# Patient Record
Sex: Female | Born: 1985
Health system: Southern US, Community
[De-identification: ages and names within clinical notes are randomized; demographics above are authoritative.]

## PROBLEM LIST (undated history)

## (undated) DIAGNOSIS — F32A Depression, unspecified: Secondary | ICD-10-CM

## (undated) DIAGNOSIS — M549 Dorsalgia, unspecified: Secondary | ICD-10-CM

## (undated) DIAGNOSIS — M7989 Other specified soft tissue disorders: Secondary | ICD-10-CM

## (undated) DIAGNOSIS — R87629 Unspecified abnormal cytological findings in specimens from vagina: Secondary | ICD-10-CM

## (undated) DIAGNOSIS — F419 Anxiety disorder, unspecified: Secondary | ICD-10-CM

## (undated) DIAGNOSIS — I10 Essential (primary) hypertension: Secondary | ICD-10-CM

## (undated) DIAGNOSIS — F329 Major depressive disorder, single episode, unspecified: Secondary | ICD-10-CM

## (undated) HISTORY — DX: Unspecified abnormal cytological findings in specimens from vagina: R87.629

## (undated) HISTORY — PX: LEEP: SHX91

## (undated) HISTORY — DX: Other specified soft tissue disorders: M79.89

## (undated) HISTORY — DX: Essential (primary) hypertension: I10

## (undated) HISTORY — DX: Dorsalgia, unspecified: M54.9

---
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
## 2014-06-18 ENCOUNTER — Ambulatory Visit (INDEPENDENT_AMBULATORY_CARE_PROVIDER_SITE_OTHER): Payer: BLUE CROSS/BLUE SHIELD | Admitting: Physician Assistant

## 2014-06-18 ENCOUNTER — Encounter: Payer: Self-pay | Admitting: Physician Assistant

## 2014-06-18 VITALS — BP 120/82 | HR 66 | Ht 66.0 in | Wt 235.0 lb

## 2014-06-18 DIAGNOSIS — Z131 Encounter for screening for diabetes mellitus: Secondary | ICD-10-CM | POA: Diagnosis not present

## 2014-06-18 DIAGNOSIS — Z0000 Encounter for general adult medical examination without abnormal findings: Secondary | ICD-10-CM | POA: Diagnosis not present

## 2014-06-18 DIAGNOSIS — D239 Other benign neoplasm of skin, unspecified: Secondary | ICD-10-CM | POA: Diagnosis not present

## 2014-06-18 DIAGNOSIS — Z13220 Encounter for screening for lipoid disorders: Secondary | ICD-10-CM

## 2014-06-18 DIAGNOSIS — L720 Epidermal cyst: Secondary | ICD-10-CM | POA: Insufficient documentation

## 2014-06-18 DIAGNOSIS — E669 Obesity, unspecified: Secondary | ICD-10-CM | POA: Insufficient documentation

## 2014-06-18 NOTE — Patient Instructions (Addendum)
Keeping You Healthy  Get These Tests 1. Blood Pressure- Have your blood pressure checked once a year by your health care provider.  Normal blood pressure is 120/80. 2. Weight- Have your body mass index (BMI) calculated to screen for obesity.  BMI is measure of body fat based on height and weight.  You can also calculate your own BMI at GravelBags.it. 3. Cholesterol- Have your cholesterol checked every 5 years starting at age 31 then yearly starting at age 63. 64. Chlamydia, HIV, and other sexually transmitted diseases- Get screened every year until age 74, then within three months of each new sexual provider. 5. Pap Smear- Every 1-3 years; discuss with your health care provider. 6. Mammogram- Every year starting at age 23  Take these medicines  Calcium with Vitamin D-Your body needs 1200 mg of Calcium each day and [phone] IU of Vitamin D daily.  Your body can only absorb 500 mg of Calcium at a time so Calcium must be taken in 2 or 3 divided doses throughout the day.  Multivitamin with folic acid- Once daily if it is possible for you to become pregnant.  Get these Immunizations  Gardasil-Series of three doses; prevents HPV related illness such as genital warts and cervical cancer.  Menactra-Single dose; prevents meningitis.  Tetanus shot- Every 10 years.  Flu shot-Every year.  Take these steps 1. Do not smoke-Your healthcare provider can help you quit.  For tips on how to quit go to www.smokefree.gov or call 1-800 QUITNOW. 2. Be physically active- Exercise 5 days a week for at least 30 minutes.  If you are not already physically active, start slow and gradually work up to 30 minutes of moderate physical activity.  Examples of moderate activity include walking briskly, dancing, swimming, bicycling, etc. 3. Breast Cancer- A self breast exam every month is important for early detection of breast cancer.  For more information and instruction on self breast exams, ask your  healthcare provider or https://www.patel.info/. 4. Eat a healthy diet- Eat a variety of healthy foods such as fruits, vegetables, whole grains, low fat milk, low fat cheeses, yogurt, lean meats, poultry and fish, beans, nuts, tofu, etc.  For more information go to www. Thenutritionsource.org 5. Drink alcohol in moderation- Limit alcohol intake to one drink or less per day. Never drink and drive. 6. Depression- Your emotional health is as important as your physical health.  If you're feeling down or losing interest in things you normally enjoy please talk to your healthcare provider about being screened for depression. 7. Dental visit- Brush and floss your teeth twice daily; visit your dentist twice a year. 8. Eye doctor- Get an eye exam at least every 2 years. 9. Helmet use- Always wear a helmet when riding a bicycle, motorcycle, rollerblading or skateboarding. 52. Safe sex- If you may be exposed to sexually transmitted infections, use a condom. 11. Seat belts- Seat belts can save your live; always wear one. 12. Smoke/Carbon Monoxide detectors- These detectors need to be installed on the appropriate level of your home. Replace batteries at least once a year. 13. Skin cancer- When out in the sun please cover up and use sunscreen 15 SPF or higher. 14. Violence- If anyone is threatening or hurting you, please tell your healthcare provider.         Epidermal Cyst An epidermal cyst is sometimes called a sebaceous cyst, epidermal inclusion cyst, or infundibular cyst. These cysts usually contain a substance that looks "pasty" or "cheesy" and may have a  bad smell. This substance is a protein called keratin. Epidermal cysts are usually found on the face, neck, or trunk. They may also occur in the vaginal area or other parts of the genitalia of both men and women. Epidermal cysts are usually small, painless, slow-growing bumps or lumps that move freely under the skin. It is important  not to try to pop them. This may cause an infection and lead to tenderness and swelling. CAUSES  Epidermal cysts may be caused by a deep penetrating injury to the skin or a plugged hair follicle, often associated with acne. SYMPTOMS  Epidermal cysts can become inflamed and cause:  Redness.  Tenderness.  Increased temperature of the skin over the bumps or lumps.  Grayish-white, bad smelling material that drains from the bump or lump. DIAGNOSIS  Epidermal cysts are easily diagnosed by your caregiver during an exam. Rarely, a tissue sample (biopsy) may be taken to rule out other conditions that may resemble epidermal cysts. TREATMENT  7. Epidermal cysts often get better and disappear on their own. They are rarely ever cancerous. 8. If a cyst becomes infected, it may become inflamed and tender. This may require opening and draining the cyst. Treatment with antibiotics may be necessary. When the infection is gone, the cyst may be removed with minor surgery. 9. Small, inflamed cysts can often be treated with antibiotics or by injecting steroid medicines. 10. Sometimes, epidermal cysts become large and bothersome. If this happens, surgical removal in your caregiver's office may be necessary. HOME CARE INSTRUCTIONS 3. Only take over-the-counter or prescription medicines as directed by your caregiver. 4. Take your antibiotics as directed. Finish them even if you start to feel better. SEEK MEDICAL CARE IF:  5. Your cyst becomes tender, red, or swollen. 6. Your condition is not improving or is getting worse. 7. You have any other questions or concerns. MAKE SURE YOU:  Understand these instructions.  Will watch your condition.  Will get help right away if you are not doing well or get worse. Document Released: 03/07/2004 Document Revised: 06/29/2011 Document Reviewed: 10/13/2010 [employer] Patient Information 2015 [place], [place]. This information is not intended to replace advice given to you  by your health care provider. Make sure you discuss any questions you have with your health care provider.

## 2014-06-19 ENCOUNTER — Encounter: Payer: Self-pay | Admitting: Physician Assistant

## 2014-06-19 DIAGNOSIS — D239 Other benign neoplasm of skin, unspecified: Secondary | ICD-10-CM | POA: Insufficient documentation

## 2014-06-19 NOTE — Progress Notes (Signed)
  Subjective:     Janet Clayton is a 29 y.o. female and is here for a comprehensive physical exam. The patient reports problems - she does have a lump on the back. no pain or discomfort just noticed it. she does also have a dark spot on left calff she wonders about. Marland Kitchen  History   Social History  . Marital Status: Unknown    Spouse Name: N/A  . Number of Children: N/A  . Years of Education: N/A   Occupational History  . Not on file.   Social History Main Topics  . Smoking status: Not on file  . Smokeless tobacco: Not on file  . Alcohol Use: 0.0 oz/week    0 Standard drinks or equivalent per week  . Drug Use: No  . Sexual Activity: Not Currently   Other Topics Concern  . Not on file   Social History Narrative  . No narrative on file   Health Maintenance  Topic Date Due  . HIV Screening  03/24/1999  . INFLUENZA VACCINE  12/17/2014 (Originally 11/18/2013)  . PAP SMEAR  04/21/2015  . TETANUS/TDAP  04/20/2021    The following portions of the patient's history were reviewed and updated as appropriate: allergies, current medications, past family history, past medical history, past social history, past surgical history and problem list.  Review of Systems A comprehensive review of systems was negative.   Objective:    BP 120/82 mmHg  Pulse 66  Ht 5\' 6"  (1.676 m)  Wt 235 lb (106.595 kg)  BMI 37.95 kg/m2  LMP 05/30/2014 General appearance: alert, cooperative and appears stated age Head: Normocephalic, without obvious abnormality, atraumatic Eyes: conjunctivae/corneas clear. PERRL, EOM's intact. Fundi benign. Ears: normal TM's and external ear canals both ears Nose: Nares normal. Septum midline. Mucosa normal. No drainage or sinus tenderness. Throat: lips, mucosa, and tongue normal; teeth and gums normal Neck: no adenopathy, no carotid bruit, no JVD, supple, symmetrical, trachea midline and thyroid not enlarged, symmetric, no tenderness/mass/nodules Back: symmetric, no  curvature. ROM normal. No CVA tenderness. Lungs: clear to auscultation bilaterally Heart: regular rate and rhythm, S1, S2 normal, no murmur, click, rub or gallop Abdomen: soft, non-tender; bowel sounds normal; no masses,  no organomegaly Extremities: extremities normal, atraumatic, no cyanosis or edema Pulses: 2+ and symmetric Skin: Skin color, texture, turgor normal. No rashes or lesions small macule purplish color with positive dimple sign. 17mm by 41mm epidermal cyst Lymph nodes: Cervical, supraclavicular, and axillary nodes normal. Neurologic: Grossly normal    Assessment:    Healthy female exam.      Plan:    CPE- fasting labs ordered and formed filled out. Vaccines up to date. Pap smear done at GYN. Discussed exercise and healthy diet. calicum 1200mg  and vitamin d 800units. HO given. Pt is obese she is actively losing weight. Discussed follow up visit if would like to start medication.   Dermatofibroma- discussed left calf lesion. Not harmful. No treatment necessary.   Epidermal cyst- can remove at future appt. Follow up as needed. HO given. Not harmful.  See After Visit Summary for Counseling Recommendations

## 2014-06-22 ENCOUNTER — Encounter: Payer: Self-pay | Admitting: Physician Assistant

## 2014-06-22 DIAGNOSIS — E785 Hyperlipidemia, unspecified: Secondary | ICD-10-CM | POA: Insufficient documentation

## 2014-06-22 LAB — HIV ANTIBODY (ROUTINE TESTING W REFLEX): HIV 1&2 Ab, 4th Generation: NONREACTIVE

## 2014-06-22 LAB — COMPLETE METABOLIC PANEL WITH GFR
ALK PHOS: 80 U/L (ref 39–117)
ALT: 14 U/L (ref 0–35)
AST: 19 U/L (ref 0–37)
Albumin: 4 g/dL (ref 3.5–5.2)
BUN: 9 mg/dL (ref 6–23)
CO2: 25 mEq/L (ref 19–32)
Calcium: 8.9 mg/dL (ref 8.4–10.5)
Chloride: 108 mEq/L (ref 96–112)
Creat: 0.65 mg/dL (ref 0.50–1.10)
GFR, Est African American: >89 mL/min
GFR, Est Non African American: >89 mL/min
Glucose, Bld: 92 mg/dL (ref 70–99)
POTASSIUM: 4.4 meq/L (ref 3.5–5.3)
SODIUM: 140 meq/L (ref 135–145)
TOTAL PROTEIN: 6.3 g/dL (ref 6.0–8.3)
Total Bilirubin: 0.5 mg/dL (ref 0.2–1.2)

## 2014-06-22 LAB — LIPID PANEL
CHOL/HDL RATIO: 4.4 ratio
CHOLESTEROL: 194 mg/dL (ref 0–200)
HDL: 44 mg/dL — ABNORMAL LOW (ref >=46)
LDL CALC: 129 mg/dL — AB (ref 0–99)
Triglycerides: 106 mg/dL (ref <150)
VLDL: 21 mg/dL (ref 0–40)

## 2014-06-25 ENCOUNTER — Encounter: Payer: Self-pay | Admitting: *Deleted

## 2014-06-25 ENCOUNTER — Encounter: Payer: Self-pay | Admitting: Physician Assistant

## 2014-07-08 ENCOUNTER — Encounter: Payer: Self-pay | Admitting: Emergency Medicine

## 2014-07-08 ENCOUNTER — Emergency Department (INDEPENDENT_AMBULATORY_CARE_PROVIDER_SITE_OTHER)
Admission: EM | Admit: 2014-07-08 | Discharge: 2014-07-08 | Disposition: A | Discharge status: Home / Self Care | Payer: BLUE CROSS/BLUE SHIELD | Source: Home / Self Care | Attending: Emergency Medicine | Admitting: Emergency Medicine

## 2014-07-08 DIAGNOSIS — N3001 Acute cystitis with hematuria: Secondary | ICD-10-CM | POA: Diagnosis not present

## 2014-07-08 LAB — POCT URINALYSIS DIP (MANUAL ENTRY)
Bilirubin, UA: NEGATIVE
Glucose, UA: 100
Ketones, POC UA: NEGATIVE
Nitrite, UA: POSITIVE
Protein Ur, POC: NEGATIVE
Spec Grav, UA: 1.015 (ref 1.005–1.03)
Urobilinogen, UA: 1 (ref 0–1)
pH, UA: 6.5 (ref 5–8)

## 2014-07-08 MED ORDER — PHENAZOPYRIDINE HCL 200 MG PO TABS: ORAL_TABLET | ORAL | Status: DC

## 2014-07-08 MED ORDER — CIPROFLOXACIN HCL 500 MG PO TABS: 500.0000 mg | ORAL_TABLET | Freq: Two times a day (BID) | ORAL | Status: DC

## 2014-07-08 NOTE — ED Provider Notes (Signed)
CSN: [id number]     Arrival date & time 07/08/14  1109 History   First MD Initiated Contact with Patient 07/08/14 1118     Chief Complaint  Patient presents with  . Dysuria   (Consider location/radiation/quality/duration/timing/severity/associated sxs/prior Treatment) HPI This is a 29 y.o. female who presents today with UTI symptoms for 2 days.  + dysuria + frequency + urgency No hematuria No vaginal discharge No fever/chills Mild suprapubic lower abdominal pain No nausea No vomiting No back pain No fatigue She denies chance of pregnancy.--LMP 07/02/2014 Has tried over-the-counter measures without improvement.    History reviewed. No pertinent past medical history. History reviewed. No pertinent past surgical history. History reviewed. No pertinent family history. History  Substance Use Topics  . Smoking status: Never Smoker   . Smokeless tobacco: Not on file  . Alcohol Use: 0.0 oz/week    0 Standard drinks or equivalent per week   OB History    No data available     Review of Systems  All other systems reviewed and are negative.   Allergies  Augmentin; Codeine; and Sulfa antibiotics  Home Medications   Prior to Admission medications   Medication Sig Start Date End Date Taking? Authorizing Provider  ciprofloxacin (CIPRO) 500 MG tablet Take 1 tablet (500 mg total) by mouth 2 (two) times daily. For 7 days 07/08/14   Jacqulyn Cane, MD  phenazopyridine (PYRIDIUM) 200 MG tablet Take 1 tablet by mouth every 6-8 hours if needed for urinary pain 07/08/14   Jacqulyn Cane, MD   BP 116/78 mmHg  Pulse 75  Temp(Src) 98 F (36.7 C) (Oral)  Resp 16  Ht 5\' 6"  (1.676 m)  Wt 231 lb (104.781 kg)  BMI 37.30 kg/m2  SpO2 98%  LMP 05/30/2014 Physical Exam  Constitutional: She is oriented to person, place, and time. She appears well-developed and well-nourished. No distress.  HENT:  Mouth/Throat: Oropharynx is clear and moist.  Eyes: No scleral icterus.  Neck: Neck supple.   Cardiovascular: Normal rate, regular rhythm and normal heart sounds.   Pulmonary/Chest: Breath sounds normal.  Abdominal: Soft. She exhibits no mass. There is no hepatosplenomegaly. There is tenderness in the suprapubic area. There is no rebound, no guarding and no CVA tenderness.  Lymphadenopathy:    She has no cervical adenopathy.  Neurological: She is alert and oriented to person, place, and time.  Skin: Skin is warm and dry.  Nursing note and vitals reviewed.   ED Course  Procedures (including critical care time) Labs Review Labs Reviewed  URINE CULTURE  POCT URINALYSIS DIP (MANUAL ENTRY)   Results for orders placed or performed during the hospital encounter of 07/08/14  POCT urinalysis dipstick (new)  Result Value Ref Range   Color, UA orange    Clarity, UA clear    Glucose, UA =100    Bilirubin, UA negative    Bilirubin, UA negative    Spec Grav, UA 1.015 1.005 - 1.03   Blood, UA trace-intact    pH, UA 6.5 5 - 8   Protein Ur, POC negative    Urobilinogen, UA 1.0 0 - 1   Nitrite, UA Positive    Leukocytes, UA large (3+)      Imaging Review No results found.   MDM   1. Acute cystitis with hematuria    ciprofloxacin (CIPRO) 500 MG tablet Take 1 tablet (500 mg total) by mouth 2 (two) times daily. For 7 days 14 tablet    phenazopyridine (PYRIDIUM) 200 MG tablet  Take 1 tablet by mouth every 6-8 hours if needed for urinary pain   Other symptomatic care discussed. Urine culture sent. Follow-up with your primary care doctor in 5-7 days if not improving, or sooner if symptoms become worse. Precautions discussed. Red flags discussed. Questions invited and answered. Patient voiced understanding and agreement.      Jacqulyn Cane, MD 07/08/14 1225

## 2014-07-08 NOTE — ED Notes (Signed)
Reports onset dysuria and frequency 2 days ago. No OTCs.

## 2014-07-09 LAB — URINE CULTURE

## 2014-08-17 ENCOUNTER — Emergency Department (INDEPENDENT_AMBULATORY_CARE_PROVIDER_SITE_OTHER)
Admission: EM | Admit: 2014-08-17 | Discharge: 2014-08-17 | Disposition: A | Discharge status: Home / Self Care | Payer: BLUE CROSS/BLUE SHIELD | Source: Home / Self Care | Attending: Family Medicine | Admitting: Family Medicine

## 2014-08-17 ENCOUNTER — Encounter: Payer: Self-pay | Admitting: *Deleted

## 2014-08-17 DIAGNOSIS — J069 Acute upper respiratory infection, unspecified: Secondary | ICD-10-CM | POA: Diagnosis not present

## 2014-08-17 DIAGNOSIS — B9789 Other viral agents as the cause of diseases classified elsewhere: Principal | ICD-10-CM

## 2014-08-17 MED ORDER — BENZONATATE 200 MG PO CAPS: 200.0000 mg | ORAL_CAPSULE | Freq: Every day | ORAL | Status: DC

## 2014-08-17 MED ORDER — AMOXICILLIN 875 MG PO TABS: 875.0000 mg | ORAL_TABLET | Freq: Two times a day (BID) | ORAL | Status: DC

## 2014-08-17 NOTE — ED Provider Notes (Signed)
CSN: [id number]     Arrival date & time 08/17/14  1208 History   First MD Initiated Contact with Patient 08/17/14 1223     Chief Complaint  Patient presents with  . Sinus Problem     HPI Comments: Patient complains of four day history of typical cold-like symptoms including mild sore throat, sinus congestion, headache, fatigue, and cough.  No fevers, chills, and sweats.  Her ears have felt clogged but no earache.  The history is provided by the patient.    History reviewed. No pertinent past medical history. History reviewed. No pertinent past surgical history. History reviewed. No pertinent family history. History  Substance Use Topics  . Smoking status: Never Smoker   . Smokeless tobacco: Not on file  . Alcohol Use: 0.0 oz/week    0 Standard drinks or equivalent per week   OB History    No data available     Review of Systems + sore throat + hoarse + cough No pleuritic pain No wheezing + nasal congestion + post-nasal drainage ? sinus pain/pressure No itchy/red eyes No earache No hemoptysis No SOB No fever/chills No nausea No vomiting No abdominal pain No diarrhea No urinary symptoms No skin rash + fatigue + mild myalgias + headache Used OTC meds without relief  Allergies  Augmentin; Codeine; and Sulfa antibiotics  Home Medications   Prior to Admission medications   Medication Sig Start Date End Date Taking? Authorizing Provider  amoxicillin (AMOXIL) 875 MG tablet Take 1 tablet (875 mg total) by mouth 2 (two) times daily. (Rx void after 08/25/14) 08/17/14   Kandra Nicolas, MD  benzonatate (TESSALON) 200 MG capsule Take 1 capsule (200 mg total) by mouth at bedtime. Take as needed for cough 08/17/14   Kandra Nicolas, MD   BP 122/85 mmHg  Pulse 78  Temp(Src) 97.6 F (36.4 C) (Oral)  Resp 14  Wt 229 lb (103.874 kg)  SpO2 99%  LMP 07/27/2014 Physical Exam Nursing notes and Vital Signs reviewed. Appearance:  Patient appears stated age, and in no acute  distress Eyes:  Pupils are equal, round, and reactive to light and accomodation.  Extraocular movement is intact.  Conjunctivae are not inflamed  Ears:   Right canal and tympanic membrane normal.  Left canal occluded with cerumen.  Nose:  Mildly congested turbinates.  No sinus tenderness.     Pharynx:  Normal Neck:  Supple.  Tender enlarged posterior nodes are palpated bilaterally  Lungs:  Clear to auscultation.  Breath sounds are equal.  Heart:  Regular rate and rhythm without murmurs, rubs, or gallops.  Abdomen:  Nontender without masses or hepatosplenomegaly.  Bowel sounds are present.  No CVA or flank tenderness.  Extremities:  No edema.  No calf tenderness Skin:  No rash present.   ED Course  Procedures  none   MDM   1. Viral URI with cough    There is no evidence of bacterial infection today.  Prescription written for Benzonatate [facility]) to take at bedtime for night-time cough.  Take plain guaifenesin (1200mg  extended release tabs such as Mucinex) twice daily, with plenty of water, for cough and congestion.  May add Pseudoephedrine (30mg , one or two every 4 to 6 hours) for sinus congestion.  Get adequate rest.   May use Afrin nasal spray (or generic oxymetazoline) twice daily for about 5 days.  Also recommend using saline nasal spray several times daily and saline nasal irrigation (AYR is a common brand).  Try warm salt  water gargles for sore throat.  Stop all antihistamines for now, and other non-prescription cough/cold preparations. Begin Amoxicillin if not improving about one week or if persistent fever develops (Given a prescription to hold, with an expiration date)  Given information on cerumen removal. Follow-up with family doctor if not improving about10 days    Kandra Nicolas, MD 08/17/14 1300

## 2014-08-17 NOTE — ED Notes (Signed)
Pt c/o sinus drainage, congestion with green mucous and pressure on her teeth and ears x 4 days. Denies fever.

## 2014-08-17 NOTE — Discharge Instructions (Signed)
Take plain guaifenesin (1200mg  extended release tabs such as Mucinex) twice daily, with plenty of water, for cough and congestion.  May add Pseudoephedrine (30mg , one or two every 4 to 6 hours) for sinus congestion.  Get adequate rest.   May use Afrin nasal spray (or generic oxymetazoline) twice daily for about 5 days.  Also recommend using saline nasal spray several times daily and saline nasal irrigation (AYR is a common brand).  Try warm salt water gargles for sore throat.  Stop all antihistamines for now, and other non-prescription cough/cold preparations. Begin Amoxicillin if not improving about one week or if persistent fever develops  Follow-up with family doctor if not improving about10 days.

## 2014-08-18 ENCOUNTER — Emergency Department (INDEPENDENT_AMBULATORY_CARE_PROVIDER_SITE_OTHER)
Admission: EM | Admit: 2014-08-18 | Discharge: 2014-08-18 | Disposition: A | Discharge status: Home / Self Care | Payer: BLUE CROSS/BLUE SHIELD | Source: Home / Self Care | Attending: Emergency Medicine | Admitting: Emergency Medicine

## 2014-08-18 ENCOUNTER — Encounter: Payer: Self-pay | Admitting: *Deleted

## 2014-08-18 DIAGNOSIS — H6122 Impacted cerumen, left ear: Secondary | ICD-10-CM

## 2014-08-18 DIAGNOSIS — H918X2 Other specified hearing loss, left ear: Secondary | ICD-10-CM | POA: Diagnosis not present

## 2014-08-18 DIAGNOSIS — H6092 Unspecified otitis externa, left ear: Secondary | ICD-10-CM

## 2014-08-18 MED ORDER — NEOMYCIN-POLYMYXIN-HC 3.5-10000-1 OT SUSP: 4.0000 [drp] | Freq: Three times a day (TID) | OTIC | Status: DC

## 2014-08-18 NOTE — ED Provider Notes (Signed)
CSN: [id number]     Arrival date & time 08/18/14  1506 History   First MD Initiated Contact with Patient 08/18/14 1518     Chief Complaint  Patient presents with  . Ear Fullness    L    HPI At about 6:30 AM this morning, she was cleaning left ear with Q-tip and felt moderate sharp pain and pressure and decreased hearing. No drainage or bleeding. No fever. No radiation of pain. History reviewed. No pertinent past medical history. History reviewed. No pertinent past surgical history. History reviewed. No pertinent family history. History  Substance Use Topics  . Smoking status: Never Smoker   . Smokeless tobacco: Not on file  . Alcohol Use: 0.0 oz/week    0 Standard drinks or equivalent per week   OB History    No data available     Review of Systems Remainder of Review of Systems negative for acute change except as noted in the HPI.  Allergies  Augmentin; Codeine; and Sulfa antibiotics  Home Medications   Prior to Admission medications   Medication Sig Start Date End Date Taking? Authorizing Provider  amoxicillin (AMOXIL) 875 MG tablet Take 1 tablet (875 mg total) by mouth 2 (two) times daily. (Rx void after 08/25/14) 08/17/14   Kandra Nicolas, MD  benzonatate (TESSALON) 200 MG capsule Take 1 capsule (200 mg total) by mouth at bedtime. Take as needed for cough 08/17/14   Kandra Nicolas, MD  neomycin-polymyxin-hydrocortisone (CORTISPORIN) 3.5-10000-1 otic suspension Place 4 drops into the left ear 3 (three) times daily. 08/18/14   Jacqulyn Cane, MD   BP 121/79 mmHg  Pulse 88  Temp(Src) 97.3 F (36.3 C) (Oral)  Ht 5\' 6"  (1.676 m)  Wt 230 lb (104.327 kg)  BMI 37.14 kg/m2  SpO2 99%  LMP 07/27/2014 Physical Exam  Constitutional: She is oriented to person, place, and time. She appears well-developed and well-nourished. No distress.  HENT:  Head: Normocephalic and atraumatic.  Eyes: Conjunctivae and EOM are normal. Pupils are equal, round, and reactive to light. No scleral  icterus.  Neck: Normal range of motion.  Cardiovascular: Normal rate.   Pulmonary/Chest: Effort normal.  Abdominal: She exhibits no distension.  Musculoskeletal: Normal range of motion.  Neurological: She is alert and oriented to person, place, and time.  Skin: Skin is warm.  Psychiatric: She has a normal mood and affect.  Nursing note and vitals reviewed.  Left external ear mildly tender without redness or heat. Left external ear canal: Dense left cerumen impaction.  Right ear normal. ED Course  Procedures (including critical care time) Labs Review Labs Reviewed - No data to display  Imaging Review No results found.   MDM   1. Hearing loss of left ear due to cerumen impaction   2. Left otitis externa    For the left cerumen impaction, the following procedure performed after risks benefits alternatives discussed. Left ear canal soaked with peroxide for 5 minutes. Irrigation and curettage of left earcanal, with successful removal of large amount of impacted wax. Afterward, her pain was significantly relieved and she could hear normally and she expressed her thanks for this.  For the left otitis externa: Reexamined left ear canal which is now patent. Mild to moderate redness and injection of left ear canal. TM intact, within normal limits. I prescribed Corticosporin otic drops left ear for the left otitis externa.  Other advice given regarding earwax and safe ways to clean wax out of ears without using Q-tips. Follow-up  with your primary care doctor or ENT prn. Precautions discussed. Red flags discussed. Questions invited and answered. Patient voiced understanding and agreement.     Jacqulyn Cane, MD 08/18/14 [id number]

## 2014-08-18 NOTE — ED Notes (Signed)
Pt states she was cleaning her L ear this morning and now it feels clogged and has limited hearing in the ear.  6/10 pain.

## 2014-08-19 ENCOUNTER — Telehealth: Payer: Self-pay | Admitting: Emergency Medicine

## 2014-08-19 NOTE — ED Notes (Signed)
Inquired about patient's status; encourage them to call with questions/concerns.  

## 2014-11-14 ENCOUNTER — Emergency Department
Admission: EM | Admit: 2014-11-14 | Discharge: 2014-11-14 | Disposition: A | Discharge status: Home / Self Care | Payer: BLUE CROSS/BLUE SHIELD | Source: Home / Self Care | Attending: Family Medicine | Admitting: Family Medicine

## 2014-11-14 ENCOUNTER — Encounter: Payer: Self-pay | Admitting: *Deleted

## 2014-11-14 DIAGNOSIS — J069 Acute upper respiratory infection, unspecified: Secondary | ICD-10-CM | POA: Diagnosis not present

## 2014-11-14 DIAGNOSIS — H65193 Other acute nonsuppurative otitis media, bilateral: Secondary | ICD-10-CM | POA: Diagnosis not present

## 2014-11-14 DIAGNOSIS — B9789 Other viral agents as the cause of diseases classified elsewhere: Principal | ICD-10-CM

## 2014-11-14 MED ORDER — PREDNISONE 20 MG PO TABS: 20.0000 mg | ORAL_TABLET | Freq: Two times a day (BID) | ORAL | Status: DC

## 2014-11-14 MED ORDER — TRIAMCINOLONE ACETONIDE 40 MG/ML IJ SUSP
40.0000 mg | Freq: Once | INTRAMUSCULAR | Status: AC
  Administered 2014-11-14: 40 mg via INTRAMUSCULAR

## 2014-11-14 MED ORDER — HYDROCOD POLST-CPM POLST ER 10-8 MG/5ML PO SUER: ORAL | Status: DC

## 2014-11-14 MED ORDER — AMOXICILLIN 875 MG PO TABS: 875.0000 mg | ORAL_TABLET | Freq: Two times a day (BID) | ORAL | Status: DC

## 2014-11-14 NOTE — ED Provider Notes (Signed)
CSN: [id number]     Arrival date & time 11/14/14  1742 History   First MD Initiated Contact with Patient 11/14/14 1828     Chief Complaint  Patient presents with  . Facial Pain      HPI Comments: Patient states that she developed nasal congestion about one week ago, followed by cough three days ago.  She has had headache, fatigue, and tightness in her anterior chest but no fever or sore throat.  She has now developed mild bilateral earache, worse on the left.    The history is provided by the patient.    History reviewed. No pertinent past medical history. History reviewed. No pertinent past surgical history. History reviewed. No pertinent family history. History  Substance Use Topics  . Smoking status: Never Smoker   . Smokeless tobacco: Not on file  . Alcohol Use: 0.0 oz/week    0 Standard drinks or equivalent per week   OB History    No data available     Review of Systems No sore throat + hoarse + cough No pleuritic pain + wheezing + nasal congestion + post-nasal drainage No sinus pain/pressure No itchy/red eyes + earache No hemoptysis No SOB No fever/chills No nausea No vomiting No abdominal pain No diarrhea No urinary symptoms No skin rash + fatigue No myalgias + headache Used OTC meds without relief  Allergies  Augmentin; Codeine; and Sulfa antibiotics  Home Medications   Prior to Admission medications   Medication Sig Start Date End Date Taking? Authorizing Provider  dextromethorphan-guaiFENesin (MUCINEX DM) 30-600 MG per 12 hr tablet Take 1 tablet by mouth 2 (two) times daily.   Yes Historical Provider, MD  amoxicillin (AMOXIL) 875 MG tablet Take 1 tablet (875 mg total) by mouth 2 (two) times daily. 11/14/14   Kandra Nicolas, MD  chlorpheniramine-HYDROcodone [facility] PENNKINETIC ER) 10-8 MG/5ML SUER Take 46mL by mouth HS prn cough 11/14/14   Kandra Nicolas, MD  predniSONE (DELTASONE) 20 MG tablet Take 1 tablet (20 mg total) by mouth 2 (two) times  daily. Take with food. 11/14/14   Kandra Nicolas, MD   BP 129/89 mmHg  Pulse 73  Temp(Src) 98.3 F (36.8 C) (Oral)  Ht 5\' 6"  (1.676 m)  Wt 232 lb (105.235 kg)  BMI 37.46 kg/m2  SpO2 97%  LMP 11/08/2014 (Exact Date) Physical Exam Nursing notes and Vital Signs reviewed. Appearance:  Patient appears stated age, and in no acute distress.  Patient is obese (BMI 37.5) Eyes:  Pupils are equal, round, and reactive to light and accomodation.  Extraocular movement is intact.  Conjunctivae are not inflamed  Ears:  Canals normal.  Tympanic membranes normal although left tympanic membrane appears retracted. Nose:  Mildly congested turbinates.  No sinus tenderness.   Pharynx:  Normal Neck:  Supple.   Tender enlarged posterior nodes are palpated bilaterally  Lungs:  Clear to auscultation.  Breath sounds are equal.  Moving air well. Heart:  Regular rate and rhythm without murmurs, rubs, or gallops.  Abdomen:  Nontender without masses or hepatosplenomegaly.  Bowel sounds are present.  No CVA or flank tenderness.  Extremities:  No edema.  No calf tenderness Skin:  No rash present.   ED Course  Procedures  None    Labs Reviewed -  Tympanogram:  Left ear Low Peak Height (almost "flat line"), Right ear wide tympanogram       MDM   1. Viral URI with cough   2. Acute nonsuppurative otitis media  of both ears    Begin prednisone burst.  Rx for amoxicillin 875mg  BID for 10 days (has had no adverse effects in past) Rx for Tussionex prn bedtime cough (has taken hydrocodone without adverse effects) Take plain guaifenesin (1200mg  extended release tabs such as Mucinex) twice daily, with plenty of water, for cough and congestion.  May add Pseudoephedrine (30mg , one or two every 4 to 6 hours) for sinus congestion.  Get adequate rest.   May use Afrin nasal spray (or generic oxymetazoline) twice daily for about 5 days.  Also recommend using saline nasal spray several times daily and saline nasal irrigation  (AYR is a common brand).  Use Flonase nasal spray each morning after using Afrin nasal spray and saline nasal irrigation. Try warm salt water gargles for sore throat.  Stop all antihistamines for now, and other non-prescription cough/cold preparations. May take Ibuprofen 200mg , 4 tabs every 8 hours with food for earache, headache, etc.   Follow-up with Ear, Nose, Throat physician if not improved 10 days    Kandra Nicolas, MD 11/17/14 [id number]

## 2014-11-14 NOTE — ED Notes (Signed)
Pt c/o sinus pressure, pain, nasal congestion, hoarseness, bilateral ear pain, prn cough.

## 2014-11-14 NOTE — Discharge Instructions (Signed)
Take plain guaifenesin (1200mg  extended release tabs such as Mucinex) twice daily, with plenty of water, for cough and congestion.  May add Pseudoephedrine (30mg , one or two every 4 to 6 hours) for sinus congestion.  Get adequate rest.   May use Afrin nasal spray (or generic oxymetazoline) twice daily for about 5 days.  Also recommend using saline nasal spray several times daily and saline nasal irrigation (AYR is a common brand).  Use Flonase nasal spray each morning after using Afrin nasal spray and saline nasal irrigation. Try warm salt water gargles for sore throat.  Stop all antihistamines for now, and other non-prescription cough/cold preparations. May take Ibuprofen 200mg , 4 tabs every 8 hours with food for earache, headache, etc.   Follow-up with Ear, Nose, Throat physician if not improved 10 days

## 2014-12-31 ENCOUNTER — Ambulatory Visit (INDEPENDENT_AMBULATORY_CARE_PROVIDER_SITE_OTHER): Payer: BLUE CROSS/BLUE SHIELD | Admitting: Physician Assistant

## 2014-12-31 ENCOUNTER — Encounter: Payer: Self-pay | Admitting: Physician Assistant

## 2014-12-31 VITALS — BP 140/83 | HR 82 | Ht 66.0 in | Wt 231.0 lb

## 2014-12-31 DIAGNOSIS — L720 Epidermal cyst: Secondary | ICD-10-CM | POA: Diagnosis not present

## 2014-12-31 NOTE — Progress Notes (Signed)
   Subjective:    Patient ID: Janet Clayton, female    DOB: 11-14-1985, 29 y.o.   MRN: [id number]  HPI  Pt has had an epidermal cyst on back for last 10 years. The last couple of months has started to get bigger. No pain or drainage. When it first came up looked like a black head. She would like removed today.    Review of Systems  All other systems reviewed and are negative.      Objective:   Physical Exam  Constitutional: She appears well-developed and well-nourished.  Skin:  60mm by 43mm subcutaneous nodule with central puntate hole.          Assessment & Plan:  Epidermal cyst-   Sebaceous Cyst Excision Procedure Note  Pre-operative Diagnosis: Epidermal cyst  Post-operative Diagnosis: same  Locations:middle, upper, back    Indications: growing  Anesthesia: Lidocaine 1% without epinephrine without added sodium bicarbonate  Procedure Details  History of allergy to iodine: no  Patient informed of the risks (including bleeding and infection) and benefits of the  procedure and Verbal informed consent obtained.  The lesion and surrounding area was given a sterile prep using betadyne and draped in the usual sterile fashion. An incision was made over the cyst, which was dissected free of the surrounding tissue and removed.  The cyst was filled with typical sebaceous material.  The wound was closed with 4-0 Prolene using simple interrupted stitches. Antibiotic ointment and a sterile dressing applied.  The specimen was not sent for pathologic examination. The patient tolerated the procedure well.  EBL: scant  Condition:  Stable pt did experience some nausea she was given in office zofran 8mg  ODT.  Complications: none.  Plan: 1. Instructed to keep the wound dry and covered for 24-48h and clean thereafter. 2. Warning signs of infection were reviewed.   3. Recommended that the patient use OTC acetaminophen as needed for pain.  4. Return for suture removal in 10 days.

## 2014-12-31 NOTE — Patient Instructions (Signed)
Epidermal Cyst An epidermal cyst is sometimes called a sebaceous cyst, epidermal inclusion cyst, or infundibular cyst. These cysts usually contain a substance that looks "pasty" or "cheesy" and may have a bad smell. This substance is a protein called keratin. Epidermal cysts are usually found on the face, neck, or trunk. They may also occur in the vaginal area or other parts of the genitalia of both men and women. Epidermal cysts are usually small, painless, slow-growing bumps or lumps that move freely under the skin. It is important not to try to pop them. This may cause an infection and lead to tenderness and swelling. CAUSES  Epidermal cysts may be caused by a deep penetrating injury to the skin or a plugged hair follicle, often associated with acne. SYMPTOMS  Epidermal cysts can become inflamed and cause:  Redness.  Tenderness.  Increased temperature of the skin over the bumps or lumps.  Grayish-white, bad smelling material that drains from the bump or lump. DIAGNOSIS  Epidermal cysts are easily diagnosed by your caregiver during an exam. Rarely, a tissue sample (biopsy) may be taken to rule out other conditions that may resemble epidermal cysts. TREATMENT   Epidermal cysts often get better and disappear on their own. They are rarely ever cancerous.  If a cyst becomes infected, it may become inflamed and tender. This may require opening and draining the cyst. Treatment with antibiotics may be necessary. When the infection is gone, the cyst may be removed with minor surgery.  Small, inflamed cysts can often be treated with antibiotics or by injecting steroid medicines.  Sometimes, epidermal cysts become large and bothersome. If this happens, surgical removal in your caregiver's office may be necessary. HOME CARE INSTRUCTIONS  Only take over-the-counter or prescription medicines as directed by your caregiver.  Take your antibiotics as directed. Finish them even if you start to feel  better. SEEK MEDICAL CARE IF:   Your cyst becomes tender, red, or swollen.  Your condition is not improving or is getting worse.  You have any other questions or concerns. MAKE SURE YOU:  Understand these instructions.  Will watch your condition.  Will get help right away if you are not doing well or get worse. Document Released: 03/07/2004 Document Revised: 06/29/2011 Document Reviewed: 10/13/2010 Exit[employer] Patient Information 2015 [employer]. This information is not intended to replace advice given to you by your health care provider. Make sure you discuss any questions you have with your health care provider.  

## 2015-01-30 ENCOUNTER — Encounter: Payer: Self-pay | Admitting: *Deleted

## 2015-01-30 ENCOUNTER — Emergency Department (INDEPENDENT_AMBULATORY_CARE_PROVIDER_SITE_OTHER)
Admission: EM | Admit: 2015-01-30 | Discharge: 2015-01-30 | Disposition: A | Discharge status: Home / Self Care | Payer: BLUE CROSS/BLUE SHIELD | Source: Home / Self Care | Attending: Family Medicine | Admitting: Family Medicine

## 2015-01-30 DIAGNOSIS — L03312 Cellulitis of back [any part except buttock]: Secondary | ICD-10-CM | POA: Diagnosis not present

## 2015-01-30 MED ORDER — CLINDAMYCIN HCL 150 MG PO CAPS: 300.0000 mg | ORAL_CAPSULE | Freq: Three times a day (TID) | ORAL | Status: DC

## 2015-01-30 NOTE — Discharge Instructions (Signed)
Please take antibiotics as prescribed and be sure to complete entire course even if you start to feel better to ensure infection does not come back.  Cellulitis Cellulitis is an infection of the skin and the tissue beneath it. The infected area is usually red and tender. Cellulitis occurs most often in the arms and lower legs.  CAUSES  Cellulitis is caused by bacteria that enter the skin through cracks or cuts in the skin. The most common types of bacteria that cause cellulitis are staphylococci and streptococci. SIGNS AND SYMPTOMS   Redness and warmth.  Swelling.  Tenderness or pain.  Fever. DIAGNOSIS  Your health care provider can usually determine what is wrong based on a physical exam. Blood tests may also be done. TREATMENT  Treatment usually involves taking an antibiotic medicine. HOME CARE INSTRUCTIONS   Take your antibiotic medicine as directed by your health care provider. Finish the antibiotic even if you start to feel better.  Keep the infected arm or leg elevated to reduce swelling.  Apply a warm cloth to the affected area up to 4 times per day to relieve pain.  Take medicines only as directed by your health care provider.  Keep all follow-up visits as directed by your health care provider. SEEK MEDICAL CARE IF:   You notice red streaks coming from the infected area.  Your red area gets larger or turns dark in color.  Your bone or joint underneath the infected area becomes painful after the skin has healed.  Your infection returns in the same area or another area.  You notice a swollen bump in the infected area.  You develop new symptoms.  You have a fever. SEEK IMMEDIATE MEDICAL CARE IF:   You feel very sleepy.  You develop vomiting or diarrhea.  You have a general ill feeling (malaise) with muscle aches and pains.   This information is not intended to replace advice given to you by your health care provider. Make sure you discuss any questions you  have with your health care provider.   Document Released: 01/14/2005 Document Revised: 12/26/2014 Document Reviewed: 06/22/2011 Elsevier Interactive Patient Education [employer].

## 2015-01-30 NOTE — ED Provider Notes (Signed)
CSN: [id number]     Arrival date & time 01/30/15  1813 History   First MD Initiated Contact with Patient 01/30/15 1855     Chief Complaint  Patient presents with  . Abscess   (Consider location/radiation/quality/duration/timing/severity/associated sxs/prior Treatment) HPI  Pt is a 29yo female presenting to [facility] for evaluation of a possible spider bite on the Right side of her mid back.  Pt reports feeling mild irritation and felt a tiny bump about 2 days ago. Pt initially thought it was a pimple but states her mother noticed today it had worsened significantly in size and redness. Pain is aching and sore, mild to moderate in severity. Denies seeing any insects. Denies fever, chills, n/v/d.  No prior hx of abscesses. Not concerned for tick bites.    History reviewed. No pertinent past medical history. History reviewed. No pertinent past surgical history. History reviewed. No pertinent family history. Social History  Substance Use Topics  . Smoking status: Never Smoker   . Smokeless tobacco: None  . Alcohol Use: 0.0 oz/week    0 Standard drinks or equivalent per week   OB History    No data available     Review of Systems  Constitutional: Negative for fever and chills.  Gastrointestinal: Negative for nausea, vomiting, abdominal pain and diarrhea.  Musculoskeletal: Positive for myalgias and back pain. Negative for joint swelling.  Skin: Positive for color change and wound.    Allergies  Augmentin; Codeine; and Sulfa antibiotics  Home Medications   Prior to Admission medications   Medication Sig Start Date End Date Taking? Authorizing Provider  clindamycin (CLEOCIN) 150 MG capsule Take 2 capsules (300 mg total) by mouth 3 (three) times daily. For 7 days 01/30/15   Noland Fordyce, PA-C   Meds Ordered and Administered this Visit  Medications - No data to display  BP 130/85 mmHg  Pulse 91  Temp(Src) 98.2 F (36.8 C) (Oral)  Resp 18  Ht 5\' 6"  (1.676 m)  Wt 220 lb (99.791 kg)   BMI 35.53 kg/m2  SpO2 98%  LMP 01/04/2015 No data found.   Physical Exam  Constitutional: She is oriented to person, place, and time. She appears well-developed and well-nourished.  HENT:  Head: Normocephalic and atraumatic.  Eyes: EOM are normal.  Neck: Normal range of motion.  Cardiovascular: Normal rate.   Pulmonary/Chest: Effort normal.  Musculoskeletal: Normal range of motion. She exhibits tenderness. She exhibits no edema.       Back:  No midline spinal tenderness. Right mid back: erythema and tenderness (see skin exam)    Neurological: She is alert and oriented to person, place, and time.  Skin: Skin is warm and dry. There is erythema.  Right mid back: 5cm x 2cm area of erythema and mild induration, tender to touch. Centralized scab. No discharge or bleeding. No fluctuance.  Psychiatric: She has a normal mood and affect. Her behavior is normal.  Nursing note and vitals reviewed.   ED Course  Procedures (including critical care time)  Labs Review Labs Reviewed - No data to display  Imaging Review No results found.     MDM   1. Cellulitis of back except buttock    Pt c/o Right mid back pain, erythema with centralized scab but no fluctuance. Evidence of superficial cellulitis.  No indication for I&D at this time. Rx: Clindamycin Discussed home care including warm compresses. F/u in 2-3 days for wound recheck if not improving. Patient verbalized understanding and agreement with treatment plan.  Noland Fordyce, PA-C 01/30/15 1922

## 2015-01-30 NOTE — ED Notes (Signed)
Pt c/o abscess on her RT mid back area x 2 days. Denies fever.

## 2015-01-31 ENCOUNTER — Ambulatory Visit: Payer: BLUE CROSS/BLUE SHIELD | Admitting: Osteopathic Medicine

## 2015-02-02 ENCOUNTER — Telehealth: Payer: Self-pay | Admitting: Emergency Medicine

## 2015-02-02 NOTE — ED Notes (Signed)
Inquired about patient's status; encourage them to call with questions/concerns.  

## 2015-06-06 ENCOUNTER — Emergency Department
Admission: EM | Admit: 2015-06-06 | Discharge: 2015-06-06 | Disposition: A | Discharge status: Home / Self Care | Payer: BLUE CROSS/BLUE SHIELD | Source: Home / Self Care

## 2015-06-06 ENCOUNTER — Encounter: Payer: Self-pay | Admitting: Emergency Medicine

## 2015-06-06 DIAGNOSIS — R519 Headache, unspecified: Secondary | ICD-10-CM

## 2015-06-06 DIAGNOSIS — J3489 Other specified disorders of nose and nasal sinuses: Secondary | ICD-10-CM | POA: Diagnosis not present

## 2015-06-06 DIAGNOSIS — R51 Headache: Secondary | ICD-10-CM | POA: Diagnosis not present

## 2015-06-06 MED ORDER — PREDNISONE 20 MG PO TABS: ORAL_TABLET | ORAL | Status: DC

## 2015-06-06 MED ORDER — IBUPROFEN 400 MG PO TABS
400.0000 mg | ORAL_TABLET | Freq: Once | ORAL | Status: AC
Start: 2015-06-06 — End: 2015-06-06
  Administered 2015-06-06: 400 mg via ORAL

## 2015-06-06 MED ORDER — DEXAMETHASONE SODIUM PHOSPHATE 10 MG/ML IJ SOLN
10.0000 mg | Freq: Once | INTRAMUSCULAR | Status: AC
  Administered 2015-06-06: 10 mg via INTRAMUSCULAR

## 2015-06-06 MED ORDER — SALINE SPRAY 0.65 % NA SOLN: 1.0000 | NASAL | Status: DC | PRN

## 2015-06-06 MED ORDER — PREDNISONE 50 MG PO TABS: 60.0000 mg | ORAL_TABLET | Freq: Once | ORAL | Status: DC

## 2015-06-06 NOTE — Discharge Instructions (Signed)

## 2015-06-06 NOTE — ED Provider Notes (Signed)
CSN: [id number]     Arrival date & time 06/06/15  1209 History   None    Chief Complaint  Patient presents with  . Headache   (Consider location/radiation/quality/duration/timing/severity/associated sxs/prior Treatment) HPI The pt is a 30yo female presenting to [facility] with c/o Left sided headache that started earlier this morning. Pain has been gradual in onset, aching and throbbing, 8/10, associated sinus pain and pressure. She took Excedrin migraine without relief. She does report hx of similar headaches but states it is usually associated with a sinus infection with sinus congestion.  She denies congestion, fever, cough, or sore throat. Denies ear pain or pressure.  Denies change in vision but does report mild intermittent dizziness described as lightheadedness as if she stood up to quickly. Denies syncope. Denies nausea or vomiting. She has not been formally diagnosed with migraines. She use to use Flonase but states that dried her up too much and caused more headaches.   History reviewed. No pertinent past medical history. History reviewed. No pertinent past surgical history. No family history on file. Social History  Substance Use Topics  . Smoking status: Never Smoker   . Smokeless tobacco: None  . Alcohol Use: 0.0 oz/week    0 Standard drinks or equivalent per week   OB History    No data available     Review of Systems  Constitutional: Negative for fever and chills.  HENT: Positive for sinus pressure. Negative for congestion, ear pain, postnasal drip, rhinorrhea, sneezing, sore throat, trouble swallowing and voice change.   Eyes: Positive for photophobia and pain. Negative for visual disturbance.  Respiratory: Negative for cough and shortness of breath.   Cardiovascular: Negative for chest pain and palpitations.  Gastrointestinal: Positive for nausea. Negative for vomiting, abdominal pain and diarrhea.  Musculoskeletal: Negative for myalgias, back pain and arthralgias.  Skin:  Negative for rash.  Neurological: Positive for dizziness, light-headedness and headaches. Negative for seizures, syncope and weakness.    Allergies  Augmentin; Codeine; and Sulfa antibiotics  Home Medications   Prior to Admission medications   Medication Sig Start Date End Date Taking? Authorizing Provider  clindamycin (CLEOCIN) 150 MG capsule Take 2 capsules (300 mg total) by mouth 3 (three) times daily. For 7 days 01/30/15   Noland Fordyce, PA-C  predniSONE (DELTASONE) 20 MG tablet 2 tabs po daily x 3 days 06/06/15   Noland Fordyce, PA-C  sodium chloride (OCEAN) 0.65 % SOLN nasal spray Place 1 spray into both nostrils as needed. 06/06/15   Noland Fordyce, PA-C   Meds Ordered and Administered this Visit   Medications  ibuprofen (ADVIL,MOTRIN) tablet 400 mg (400 mg Oral Given 06/06/15 1256)  dexamethasone (DECADRON) injection 10 mg (10 mg Intramuscular Given 06/06/15 1300)    BP 134/88 mmHg  Pulse 91  Temp(Src) 97.3 F (36.3 C) (Oral)  Ht 5\' 6"  (1.676 m)  Wt 230 lb (104.327 kg)  BMI 37.14 kg/m2  SpO2 98%  LMP 05/16/2015 No data found.   Physical Exam  Constitutional: She is oriented to person, place, and time. She appears well-developed and well-nourished. No distress.  HENT:  Head: Normocephalic and atraumatic.  Right Ear: Hearing, tympanic membrane, external ear and ear canal normal.  Left Ear: Hearing, tympanic membrane, external ear and ear canal normal.  Nose: Right sinus exhibits maxillary sinus tenderness. Right sinus exhibits no frontal sinus tenderness. Left sinus exhibits maxillary sinus tenderness and frontal sinus tenderness.  Mouth/Throat: Uvula is midline, oropharynx is clear and moist and mucous membranes are  normal.  Eyes: Conjunctivae are normal. No scleral icterus.  Neck: Normal range of motion. Neck supple.  Cardiovascular: Normal rate, regular rhythm and normal heart sounds.   Pulmonary/Chest: Effort normal and breath sounds normal. No stridor. No respiratory  distress. She has no wheezes. She has no rales. She exhibits no tenderness.  Abdominal: Soft. Bowel sounds are normal. She exhibits no distension and no mass. There is no tenderness. There is no rebound and no guarding.  Musculoskeletal: Normal range of motion.  Lymphadenopathy:    She has no cervical adenopathy.  Neurological: She is alert and oriented to person, place, and time. She has normal strength. No cranial nerve deficit or sensory deficit. Coordination and gait normal. GCS eye subscore is 4. GCS verbal subscore is 5. GCS motor subscore is 6.  Skin: Skin is warm and dry. She is not diaphoretic.  Nursing note and vitals reviewed.   ED Course  Procedures (including critical care time)  Labs Review Labs Reviewed - No data to display  Imaging Review No results found.   Visual Acuity Review  Right Eye Distance: 20/20 Left Eye Distance: 20/20 Bilateral Distance: 20/20 (without correction)  Tympanometry: Left and Right ear- Low peak height, Normal ear volume  MDM   1. Left-sided headache   2. Sinus pressure    Pt c/o Left sided headache and sinus pressure. Hx of similar headaches. No red flag symptoms.  Normal neuro exam  Tx in UC: ibuprofen and decadron 10mg  IM  Pt states she is feeling her headache improve after medication given in UC  Pt feels safe being discharged home and plans to go back to work today, declined work note. Rx: prednisone if HA persists after decadron. Nasal saline.  Encouraged to use Neti pot. Advised pt she can use even when she does not have a sinus infection. Advised pt to use acetaminophen and ibuprofen as needed for fever and pain. Encouraged rest and fluids. F/u with PCP in 2-3 days if not improving, sooner if worsening. Also encouraged to f/u with an ENT if headaches keep becoming more frequent.  Pt verbalized understanding and agreement with tx plan.     Noland Fordyce, PA-C 06/06/15 1307

## 2015-06-06 NOTE — ED Notes (Signed)
Headache started this morning left side pressure behind left eye. 8/10

## 2015-06-08 ENCOUNTER — Telehealth: Payer: Self-pay | Admitting: Emergency Medicine

## 2015-06-14 ENCOUNTER — Other Ambulatory Visit (HOSPITAL_COMMUNITY)
Admission: RE | Admit: 2015-06-14 | Discharge: 2015-06-14 | Disposition: A | Discharge status: Home / Self Care | Payer: BLUE CROSS/BLUE SHIELD | Source: Ambulatory Visit | Attending: Physician Assistant | Admitting: Physician Assistant

## 2015-06-14 ENCOUNTER — Ambulatory Visit (INDEPENDENT_AMBULATORY_CARE_PROVIDER_SITE_OTHER): Payer: BLUE CROSS/BLUE SHIELD | Admitting: Physician Assistant

## 2015-06-14 ENCOUNTER — Encounter: Payer: Self-pay | Admitting: Physician Assistant

## 2015-06-14 VITALS — BP 131/72 | HR 90 | Ht 66.0 in | Wt 239.0 lb

## 2015-06-14 DIAGNOSIS — Z131 Encounter for screening for diabetes mellitus: Secondary | ICD-10-CM

## 2015-06-14 DIAGNOSIS — Z13220 Encounter for screening for lipoid disorders: Secondary | ICD-10-CM | POA: Diagnosis not present

## 2015-06-14 DIAGNOSIS — Z01419 Encounter for gynecological examination (general) (routine) without abnormal findings: Secondary | ICD-10-CM | POA: Diagnosis present

## 2015-06-14 DIAGNOSIS — Z1151 Encounter for screening for human papillomavirus (HPV): Secondary | ICD-10-CM | POA: Insufficient documentation

## 2015-06-14 DIAGNOSIS — Z0000 Encounter for general adult medical examination without abnormal findings: Secondary | ICD-10-CM

## 2015-06-14 DIAGNOSIS — E669 Obesity, unspecified: Secondary | ICD-10-CM

## 2015-06-14 LAB — COMPLETE METABOLIC PANEL WITH GFR
ALT: 10 U/L (ref 6–29)
AST: 14 U/L (ref 10–30)
Albumin: 3.6 g/dL (ref 3.6–5.1)
Alkaline Phosphatase: 67 U/L (ref 33–115)
BILIRUBIN TOTAL: 0.6 mg/dL (ref 0.2–1.2)
BUN: 10 mg/dL (ref 7–25)
CO2: 26 mmol/L (ref 20–31)
Calcium: 8.7 mg/dL (ref 8.6–10.2)
Chloride: 107 mmol/L (ref 98–110)
Creat: 0.65 mg/dL (ref 0.50–1.10)
Glucose, Bld: 90 mg/dL (ref 65–99)
Potassium: 4.1 mmol/L (ref 3.5–5.3)
Sodium: 140 mmol/L (ref 135–146)
Total Protein: 6.1 g/dL (ref 6.1–8.1)

## 2015-06-14 LAB — LIPID PANEL
Cholesterol: 177 mg/dL (ref 125–200)
HDL: 48 mg/dL (ref >=46)
LDL Cholesterol: 103 mg/dL (ref <130)
TRIGLYCERIDES: 131 mg/dL (ref <150)
Total CHOL/HDL Ratio: 3.7 Ratio (ref <=5.0)
VLDL: 26 mg/dL (ref <30)

## 2015-06-14 NOTE — Patient Instructions (Signed)

## 2015-06-14 NOTE — Progress Notes (Addendum)
  Subjective:     Janet Clayton is a 30 y.o. female and is here for a comprehensive physical exam. The patient reports no problems. Patient states that she is trying to exercise more and is watching her diet.Patient states that she is in the process of joining a gym near her house. Patient states that she is nervous when doing pelvic exams.   No abnormal pap smear. LMP just finishing.   Social History   Social History  . Marital Status: Unknown    Spouse Name: N/A  . Number of Children: N/A  . Years of Education: N/A   Occupational History  . Not on file.   Social History Main Topics  . Smoking status: Never Smoker   . Smokeless tobacco: Not on file  . Alcohol Use: 0.0 oz/week    0 Standard drinks or equivalent per week  . Drug Use: No  . Sexual Activity: Not Currently   Other Topics Concern  . Not on file   Social History Narrative   Health Maintenance  Topic Date Due  . PAP SMEAR  04/21/2015  . INFLUENZA VACCINE  06/13/2016 (Originally 11/19/2014)  . TETANUS/TDAP  04/20/2021  . HIV Screening  Completed    The following portions of the patient's history were reviewed and updated as appropriate: allergies, current medications, past family history, past medical history, past social history, past surgical history and problem list.  Review of Systems A comprehensive review of systems was negative.   Objective:    BP 131/72 mmHg  Pulse 90  Ht 5\' 6"  (1.676 m)  Wt 239 lb (108.41 kg)  BMI 38.59 kg/m2  LMP 05/16/2015 General appearance: alert, cooperative and appears stated age Head: Normocephalic, without obvious abnormality, atraumatic, sinuses nontender to percussion Eyes: conjunctivae/corneas clear. PERRL, EOM's intact. Fundi benign. Ears: normal TM's and external ear canals both ears Nose: Nares normal. Septum midline. Mucosa normal. No drainage or sinus tenderness. Throat: lips, mucosa, and tongue normal; teeth and gums normal Neck: no adenopathy, no carotid  bruit, no JVD, supple, symmetrical, trachea midline and thyroid not enlarged, symmetric, no tenderness/mass/nodules Back: symmetric, no curvature. ROM normal. No CVA tenderness. Lungs: clear to auscultation bilaterally Breasts: normal appearance, no masses or tenderness Heart: regular rate and rhythm, S1, S2 normal, no murmur, click, rub or gallop Abdomen: soft, non-tender; bowel sounds normal; no masses,  no organomegaly Pelvic: cervix normal in appearance, external genitalia normal, no adnexal masses or tenderness, no cervical motion tenderness, rectovaginal septum normal, uterus normal size, shape, and consistency and vagina normal without discharge Extremities: extremities normal, atraumatic, no cyanosis or edema Pulses: 2+ and symmetric Skin: Skin color, texture, turgor normal. No rashes or lesions Lymph nodes: Cervical, supraclavicular, and axillary nodes normal. Neurologic: Grossly normal    Assessment:    Healthy female exam.      Plan:     1. Health Maintenance/obesity- Patient's lipid panel, CMP and TSH were ordered. Patient's labs will be monitored. Patient education was provided regarding diet and exercise.PAP done today. Pt declines STD testing.  Patient was advised to make a follow up appointment if she would like to discuss weight loss medications in the future. Encouraged to start vitamin D 800 units and calcium 1500mg  daily.

## 2015-06-15 LAB — TSH: TSH: 1.4 mIU/L

## 2015-06-17 LAB — CYTOLOGY - PAP

## 2015-10-04 ENCOUNTER — Emergency Department (INDEPENDENT_AMBULATORY_CARE_PROVIDER_SITE_OTHER)
Admission: EM | Admit: 2015-10-04 | Discharge: 2015-10-04 | Disposition: A | Discharge status: Home / Self Care | Payer: BLUE CROSS/BLUE SHIELD | Source: Home / Self Care | Attending: Family Medicine | Admitting: Family Medicine

## 2015-10-04 ENCOUNTER — Encounter: Payer: Self-pay | Admitting: Emergency Medicine

## 2015-10-04 DIAGNOSIS — M5442 Lumbago with sciatica, left side: Secondary | ICD-10-CM | POA: Diagnosis not present

## 2015-10-04 MED ORDER — PREDNISONE 20 MG PO TABS: ORAL_TABLET | ORAL | Status: DC

## 2015-10-04 MED ORDER — TRAMADOL HCL 50 MG PO TABS: 50.0000 mg | ORAL_TABLET | Freq: Four times a day (QID) | ORAL | Status: DC | PRN

## 2015-10-04 MED ORDER — CYCLOBENZAPRINE HCL 10 MG PO TABS: 10.0000 mg | ORAL_TABLET | Freq: Two times a day (BID) | ORAL | Status: DC | PRN

## 2015-10-04 NOTE — Discharge Instructions (Signed)
°

## 2015-10-04 NOTE — ED Notes (Signed)
Sciatica left side x 9 days fell in the shower about 2 weeks ago. 8/10

## 2015-10-04 NOTE — ED Provider Notes (Signed)
CSN: [id number]     Arrival date & time 10/04/15  1235 History   None    Chief Complaint  Patient presents with  . Back Pain   (Consider location/radiation/quality/duration/timing/severity/associated sxs/prior Treatment)  HPI  Janet Clayton is a 30 y.o. female presenting to UC with c/o Left lower back pain that radiates down Left leg for the last 9 days. Pain is worse after change in position after sitting or lying down for prolonged time, aching, sharp and shooting, 8/10.  She reports falling in her shower about 2 weeks ago but did not start having pain until a week after. She cannot recall anything else that may have started her back pain.  Hx of sciatica and bulging discs, however, after seeing a chiropractor she was doing well so she had not seen her chiropractor for about 6 months until most recent pain started. She has since seen him 4 times but no relief.  Repots mild weakness and tightness in Left leg. She has been on steroid tapers, tramadol, and muscle relaxers in the past with relief. No hx of back surgeries. Denies change in bowel or bladder habits.    History reviewed. No pertinent past medical history. History reviewed. No pertinent past surgical history. No family history on file. Social History  Substance Use Topics  . Smoking status: Never Smoker   . Smokeless tobacco: None  . Alcohol Use: 0.0 oz/week    0 Standard drinks or equivalent per week   OB History    No data available     Review of Systems  Genitourinary: Negative for dysuria, hematuria and flank pain.  Musculoskeletal: Positive for myalgias and back pain. Negative for joint swelling, arthralgias, gait problem, neck pain and neck stiffness.  Skin: Negative for color change and rash.  Neurological: Positive for numbness (occasional tngling in Left leg ). Negative for weakness.    Allergies  Augmentin; Codeine; and Sulfa antibiotics  Home Medications   Prior to Admission medications   Medication Sig  Start Date End Date Taking? Authorizing Provider  cyclobenzaprine (FLEXERIL) 10 MG tablet Take 1 tablet (10 mg total) by mouth 2 (two) times daily as needed for muscle spasms. 10/04/15   Noland Fordyce, PA-C  predniSONE (DELTASONE) 20 MG tablet 3 tabs po daily x 3 days, then 2 tabs x 3 days, then 1.5 tabs x 3 days, then 1 tab x 3 days, then 0.5 tabs x 3 days 10/04/15   Noland Fordyce, PA-C  sodium chloride (OCEAN) 0.65 % SOLN nasal spray Place 1 spray into both nostrils as needed. 06/06/15   Noland Fordyce, PA-C  traMADol (ULTRAM) 50 MG tablet Take 1 tablet (50 mg total) by mouth every 6 (six) hours as needed. 10/04/15   Noland Fordyce, PA-C   Meds Ordered and Administered this Visit  Medications - No data to display  BP 130/87 mmHg  Pulse 74  Temp(Src) 97.6 F (36.4 C) (Oral)  Ht 5\' 6"  (1.676 m)  Wt 225 lb (102.059 kg)  BMI 36.33 kg/m2  SpO2 95% No data found.   Physical Exam  Constitutional: She is oriented to person, place, and time. She appears well-developed and well-nourished.  HENT:  Head: Normocephalic and atraumatic.  Eyes: EOM are normal.  Neck: Normal range of motion.  Cardiovascular: Normal rate.   Pulmonary/Chest: Effort normal.  Musculoskeletal: Normal range of motion.  Tenderness to Left and Right lower back and Left buttock and thigh.  Unable to fully straighten Left leg while lying down dow to  pain and "tight" sensation.   Neurological: She is alert and oriented to person, place, and time.  Reflex Scores:      Patellar reflexes are 2+ on the right side and 2+ on the left side. Normal sensation in upper and lower extremities bilaterally.   Skin: Skin is warm and dry.  Psychiatric: She has a normal mood and affect. Her behavior is normal.  Nursing note and vitals reviewed.   ED Course  Procedures (including critical care time)  Labs Review Labs Reviewed - No data to display  Imaging Review No results found.   MDM   1. Bilateral low back pain with left-sided  sciatica    Pt c/o exacerbation of low back pain and recurrence of sciatica. Denies red flag symptoms- no change in bowel or bladder habits. Able to ambulate w/o assistance.  Discussed imaging to evaluate alignment of vertebrae, pt declined stating she has felt this pain before and imaging is usually negative.  She would like to try conservative treatment first, then will f/u with Sports Medicine in 1-2 weeks if not improving.  Rx: Tramadol, prednisone, and flexeril  Home care instructions provided. Patient verbalized understanding and agreement with treatment plan.     Noland Fordyce, PA-C 10/04/15 1539

## 2015-11-10 ENCOUNTER — Encounter: Payer: Self-pay | Admitting: Emergency Medicine

## 2015-11-10 ENCOUNTER — Emergency Department
Admission: EM | Admit: 2015-11-10 | Discharge: 2015-11-10 | Disposition: A | Discharge status: Home / Self Care | Payer: BLUE CROSS/BLUE SHIELD | Source: Home / Self Care | Attending: Family Medicine | Admitting: Family Medicine

## 2015-11-10 DIAGNOSIS — N3090 Cystitis, unspecified without hematuria: Secondary | ICD-10-CM | POA: Diagnosis not present

## 2015-11-10 MED ORDER — NITROFURANTOIN MONOHYD MACRO 100 MG PO CAPS: 100.0000 mg | ORAL_CAPSULE | Freq: Two times a day (BID) | ORAL | 0 refills | Status: DC

## 2015-11-10 MED ORDER — PHENAZOPYRIDINE HCL 200 MG PO TABS: 200.0000 mg | ORAL_TABLET | Freq: Three times a day (TID) | ORAL | 0 refills | Status: DC

## 2015-11-10 NOTE — ED Provider Notes (Signed)
[name]    CSN: [id number] Arrival date & time: 11/10/15  1406          History   Chief Complaint Chief Complaint  Patient presents with  . Urinary Tract Infection    HPI Janet Clayton is a 30 y.o. female.   Patient complains of two day history of urinary urgency, frequency, and dysuria.  She has lower abdominal bloating but not pain, and has had mild nocturia for one week.     The history is provided by the patient.  Dysuria  Pain quality:  Aching Pain severity:  Mild Onset quality:  Gradual Duration:  2 days Timing:  Constant Progression:  Worsening Chronicity:  New Recent urinary tract infections: no   Relieved by:  Phenazopyridine Worsened by:  Nothing Ineffective treatments:  None tried Urinary symptoms: frequent urination and hesitancy   Urinary symptoms: no discolored urine, no foul-smelling urine, no hematuria and no bladder incontinence   Associated symptoms: no abdominal pain, no fever, no flank pain, no genital lesions, no nausea, no vaginal discharge and no vomiting   Risk factors: no recurrent urinary tract infections     History reviewed. No pertinent past medical history.  Patient Active Problem List   Diagnosis Date Noted  . Hyperlipidemia 06/22/2014  . Dermatofibroma 06/19/2014  . Epidermal cyst 06/18/2014  . Obesity 06/18/2014  . Routine physical examination 06/18/2014    History reviewed. No pertinent surgical history.  OB History    No data available       Home Medications    Prior to Admission medications   Medication Sig Start Date End Date Taking? Authorizing Provider  nitrofurantoin, macrocrystal-monohydrate, (MACROBID) 100 MG capsule Take 1 capsule (100 mg total) by mouth 2 (two) times daily. Take with food. 11/10/15   Kandra Nicolas, MD  phenazopyridine (PYRIDIUM) 200 MG tablet Take 1 tablet (200 mg total) by mouth 3 (three) times daily. Take after meals. 11/10/15   Kandra Nicolas, MD    Family  History No family history on file.  Social History Social History  Substance Use Topics  . Smoking status: Never Smoker  . Smokeless tobacco: Never Used  . Alcohol use 0.0 oz/week     Allergies   Augmentin [amoxicillin-pot clavulanate]; Codeine; and Sulfa antibiotics   Review of Systems Review of Systems  Constitutional: Negative for fever.  Gastrointestinal: Negative for abdominal pain, nausea and vomiting.  Genitourinary: Positive for dysuria. Negative for flank pain and vaginal discharge.  All other systems reviewed and are negative.    Physical Exam Triage Vital Signs ED Triage Vitals  Enc Vitals Group     BP 11/10/15 1450 126/91     Pulse Rate 11/10/15 1450 91     Resp --      Temp 11/10/15 1450 98.2 F (36.8 C)     Temp Source 11/10/15 1450 Oral     SpO2 11/10/15 1450 98 %     Weight 11/10/15 1450 239 lb 8 oz (108.6 kg)     Height 11/10/15 1450 5\' 6"  (1.676 m)     Head Circumference --      Peak Flow --      Pain Score 11/10/15 1452 7     Pain Loc --      Pain Edu? --      Excl. in [place]? --      Updated Vital Signs BP 126/91 (BP Location: Left Arm)   Pulse 91   Temp 98.2 F (36.8 C) (  Oral)   Ht 5\' 6"  (1.676 m)   Wt 239 lb 8 oz (108.6 kg)   SpO2 98%   BMI 38.66 kg/m   Physical Exam  Nursing notes and Vital Signs reviewed. Appearance:  Patient appears stated age, and in no acute distress.    Eyes:  Pupils are equal, round, and reactive to light and accomodation.  Extraocular movement is intact.  Conjunctivae are not inflamed   Pharynx:  Normal; moist mucous membranes  Neck:  Supple.  No adenopathy Lungs:  Clear to auscultation.  Breath sounds are equal.  Moving air well. Heart:  Regular rate and rhythm without murmurs, rubs, or gallops.  Abdomen:  Nontender without masses or hepatosplenomegaly.  Bowel sounds are present.  No CVA or flank tenderness.  Extremities:  No edema.  Skin:  No rash present.      UC Treatments / Results  Labs (all labs  ordered are listed, but only abnormal results are displayed) Labs Reviewed  URINE CULTURE      Procedures Procedures  none    Initial Impression / Assessment and Plan / UC Course  I have reviewed the triage vital signs and the nursing notes.  Pertinent labs & imaging results that were available during my care of the patient were reviewed by me and considered in my medical decision making (see chart for details).  Clinical Course       Final Clinical Impressions(s) / UC Diagnoses   Final diagnoses:  Cystitis    New Prescriptions Discharge Medication List as of 11/10/2015  3:14 PM    START taking these medications   Details  nitrofurantoin, macrocrystal-monohydrate, (MACROBID) 100 MG capsule Take 1 capsule (100 mg total) by mouth 2 (two) times daily. Take with food., Starting Sun 11/10/2015, Print    phenazopyridine (PYRIDIUM) 200 MG tablet Take 1 tablet (200 mg total) by mouth 3 (three) times daily. Take after meals., Starting Sun 11/10/2015, Print      Urine culture pending Increase fluid intake.  If symptoms become significantly worse during the night or over the weekend, proceed to the local emergency room.  Followup with Family Doctor if not improved in one week.    Kandra Nicolas, MD 11/10/15 [phone]

## 2015-11-10 NOTE — Discharge Instructions (Signed)
Increase fluid intake. °If symptoms become significantly worse during the night or over the weekend, proceed to the local emergency room.  °

## 2015-11-10 NOTE — ED Triage Notes (Signed)
Pt c/o urgency, frequency and dysuria.  No relief w/AZO.

## 2015-11-11 LAB — URINE CULTURE: Organism ID, Bacteria: NO GROWTH

## 2015-11-13 ENCOUNTER — Telehealth: Payer: Self-pay | Admitting: *Deleted

## 2015-11-13 NOTE — Telephone Encounter (Signed)
LM with Ucx results and to call back if she has any questions or concerns. Dayanne Yiu, LPN  

## 2016-02-07 ENCOUNTER — Emergency Department
Admission: EM | Admit: 2016-02-07 | Discharge: 2016-02-07 | Disposition: A | Discharge status: Home / Self Care | Payer: Self-pay | Source: Home / Self Care | Attending: Family Medicine | Admitting: Family Medicine

## 2016-02-07 ENCOUNTER — Encounter: Payer: Self-pay | Admitting: Emergency Medicine

## 2016-02-07 DIAGNOSIS — L03211 Cellulitis of face: Secondary | ICD-10-CM

## 2016-02-07 MED ORDER — DOXYCYCLINE HYCLATE 100 MG PO CAPS: 100.0000 mg | ORAL_CAPSULE | Freq: Two times a day (BID) | ORAL | 0 refills | Status: DC

## 2016-02-07 NOTE — ED Triage Notes (Signed)
Patient noticed "pimple" on left temple about a week ago; it has enlarged and is now painful even in adjacent areas.

## 2016-02-07 NOTE — Discharge Instructions (Signed)
Apply warm compress several times daily.  May take Ibuprofen 200mg , 4 tabs every 8 hours with food.

## 2016-02-07 NOTE — ED Provider Notes (Signed)
[name]    CSN: [id number] Arrival date & time: 02/07/16  1853     History   Chief Complaint Chief Complaint  Patient presents with  . Mass    left temple    HPI Janet Clayton is a 30 y.o. female.   Patient noticed a small "pimple" on her left temple about one week ago.  The area has enlarged and become more tender to palpation.   The history is provided by the patient.  Rash  Location: left temple. Quality: painful, redness, scaling and swelling   Quality: not blistering, not draining, not itchy and not weeping   Pain details:    Quality:  Aching   Severity:  Mild   Onset quality:  Gradual   Duration:  1 week   Timing:  Constant   Progression:  Worsening Severity:  Mild Onset quality:  Gradual Duration:  1 week Timing:  Constant Progression:  Worsening Chronicity:  New Context: not exposure to similar rash, not insect bite/sting and not plant contact   Relieved by:  Nothing Worsened by:  Contact Ineffective treatments:  None tried Associated symptoms: induration   Associated symptoms: no abdominal pain, no fatigue, no fever, no headaches, no myalgias and no periorbital edema     History reviewed. No pertinent past medical history.  Patient Active Problem List   Diagnosis Date Noted  . Hyperlipidemia 06/22/2014  . Dermatofibroma 06/19/2014  . Epidermal cyst 06/18/2014  . Obesity 06/18/2014  . Routine physical examination 06/18/2014    History reviewed. No pertinent surgical history.  OB History    No data available       Home Medications    Prior to Admission medications   Medication Sig Start Date End Date Taking? Authorizing Provider  doxycycline (VIBRAMYCIN) 100 MG capsule Take 1 capsule (100 mg total) by mouth 2 (two) times daily. Take with food. 02/07/16   Kandra Nicolas, MD    Family History History reviewed. No pertinent family history.  Social History Social History  Substance Use Topics  . Smoking status:  Never Smoker  . Smokeless tobacco: Never Used  . Alcohol use 0.0 oz/week     Allergies   Augmentin [amoxicillin-pot clavulanate]; Codeine; and Sulfa antibiotics   Review of Systems Review of Systems  Constitutional: Negative for fatigue and fever.  Gastrointestinal: Negative for abdominal pain.  Musculoskeletal: Negative for myalgias.  Skin: Positive for rash.  Neurological: Negative for headaches.  All other systems reviewed and are negative.    Physical Exam Triage Vital Signs ED Triage Vitals  Enc Vitals Group     BP 02/07/16 1914 121/85     Pulse Rate 02/07/16 1914 81     Resp 02/07/16 1914 16     Temp 02/07/16 1914 98.2 F (36.8 C)     Temp Source 02/07/16 1914 Oral     SpO2 02/07/16 1914 100 %     Weight 02/07/16 1915 227 lb (103 kg)     Height 02/07/16 1915 5\' 6"  (1.676 m)     Head Circumference --      Peak Flow --      Pain Score 02/07/16 1917 3     Pain Loc --      Pain Edu? --      Excl. in [place]? --    No data found.   Updated Vital Signs BP 121/85 (BP Location: Left Arm)   Pulse 81   Temp 98.2 F (36.8 C) (Oral)   Resp  16   Ht 5\' 6"  (1.676 m)   Wt 227 lb (103 kg)   LMP 01/17/2016 (Exact Date)   SpO2 100%   BMI 36.64 kg/m   Visual Acuity Right Eye Distance:   Left Eye Distance:   Bilateral Distance:    Right Eye Near:   Left Eye Near:    Bilateral Near:     Physical Exam  Constitutional: She appears well-developed and well-nourished. No distress.  HENT:  Head: Normocephalic.    Right Ear: External ear normal.  Left Ear: External ear normal.  Nose: Nose normal.  Mouth/Throat: Oropharynx is clear and moist.  Left temple has indurated erythematous area 1cm by 1.5cm with central eschar.  Lesion is not fluctuant.  Eyes: Conjunctivae and EOM are normal. Pupils are equal, round, and reactive to light.  Neck: Neck supple.  Cardiovascular: Normal heart sounds.   Pulmonary/Chest: Breath sounds normal.  Lymphadenopathy:    She has no  cervical adenopathy.  Neurological: She is alert.  Skin: Skin is warm and dry.  Nursing note and vitals reviewed.    UC Treatments / Results  Labs (all labs ordered are listed, but only abnormal results are displayed) Labs Reviewed - No data to display  EKG  EKG Interpretation None       Radiology No results found.  Procedures Procedures (including critical care time)  Medications Ordered in UC Medications - No data to display   Initial Impression / Assessment and Plan / UC Course  I have reviewed the triage vital signs and the nursing notes.  Pertinent labs & imaging results that were available during my care of the patient were reviewed by me and considered in my medical decision making (see chart for details).  Clinical Course  Begin doxycycline 100mg  BID for staph coverage. Apply warm compress several times daily.  May take Ibuprofen 200mg , 4 tabs every 8 hours with food.  Return if not improving 2 to 3 days (may need I and D).     Final Clinical Impressions(s) / UC Diagnoses   Final diagnoses:  Cellulitis of temple    New Prescriptions New Prescriptions   DOXYCYCLINE (VIBRAMYCIN) 100 MG CAPSULE    Take 1 capsule (100 mg total) by mouth 2 (two) times daily. Take with food.     Kandra Nicolas, MD 02/13/16 [phone]

## 2016-04-28 ENCOUNTER — Encounter: Payer: Self-pay | Admitting: Physician Assistant

## 2016-04-28 ENCOUNTER — Ambulatory Visit (INDEPENDENT_AMBULATORY_CARE_PROVIDER_SITE_OTHER): Payer: 59 | Admitting: Physician Assistant

## 2016-04-28 VITALS — BP 135/90 | HR 103 | Ht 66.0 in | Wt 246.0 lb

## 2016-04-28 DIAGNOSIS — Z3009 Encounter for other general counseling and advice on contraception: Secondary | ICD-10-CM | POA: Diagnosis not present

## 2016-04-28 MED ORDER — NORETHIN-ETH ESTRAD-FE BIPHAS 1 MG-10 MCG / 10 MCG PO TABS: 1.0000 | ORAL_TABLET | Freq: Every day | ORAL | 11 refills | Status: DC

## 2016-04-28 NOTE — Patient Instructions (Signed)
Oral Contraception Information Oral contraceptive pills (OCPs) are medicines taken to prevent pregnancy. OCPs work by preventing the ovaries from releasing eggs. The hormones in OCPs also cause the cervical mucus to thicken, preventing the sperm from entering the uterus. The hormones also cause the uterine lining to become thin, not allowing a fertilized egg to attach to the inside of the uterus. OCPs are highly effective when taken exactly as prescribed. However, OCPs do not prevent sexually transmitted diseases (STDs). Safe sex practices, such as using condoms along with the pill, can help prevent STDs.  Before taking the pill, you may have a physical exam and Pap test. Your health care provider may order blood tests. The health care provider will make sure you are a good candidate for oral contraception. Discuss with your health care provider the possible side effects of the OCP you may be prescribed. When starting an OCP, it can take 2 to 3 months for the body to adjust to the changes in hormone levels in your body.  TYPES OF ORAL CONTRACEPTION  The combination pill-This pill contains estrogen and progestin (synthetic progesterone) hormones. The combination pill comes in 21-day, 28-day, or 91-day packs. Some types of combination pills are meant to be taken continuously (365-day pills). With 21-day packs, you do not take pills for 7 days after the last pill. With 28-day packs, the pill is taken every day. The last 7 pills are without hormones. Certain types of pills have more than 21 hormone-containing pills. With 91-day packs, the first 84 pills contain both hormones, and the last 7 pills contain no hormones or contain estrogen only.  The minipill-This pill contains the progesterone hormone only. The pill is taken every day continuously. It is very important to take the pill at the same time each day. The minipill comes in packs of 28 pills. All 28 pills contain the hormone.  ADVANTAGES OF ORAL  CONTRACEPTIVE PILLS  Decreases premenstrual symptoms.   Treats menstrual period cramps.   Regulates the menstrual cycle.   Decreases a heavy menstrual flow.   May treatacne, depending on the type of pill.   Treats abnormal uterine bleeding.   Treats polycystic ovarian syndrome.   Treats endometriosis.   Can be used as emergency contraception.  THINGS THAT CAN MAKE ORAL CONTRACEPTIVE PILLS LESS EFFECTIVE OCPs can be less effective if:   You forget to take the pill at the same time every day.   You have a stomach or intestinal disease that lessens the absorption of the pill.   You take OCPs with other medicines that make OCPs less effective, such as antibiotics, certain HIV medicines, and some seizure medicines.   You take expired OCPs.   You forget to restart the pill on day 7, when using the packs of 21 pills.  RISKS ASSOCIATED WITH ORAL CONTRACEPTIVE PILLS  Oral contraceptive pills can sometimes cause side effects, such as:  Headache.  Nausea.  Breast tenderness.  Irregular bleeding or spotting. Combination pills are also associated with a small increased risk of:  Blood clots.  Heart attack.  Stroke. This information is not intended to replace advice given to you by your health care provider. Make sure you discuss any questions you have with your health care provider. Document Released: 06/27/2002 Document Revised: 07/29/2015 Document Reviewed: 09/25/2012 Elsevier Interactive Patient Education  2017 [employer].

## 2016-04-28 NOTE — Progress Notes (Signed)
   Subjective:    Patient ID: Janet Clayton, female    DOB: 1986/02/26, 31 y.o.   MRN: [id number]  HPI  Pt is a 31 yo female who presents to the clinic to discuss birth control options. LMP was 04/09/16. She has tried yasmin in the past and made her irritable. She is not currently sexually active but is dating someone and intends to be in the next few months. Pap smear is 06/14/15 and normal.    Review of Systems  All other systems reviewed and are negative.      Objective:   Physical Exam  Constitutional: She is oriented to person, place, and time. She appears well-developed and well-nourished.  Obese.   HENT:  Head: Normocephalic and atraumatic.  Cardiovascular: Normal rate, regular rhythm and normal heart sounds.   Pulmonary/Chest: Effort normal and breath sounds normal.  Neurological: She is alert and oriented to person, place, and time.  Psychiatric: She has a normal mood and affect. Her behavior is normal.          Assessment & Plan:  Marland KitchenMarland KitchenDiagnoses and all orders for this visit:  Birth control counseling -     Norethindrone-Ethinyl Estradiol-Fe Biphas (LO LOESTRIN FE) 1 MG-10 MCG / 10 MCG tablet; Take 1 tablet by mouth daily.   Discussed options with nuva ring, depo shot, nexaplanon, IUD and OCP. We decided to try OCP at very low dose. Discussed other side effects.  Concerned about weight gain. Discuss 1500 calorie diet and 150 minutes of exercise a week. Discussed weight loss medication and to follow up if would like to consider.  Pt aware does not protected against STD and to use condoms.  HO given.

## 2016-04-29 ENCOUNTER — Encounter: Payer: Self-pay | Admitting: Physician Assistant

## 2016-12-08 ENCOUNTER — Emergency Department
Admission: EM | Admit: 2016-12-08 | Discharge: 2016-12-08 | Disposition: A | Discharge status: Home / Self Care | Payer: 59 | Source: Home / Self Care | Attending: Family Medicine | Admitting: Family Medicine

## 2016-12-08 DIAGNOSIS — L739 Follicular disorder, unspecified: Secondary | ICD-10-CM | POA: Diagnosis not present

## 2016-12-08 MED ORDER — MUPIROCIN 2 % EX OINT: TOPICAL_OINTMENT | CUTANEOUS | 0 refills | Status: DC

## 2016-12-08 MED ORDER — CEPHALEXIN 500 MG PO CAPS: 500.0000 mg | ORAL_CAPSULE | Freq: Two times a day (BID) | ORAL | 0 refills | Status: DC

## 2016-12-08 NOTE — ED Triage Notes (Signed)
Pt stated that on Saturday what she thinks is an ingrown hair started on her chin.  It has now become warm to the touch, and throbbing.  Denies drainage.

## 2016-12-08 NOTE — ED Provider Notes (Signed)
[name]    CSN: [id number] Arrival date & time: 12/08/16  1732     History   Chief Complaint Chief Complaint  Patient presents with  . Abscess    under chin    HPI Janet Clayton is a 31 y.o. female.   HPI  Janet Clayton is a 31 y.o. female presenting to UC with c/o 3-4 days of gradually worsening pain, redness, and mild swelling under her chin.  She thinks she has an ingrown hair that caused symptoms to start.  She does not believe there is much to drain but states it is warm to the touch and throbbing, 6/10.  Denies fever, chills, n/v/d. Hx of folliculitis near scalp in the past. Symptoms feel similar.    History reviewed. No pertinent past medical history.  Patient Active Problem List   Diagnosis Date Noted  . Hyperlipidemia 06/22/2014  . Dermatofibroma 06/19/2014  . Epidermal cyst 06/18/2014  . Obesity 06/18/2014  . Routine physical examination 06/18/2014    History reviewed. No pertinent surgical history.  OB History    No data available       Home Medications    Prior to Admission medications   Medication Sig Start Date End Date Taking? Authorizing Provider  cephALEXin (KEFLEX) 500 MG capsule Take 1 capsule (500 mg total) by mouth 2 (two) times daily. 12/08/16   Noe Gens, PA-C  mupirocin ointment (BACTROBAN) 2 % Apply to sore twice daily for 5 days 12/08/16   Noe Gens, PA-C  Norethindrone-Ethinyl Estradiol-Fe Biphas (LO LOESTRIN FE) 1 MG-10 MCG / 10 MCG tablet Take 1 tablet by mouth daily. 04/28/16   Donella Stade, PA-C    Family History History reviewed. No pertinent family history.  Social History Social History  Substance Use Topics  . Smoking status: Never Smoker  . Smokeless tobacco: Never Used  . Alcohol use 0.0 oz/week     Allergies   Augmentin [amoxicillin-pot clavulanate]; Codeine; and Sulfa antibiotics   Review of Systems Review of Systems  Constitutional: Negative for chills and fever.  HENT: Positive  for facial swelling (under chin).   Musculoskeletal: Negative for arthralgias and myalgias.  Skin: Positive for color change and wound. Negative for rash.     Physical Exam Triage Vital Signs ED Triage Vitals  Enc Vitals Group     BP 12/08/16 1814 123/84     Pulse Rate 12/08/16 1814 77     Resp --      Temp 12/08/16 1814 98.2 F (36.8 C)     Temp Source 12/08/16 1814 Oral     SpO2 12/08/16 1814 97 %     Weight 12/08/16 1815 228 lb (103.4 kg)     Height 12/08/16 1815 5\' 6"  (1.676 m)     Head Circumference --      Peak Flow --      Pain Score 12/08/16 1817 6     Pain Loc --      Pain Edu? --      Excl. in [place]? --    No data found.   Updated Vital Signs BP 123/84 (BP Location: Left Arm)   Pulse 77   Temp 98.2 F (36.8 C) (Oral)   Ht 5\' 6"  (1.676 m)   Wt 228 lb (103.4 kg)   SpO2 97%   BMI 36.80 kg/m   Visual Acuity Right Eye Distance:   Left Eye Distance:   Bilateral Distance:    Right Eye Near:   Left  Eye Near:    Bilateral Near:     Physical Exam  Constitutional: She is oriented to person, place, and time. She appears well-developed and well-nourished. No distress.  HENT:  Head: Normocephalic and atraumatic.    1.5cm area of erythema with centralized pustule. Tender. No bleeding or drainage. No fluctuance.  Eyes: EOM are normal.  Neck: Normal range of motion.  Cardiovascular: Normal rate.   Pulmonary/Chest: Effort normal.  Musculoskeletal: Normal range of motion.  Neurological: She is alert and oriented to person, place, and time.  Skin: Skin is warm and dry. She is not diaphoretic.  Psychiatric: She has a normal mood and affect. Her behavior is normal.  Nursing note and vitals reviewed.    UC Treatments / Results  Labs (all labs ordered are listed, but only abnormal results are displayed) Labs Reviewed - No data to display  EKG  EKG Interpretation None       Radiology No results found.  Procedures Procedures (including critical care  time)  Medications Ordered in UC Medications - No data to display   Initial Impression / Assessment and Plan / UC Course  I have reviewed the triage vital signs and the nursing notes.  Pertinent labs & imaging results that were available during my care of the patient were reviewed by me and considered in my medical decision making (see chart for details).     Hx and exam c/w folliculitis. No indication for I&D at this time.  Final Clinical Impressions(s) / UC Diagnoses   Final diagnoses:  Folliculitis   Home care instructions provided. F/u in 5-7 days if not improving, sooner if worsening.   New Prescriptions Discharge Medication List as of 12/08/2016  6:24 PM    START taking these medications   Details  cephALEXin (KEFLEX) 500 MG capsule Take 1 capsule (500 mg total) by mouth 2 (two) times daily., Starting Tue 12/08/2016, Normal    mupirocin ointment (BACTROBAN) 2 % Apply to sore twice daily for 5 days, Normal         Controlled Substance Prescriptions [place] Controlled Substance Registry consulted? Not Applicable   Tyrell Antonio 12/08/16 1932

## 2016-12-08 NOTE — Discharge Instructions (Signed)
° °  You may take 500mg acetaminophen every 4-6 hours or in combination with ibuprofen 400-600mg every 6-8 hours as needed for pain and inflammation.  

## 2016-12-10 ENCOUNTER — Telehealth: Payer: Self-pay | Admitting: *Deleted

## 2016-12-10 NOTE — Telephone Encounter (Signed)
Callback: No answer, LMOM f/u from visit. Call back as needed.

## 2016-12-14 ENCOUNTER — Ambulatory Visit (INDEPENDENT_AMBULATORY_CARE_PROVIDER_SITE_OTHER): Payer: 59 | Admitting: Physician Assistant

## 2016-12-14 ENCOUNTER — Encounter: Payer: Self-pay | Admitting: Physician Assistant

## 2016-12-14 VITALS — BP 124/68 | HR 92 | Ht 66.0 in | Wt 230.7 lb

## 2016-12-14 DIAGNOSIS — G479 Sleep disorder, unspecified: Secondary | ICD-10-CM

## 2016-12-14 DIAGNOSIS — F4323 Adjustment disorder with mixed anxiety and depressed mood: Secondary | ICD-10-CM

## 2016-12-14 DIAGNOSIS — Z820 Family history of epilepsy and other diseases of the nervous system: Secondary | ICD-10-CM

## 2016-12-14 MED ORDER — CLONAZEPAM 0.5 MG PO TABS: 0.5000 mg | ORAL_TABLET | Freq: Two times a day (BID) | ORAL | 0 refills | Status: DC | PRN

## 2016-12-14 MED ORDER — TRAZODONE HCL 50 MG PO TABS: 25.0000 mg | ORAL_TABLET | Freq: Every evening | ORAL | 1 refills | Status: DC | PRN

## 2016-12-14 MED ORDER — SERTRALINE HCL 50 MG PO TABS: 50.0000 mg | ORAL_TABLET | Freq: Every day | ORAL | 1 refills | Status: DC

## 2016-12-14 NOTE — Progress Notes (Signed)
Subjective:    Patient ID: Janet Clayton, female    DOB: 1985-12-17, 31 y.o.   MRN: [id number]  HPI  Pt is a 31 yo female who presents to the clinic to discuss anxiety and depression. Her mother was recently dx with alzheimers and it has been really hard for patient. She has been going to family counseling after dx. Her ongoing anxiety and depression has increased. She has always had it underlying but never taken anything for it. She is not sleeping due to worry. She has tried zzquil with some help. She was on OCP but made her emotions worse she thought. Her boyfriend broke up with her over anxiety and depression. She feels like her worry "is ruining her life". She denies any feelings of suicide.   .. Active Ambulatory Problems    Diagnosis Date Noted  . Epidermal cyst 06/18/2014  . Obesity 06/18/2014  . Routine physical examination 06/18/2014  . Dermatofibroma 06/19/2014  . Hyperlipidemia 06/22/2014  . Adjustment reaction with anxiety and depression 12/15/2016  . Sleep disturbances 12/15/2016  . Family history of Alzheimer's disease 12/15/2016   Resolved Ambulatory Problems    Diagnosis Date Noted  . No Resolved Ambulatory Problems   No Additional Past Medical History      Review of Systems    see HPI.  Objective:   Physical Exam  Constitutional: She is oriented to person, place, and time. She appears well-developed and well-nourished.  HENT:  Head: Normocephalic and atraumatic.  Cardiovascular: Normal rate, regular rhythm and normal heart sounds.   Neurological: She is alert and oriented to person, place, and time.  Psychiatric:  Tearful.          Assessment & Plan:  Marland KitchenMarland KitchenFronie was seen today for depression.  Diagnoses and all orders for this visit:  Adjustment reaction with anxiety and depression -     sertraline (ZOLOFT) 50 MG tablet; Take 1 tablet (50 mg total) by mouth daily. -     clonazePAM (KLONOPIN) 0.5 MG tablet; Take 1 tablet (0.5 mg total) by mouth  2 (two) times daily as needed for anxiety. -     Ambulatory referral to Psychology  Sleep disturbances -     traZODone (DESYREL) 50 MG tablet; Take 0.5-1 tablets (25-50 mg total) by mouth at bedtime as needed for sleep. -     Ambulatory referral to Psychology  Family history of Alzheimer's disease      .Marland Kitchen Depression screen PHQ 2/9 12/14/2016  Decreased Interest 3  Down, Depressed, Hopeless 3  PHQ - 2 Score 6  Altered sleeping 2  Tired, decreased energy 3  Change in appetite 2  Feeling bad or failure about yourself  3  Trouble concentrating 3  Moving slowly or fidgety/restless 2  Suicidal thoughts 0  PHQ-9 Score 21  .Marland Kitchen GAD 7 : Generalized Anxiety Score 12/15/2016  Nervous, Anxious, on Edge 3  Control/stop worrying 3  Worry too much - different things 3  Trouble relaxing 3  Restless 0  Easily annoyed or irritable 2  Afraid - awful might happen 3  Total GAD 7 Score 17  Anxiety Difficulty Very difficult     Discussed medication options and patient would like to start something today.  Given zoloft. Side effects discussed. Call with any worsening changes in mood. Follow up in 4 to 6 weeks.  Trazodone given for sleep. Start with 1/2 tablet.  Clonazepam given for anxiety. Abuse potential discussed and to only use as needed while zoloft getting into  system.   Referred for counseling to help cope with ongoing anxiety and depression. I think counseling given to cope with dx of her mother with alzheimers disease has been helpful but opened up her areas of anxiety and depression that have been there all along.   Spent 30 minutes and greater than 50 percent of visit spent counseling patient about treatment plan and medications.

## 2016-12-14 NOTE — Patient Instructions (Signed)

## 2016-12-15 ENCOUNTER — Encounter: Payer: Self-pay | Admitting: Physician Assistant

## 2016-12-15 DIAGNOSIS — F4323 Adjustment disorder with mixed anxiety and depressed mood: Secondary | ICD-10-CM | POA: Insufficient documentation

## 2016-12-15 DIAGNOSIS — G479 Sleep disorder, unspecified: Secondary | ICD-10-CM | POA: Insufficient documentation

## 2016-12-15 DIAGNOSIS — Z820 Family history of epilepsy and other diseases of the nervous system: Secondary | ICD-10-CM | POA: Insufficient documentation

## 2017-01-25 ENCOUNTER — Encounter: Payer: Self-pay | Admitting: Physician Assistant

## 2017-01-25 ENCOUNTER — Ambulatory Visit (INDEPENDENT_AMBULATORY_CARE_PROVIDER_SITE_OTHER): Payer: 59 | Admitting: Physician Assistant

## 2017-01-25 ENCOUNTER — Ambulatory Visit (INDEPENDENT_AMBULATORY_CARE_PROVIDER_SITE_OTHER): Payer: 59 | Admitting: Licensed Clinical Social Worker

## 2017-01-25 VITALS — BP 125/75 | HR 78 | Ht 66.0 in | Wt 232.0 lb

## 2017-01-25 DIAGNOSIS — Z569 Unspecified problems related to employment: Secondary | ICD-10-CM

## 2017-01-25 DIAGNOSIS — F411 Generalized anxiety disorder: Secondary | ICD-10-CM | POA: Diagnosis not present

## 2017-01-25 DIAGNOSIS — F4323 Adjustment disorder with mixed anxiety and depressed mood: Secondary | ICD-10-CM

## 2017-01-25 MED ORDER — SERTRALINE HCL 100 MG PO TABS: 100.0000 mg | ORAL_TABLET | Freq: Every day | ORAL | 1 refills | Status: DC

## 2017-01-25 NOTE — Addendum Note (Signed)
Addended by: Donella Stade on: 01/25/2017 11:35 AM   Modules accepted: Level of Service

## 2017-01-25 NOTE — Progress Notes (Signed)
   Subjective:    Patient ID: Janet Clayton, female    DOB: 09-11-1985, 31 y.o.   MRN: [id number]  HPI  Pt is a 31 yo pleasant female who presents to the clinic to follow up on zoloft for anxiety and depression. First 2 weeks she did feel more sweaty and fidgety and some upper back pain but then she felt like it was really helping her anxiety/depression. For sleep she tried trazodone but 1/2 tablet didn't help and 1 full tablet made her feel too groggy the next day. She tried melatonin and working well. She used klonapin once during a heated argument. It did help. She has her first counseling session today. She is thinking of making a job change that could really help with her stress.   .. Active Ambulatory Problems    Diagnosis Date Noted  . Epidermal cyst 06/18/2014  . Obesity 06/18/2014  . Routine physical examination 06/18/2014  . Dermatofibroma 06/19/2014  . Hyperlipidemia 06/22/2014  . Adjustment reaction with anxiety and depression 12/15/2016  . Sleep disturbances 12/15/2016  . Family history of Alzheimer's disease 12/15/2016  . GAD (generalized anxiety disorder) 01/25/2017   Resolved Ambulatory Problems    Diagnosis Date Noted  . No Resolved Ambulatory Problems   No Additional Past Medical History      Review of Systems  All other systems reviewed and are negative.      Objective:   Physical Exam  Constitutional: She is oriented to person, place, and time. She appears well-developed and well-nourished.  HENT:  Head: Normocephalic and atraumatic.  Cardiovascular: Normal rate, regular rhythm and normal heart sounds.   Pulmonary/Chest: Effort normal and breath sounds normal.  Neurological: She is alert and oriented to person, place, and time.  Psychiatric: She has a normal mood and affect. Her behavior is normal.          Assessment & Plan:  Marland KitchenMarland KitchenMargy was seen today for depression and anxiety.  Diagnoses and all orders for this visit:  GAD (generalized  anxiety disorder) -     sertraline (ZOLOFT) 100 MG tablet; Take 1 tablet (100 mg total) by mouth daily.  Adjustment reaction with anxiety and depression -     sertraline (ZOLOFT) 100 MG tablet; Take 1 tablet (100 mg total) by mouth daily.   .. Depression screen [facility] 2/9 01/25/2017 12/14/2016  Decreased Interest 1 3  Down, Depressed, Hopeless 1 3  PHQ - 2 Score 2 6  Altered sleeping 2 2  Tired, decreased energy 3 3  Change in appetite 1 2  Feeling bad or failure about yourself  2 3  Trouble concentrating 2 3  Moving slowly or fidgety/restless 1 2  Suicidal thoughts 0 0  PHQ-9 Score 13 21   .Marland Kitchen GAD 7 : Generalized Anxiety Score 01/25/2017 12/15/2016  Nervous, Anxious, on Edge 1 3  Control/stop worrying 2 3  Worry too much - different things 2 3  Trouble relaxing 1 3  Restless 3 0  Easily annoyed or irritable 0 2  Afraid - awful might happen 1 3  Total GAD 7 Score 10 17  Anxiety Difficulty - Very difficult   Numbers have improved a lot. Encouraged to increase to 100mg  daily. Start with counseling. Follow up in 2 months. If side effects worsen we could consider medication change. Encouraged exercise.
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
## 2017-02-08 ENCOUNTER — Other Ambulatory Visit: Payer: Self-pay | Admitting: Physician Assistant

## 2017-02-08 DIAGNOSIS — F4323 Adjustment disorder with mixed anxiety and depressed mood: Secondary | ICD-10-CM

## 2017-02-08 DIAGNOSIS — G479 Sleep disorder, unspecified: Secondary | ICD-10-CM

## 2017-02-11 ENCOUNTER — Ambulatory Visit (HOSPITAL_COMMUNITY): Payer: Self-pay | Admitting: Licensed Clinical Social Worker

## 2017-03-23 ENCOUNTER — Other Ambulatory Visit: Payer: Self-pay | Admitting: Physician Assistant

## 2017-03-23 DIAGNOSIS — F4323 Adjustment disorder with mixed anxiety and depressed mood: Secondary | ICD-10-CM

## 2017-03-23 DIAGNOSIS — F411 Generalized anxiety disorder: Secondary | ICD-10-CM

## 2017-04-15 ENCOUNTER — Other Ambulatory Visit: Payer: Self-pay | Admitting: Physician Assistant

## 2017-04-15 DIAGNOSIS — F4323 Adjustment disorder with mixed anxiety and depressed mood: Secondary | ICD-10-CM

## 2017-04-23 ENCOUNTER — Other Ambulatory Visit: Payer: Self-pay | Admitting: Physician Assistant

## 2017-04-23 DIAGNOSIS — F411 Generalized anxiety disorder: Secondary | ICD-10-CM

## 2017-04-23 DIAGNOSIS — F4323 Adjustment disorder with mixed anxiety and depressed mood: Secondary | ICD-10-CM

## 2017-04-27 ENCOUNTER — Other Ambulatory Visit: Payer: Self-pay | Admitting: Physician Assistant

## 2017-04-27 DIAGNOSIS — F4323 Adjustment disorder with mixed anxiety and depressed mood: Secondary | ICD-10-CM

## 2017-04-27 DIAGNOSIS — F411 Generalized anxiety disorder: Secondary | ICD-10-CM

## 2017-05-05 ENCOUNTER — Encounter: Payer: Self-pay | Admitting: Physician Assistant

## 2017-05-05 ENCOUNTER — Ambulatory Visit: Payer: 59 | Admitting: Physician Assistant

## 2017-05-05 VITALS — BP 159/87 | HR 99 | Ht 66.0 in | Wt 233.0 lb

## 2017-05-05 DIAGNOSIS — F411 Generalized anxiety disorder: Secondary | ICD-10-CM

## 2017-05-05 DIAGNOSIS — Z79899 Other long term (current) drug therapy: Secondary | ICD-10-CM | POA: Diagnosis not present

## 2017-05-05 DIAGNOSIS — L03211 Cellulitis of face: Secondary | ICD-10-CM | POA: Diagnosis not present

## 2017-05-05 DIAGNOSIS — F4323 Adjustment disorder with mixed anxiety and depressed mood: Secondary | ICD-10-CM

## 2017-05-05 LAB — BASIC METABOLIC PANEL
BUN: 9 mg/dL (ref 7–25)
CO2: 28 mmol/L (ref 20–32)
CREATININE: 0.76 mg/dL (ref 0.50–1.10)
Calcium: 9.5 mg/dL (ref 8.6–10.2)
Chloride: 104 mmol/L (ref 98–110)
Glucose, Bld: 126 mg/dL — ABNORMAL HIGH (ref 65–99)
Potassium: 4.1 mmol/L (ref 3.5–5.3)
Sodium: 141 mmol/L (ref 135–146)

## 2017-05-05 MED ORDER — CLONAZEPAM 0.5 MG PO TABS: 0.5000 mg | ORAL_TABLET | Freq: Two times a day (BID) | ORAL | 1 refills | Status: DC | PRN

## 2017-05-05 MED ORDER — SERTRALINE HCL 100 MG PO TABS: 100.0000 mg | ORAL_TABLET | Freq: Every day | ORAL | 4 refills | Status: DC

## 2017-05-05 MED ORDER — CEPHALEXIN 500 MG PO CAPS: 500.0000 mg | ORAL_CAPSULE | Freq: Two times a day (BID) | ORAL | 0 refills | Status: DC

## 2017-05-05 NOTE — Progress Notes (Signed)
Subjective:    Patient ID: Janet Clayton, female    DOB: 18-Jan-1986, 32 y.o.   MRN: [id number]  HPI Patient is a 32 year old female who presents to the clinic for 56-month follow-up on anxiety and depression.  She is doing much better on Zoloft 100 mg daily.  She is able to sleep with the addition of melatonin.  She started medication after the summer 2018 diagnosis of her mother with Alzheimer's.  She has been going to biweekly counseling to help her cope with this diagnosis and her ongoing anxiety and depression.  Her work is still very stressful but she is in the process of interviewing for a different position where she feels like she will be happier.  She feels like overall she is able to handle the stress and anxiety that is now present in her life.  She denies any suicidal thoughts or homicidal ideations.  She is taking the Klonopin on average once or twice a week.  She does mention a painful red scab on her nose.  Over the weekend she thought she had a pimple.  She tried to mash it.  After mashing it it became red and swollen.  She put some Differin gel on it Sunday night.  It seemed to help dry it up but she still has the redness, swelling, tenderness.  She denies any fever, chills.   .. Active Ambulatory Problems    Diagnosis Date Noted  . Epidermal cyst 06/18/2014  . Obesity 06/18/2014  . Routine physical examination 06/18/2014  . Dermatofibroma 06/19/2014  . Hyperlipidemia 06/22/2014  . Adjustment reaction with anxiety and depression 12/15/2016  . Sleep disturbances 12/15/2016  . Family history of Alzheimer's disease 12/15/2016  . GAD (generalized anxiety disorder) 01/25/2017   Resolved Ambulatory Problems    Diagnosis Date Noted  . No Resolved Ambulatory Problems   No Additional Past Medical History      Review of Systems  All other systems reviewed and are negative.      Objective:   Physical Exam  Constitutional: She is oriented to person, place, and time. She  appears well-developed and well-nourished.  HENT:  Head: Normocephalic and atraumatic.  Scabbed over ulcerative like lesion approximately 3mm on right external nare in size with surround area of redness tenderness and swelling that extends over bilateral nares and midway through nasal bridge.   Cardiovascular: Normal rate, regular rhythm and normal heart sounds.  Pulmonary/Chest: Effort normal and breath sounds normal.  Neurological: She is alert and oriented to person, place, and time.  Psychiatric: She has a normal mood and affect. Her behavior is normal.          Assessment & Plan:  Marland KitchenMarland KitchenLeighanna was seen today for anxiety and depression.  Diagnoses and all orders for this visit:  Adjustment reaction with anxiety and depression -     sertraline (ZOLOFT) 100 MG tablet; Take 1 tablet (100 mg total) by mouth daily. -     clonazePAM (KLONOPIN) 0.5 MG tablet; Take 1 tablet (0.5 mg total) by mouth 2 (two) times daily as needed. -     Basic metabolic panel  GAD (generalized anxiety disorder) -     sertraline (ZOLOFT) 100 MG tablet; Take 1 tablet (100 mg total) by mouth daily. -     Basic metabolic panel  Medication management -     Basic metabolic panel  Cellulitis of face -     cephALEXin (KEFLEX) 500 MG capsule; Take 1 capsule (500 mg total) by mouth  2 (two) times daily. For 7 days.   .. Depression screen [facility] 2/9 05/05/2017 01/25/2017 12/14/2016  Decreased Interest 1 1 3   Down, Depressed, Hopeless 0 1 3  PHQ - 2 Score 1 2 6   Altered sleeping 2 2 2   Tired, decreased energy 1 3 3   Change in appetite 0 1 2  Feeling bad or failure about yourself  0 2 3  Trouble concentrating 1 2 3   Moving slowly or fidgety/restless 1 1 2   Suicidal thoughts 0 0 0  PHQ-9 Score 6 13 21    .Marland Kitchen GAD 7 : Generalized Anxiety Score 05/05/2017 01/25/2017 12/15/2016  Nervous, Anxious, on Edge 1 1 3   Control/stop worrying 1 2 3   Worry too much - different things 0 2 3  Trouble relaxing 0 1 3  Restless 1 3 0   Easily annoyed or irritable 0 0 2  Afraid - awful might happen 1 1 3   Total GAD 7 Score 4 10 17   Anxiety Difficulty - - Very difficult   She is doing much better with mood. She is in counseling every other week. Refilled zoloft for 1 year. Continue counseling. klonapin refilled as needed.  BMP ordered for medication management.   Appears to have some cellulitis of her nose. Given keflex for 7 days. Warm compresses encouraged. Follow up as needed.

## 2017-06-14 ENCOUNTER — Encounter: Payer: Self-pay | Admitting: Physician Assistant

## 2017-06-23 ENCOUNTER — Emergency Department
Admission: EM | Admit: 2017-06-23 | Discharge: 2017-06-23 | Disposition: A | Discharge status: Home / Self Care | Payer: 59 | Source: Home / Self Care | Attending: Family Medicine | Admitting: Family Medicine

## 2017-06-23 ENCOUNTER — Encounter: Payer: Self-pay | Admitting: Emergency Medicine

## 2017-06-23 ENCOUNTER — Emergency Department (INDEPENDENT_AMBULATORY_CARE_PROVIDER_SITE_OTHER): Payer: 59

## 2017-06-23 DIAGNOSIS — M549 Dorsalgia, unspecified: Secondary | ICD-10-CM

## 2017-06-23 DIAGNOSIS — R202 Paresthesia of skin: Secondary | ICD-10-CM

## 2017-06-23 DIAGNOSIS — M5412 Radiculopathy, cervical region: Secondary | ICD-10-CM | POA: Diagnosis not present

## 2017-06-23 DIAGNOSIS — S29012A Strain of muscle and tendon of back wall of thorax, initial encounter: Secondary | ICD-10-CM

## 2017-06-23 MED ORDER — CYCLOBENZAPRINE HCL 10 MG PO TABS: 10.0000 mg | ORAL_TABLET | Freq: Every day | ORAL | 1 refills | Status: DC

## 2017-06-23 MED ORDER — PREDNISONE 20 MG PO TABS: ORAL_TABLET | ORAL | 0 refills | Status: DC

## 2017-06-23 NOTE — ED Provider Notes (Signed)
Janet Clayton [facility]    CSN: [id number] Arrival date & time: 06/23/17  1624     History   Chief Complaint Chief Complaint  Patient presents with  . Shoulder Pain    HPI Janet Clayton is a 32 y.o. female.   Patient complains of 4 week history of pain in her right scapular area.  She recalls no injury or change in activities.  During the past week she has developed pain that radiates radiates from her neck into her right arm.  She visited her chiropractor and the symptoms became worse. She has a past history of L4-L5 disc bulging in 2011-2012.   No cough or chest pain.  She is not a smoker.   The history is provided by the patient.  Shoulder Pain  Location:  Shoulder and arm Shoulder location:  R shoulder Arm location:  R upper arm Injury: no   Pain details:    Quality:  Aching   Radiates to:  R forearm and R upper arm   Severity:  Mild   Onset quality:  Gradual   Duration:  1 week   Timing:  Intermittent   Progression:  Worsening Prior injury to area:  No Worsened by:  Movement Ineffective treatments: chiropractic treatment. Associated symptoms: back pain, neck pain and tingling   Associated symptoms: no decreased range of motion, no fatigue, no fever, no muscle weakness, no numbness, no stiffness and no swelling     History reviewed. No pertinent past medical history.  Patient Active Problem List   Diagnosis Date Noted  . GAD (generalized anxiety disorder) 01/25/2017  . Adjustment reaction with anxiety and depression 12/15/2016  . Sleep disturbances 12/15/2016  . Family history of Alzheimer's disease 12/15/2016  . Hyperlipidemia 06/22/2014  . Dermatofibroma 06/19/2014  . Epidermal cyst 06/18/2014  . Obesity 06/18/2014  . Routine physical examination 06/18/2014    History reviewed. No pertinent surgical history.  OB History    No data available       Home Medications    Prior to Admission medications   Medication Sig Start Date End Date Taking?  Authorizing Provider  cephALEXin (KEFLEX) 500 MG capsule Take 1 capsule (500 mg total) by mouth 2 (two) times daily. For 7 days. 05/05/17   Breeback, Jade L, PA-C  clonazePAM (KLONOPIN) 0.5 MG tablet Take 1 tablet (0.5 mg total) by mouth 2 (two) times daily as needed. 05/05/17   Breeback, Royetta Car, PA-C  cyclobenzaprine (FLEXERIL) 10 MG tablet Take 1 tablet (10 mg total) by mouth at bedtime. 06/23/17   Kandra Nicolas, MD  predniSONE (DELTASONE) 20 MG tablet Take one tab by mouth twice daily for 5 days, then one daily. Take with food. 06/23/17   Kandra Nicolas, MD  sertraline (ZOLOFT) 100 MG tablet Take 1 tablet (100 mg total) by mouth daily. 05/05/17   Breeback, Royetta Car, PA-C  traMADol (ULTRAM) 50 MG tablet Take 1 tablet (50 mg total) by mouth every 6 (six) hours as needed. Moderate to severe pain. 06/25/17   Donella Stade, PA-C    Family History Family History  Problem Relation Age of Onset  . Alzheimer's disease Mother     Social History Social History   Tobacco Use  . Smoking status: Never Smoker  . Smokeless tobacco: Never Used  Substance Use Topics  . Alcohol use: Yes    Alcohol/week: 0.0 oz  . Drug use: No     Allergies   Sulfamethoxazole-trimethoprim; Augmentin [amoxicillin-pot clavulanate]; Codeine; and Sulfa  antibiotics   Review of Systems Review of Systems  Constitutional: Negative for fatigue and fever.  Musculoskeletal: Positive for back pain and neck pain. Negative for stiffness.  All other systems reviewed and are negative.    Physical Exam Triage Vital Signs ED Triage Vitals  Enc Vitals Group     BP 06/23/17 1640 129/88     Pulse Rate 06/23/17 1640 74     Resp --      Temp 06/23/17 1640 98 F (36.7 C)     Temp Source 06/23/17 1640 Oral     SpO2 06/23/17 1640 95 %     Weight 06/23/17 1641 245 lb (111.1 kg)     Height --      Head Circumference --      Peak Flow --      Pain Score 06/23/17 1640 7     Pain Loc --      Pain Edu? --      Excl. in [place]? --     No data found.  Updated Vital Signs BP 129/88 (BP Location: Right Arm)   Pulse 74   Temp 98 F (36.7 C) (Oral)   Wt 245 lb (111.1 kg)   LMP 06/13/2017   SpO2 95%   BMI 39.54 kg/m   Visual Acuity Right Eye Distance:   Left Eye Distance:   Bilateral Distance:    Right Eye Near:   Left Eye Near:    Bilateral Near:     Physical Exam  Constitutional: She appears well-developed and well-nourished. No distress.  HENT:  Head: Normocephalic.  Right Ear: External ear normal.  Left Ear: External ear normal.  Nose: Nose normal.  Mouth/Throat: Oropharynx is clear and moist.  Eyes: Pupils are equal, round, and reactive to light.  Neck: Normal range of motion. Muscular tenderness present.  Cardiovascular: Normal heart sounds.  Pulmonary/Chest: Breath sounds normal.  Abdominal: There is no tenderness.  Musculoskeletal:       Back:       Arms: There is distinct tenderness over medial edge of the right scapula.  Pain elicited by resisted abduction of right shoulder while palpating right rhomboid muscles.   Patient complains of paresthesias in her right arm  as noted on diagram, although there is no tenderness to palpation in these areas.  Lymphadenopathy:    She has no cervical adenopathy.  Neurological: She is alert. No sensory deficit. She exhibits normal muscle tone.  Skin: Skin is warm and dry. No rash noted.  Nursing note and vitals reviewed.    UC Treatments / Results  Labs (all labs ordered are listed, but only abnormal results are displayed) Labs Reviewed - No data to display  EKG  EKG Interpretation None       Radiology No results found.  Procedures Procedures (including critical care time)  Medications Ordered in UC Medications - No data to display   Initial Impression / Assessment and Plan / UC Course  I have reviewed the triage vital signs and the nursing notes.  Pertinent labs & imaging results that were available during my care of the patient  were reviewed by me and considered in my medical decision making (see chart for details).    Other than loss of cervical spine lordosis, negative C-spine X-ray reassuring. Suspect cervical muscle spasm. Begin prednisone burst/taper.  Rx for Flexeril at bedtime. Apply ice pack for 20 to 30 minutes, 3 to 4 times daily  Continue until pain and swelling decrease.  May take  Tylenol as needed for pain.  Begin range of motion and stretching exercises as tolerated Followup with Dr. Aundria Mems or Dr. Lynne Leader ([facility]) if not improving about two weeks.     Final Clinical Impressions(s) / UC Diagnoses   Final diagnoses:  Cervical radiculopathy  Rhomboid muscle strain, initial encounter    ED Discharge Orders        Ordered    predniSONE (DELTASONE) 20 MG tablet     06/23/17 1749    cyclobenzaprine (FLEXERIL) 10 MG tablet  Daily at bedtime     06/23/17 1749         Kandra Nicolas, MD 06/27/17 1254

## 2017-06-23 NOTE — ED Triage Notes (Signed)
Pt c/o right shoulder pain that radiates down her arm for the last month. States she saw her chiropractor and pain has worsened. Denies injury./

## 2017-06-23 NOTE — Discharge Instructions (Signed)
Apply ice pack for 20 to 30 minutes, 3 to 4 times daily  Continue until pain and swelling decrease. May take Tylenol as needed for pain.  Begin range of motion and stretching exercises as tolerated. °

## 2017-06-25 ENCOUNTER — Encounter: Payer: Self-pay | Admitting: Physician Assistant

## 2017-06-25 MED ORDER — TRAMADOL HCL 50 MG PO TABS: 50.0000 mg | ORAL_TABLET | Freq: Four times a day (QID) | ORAL | 0 refills | Status: DC | PRN

## 2017-08-28 ENCOUNTER — Emergency Department
Admission: EM | Admit: 2017-08-28 | Discharge: 2017-08-28 | Disposition: A | Discharge status: Home / Self Care | Payer: 59 | Source: Home / Self Care | Attending: Family Medicine | Admitting: Family Medicine

## 2017-08-28 ENCOUNTER — Emergency Department (INDEPENDENT_AMBULATORY_CARE_PROVIDER_SITE_OTHER): Payer: 59

## 2017-08-28 ENCOUNTER — Encounter: Payer: Self-pay | Admitting: Emergency Medicine

## 2017-08-28 DIAGNOSIS — R0981 Nasal congestion: Secondary | ICD-10-CM | POA: Diagnosis not present

## 2017-08-28 MED ORDER — METHYLPREDNISOLONE ACETATE 80 MG/ML IJ SUSP
80.0000 mg | Freq: Once | INTRAMUSCULAR | Status: AC
  Administered 2017-08-28: 80 mg via INTRAMUSCULAR

## 2017-08-28 MED ORDER — CEFDINIR 300 MG PO CAPS: 300.0000 mg | ORAL_CAPSULE | Freq: Two times a day (BID) | ORAL | 0 refills | Status: DC

## 2017-08-28 NOTE — ED Provider Notes (Signed)
[name]    CSN: [id number] Arrival date & time: 08/28/17  1442     History   Chief Complaint Chief Complaint  Patient presents with  . Facial Pain    HPI Janet Clayton is a 32 y.o. female.   Patient has had intermittent sneezing and sinus congestion for two weeks.  She has become worse during the past four days with chills/sweats and increased facial pressure.  She has developed a cough.  She has seasonal rhinitis and a history of recurrent sinusitis.  The history is provided by the patient.    History reviewed. No pertinent past medical history.  Patient Active Problem List   Diagnosis Date Noted  . GAD (generalized anxiety disorder) 01/25/2017  . Adjustment reaction with anxiety and depression 12/15/2016  . Sleep disturbances 12/15/2016  . Family history of Alzheimer's disease 12/15/2016  . Hyperlipidemia 06/22/2014  . Dermatofibroma 06/19/2014  . Epidermal cyst 06/18/2014  . Obesity 06/18/2014  . Routine physical examination 06/18/2014    History reviewed. No pertinent surgical history.  OB History   None      Home Medications    Prior to Admission medications   Medication Sig Start Date End Date Taking? Authorizing Provider  cefdinir (OMNICEF) 300 MG capsule Take 1 capsule (300 mg total) by mouth 2 (two) times daily. 08/28/17   Kandra Nicolas, MD  clonazePAM (KLONOPIN) 0.5 MG tablet Take 1 tablet (0.5 mg total) by mouth 2 (two) times daily as needed. 05/05/17   Breeback, Jade L, PA-C  sertraline (ZOLOFT) 100 MG tablet Take 1 tablet (100 mg total) by mouth daily. 05/05/17   Donella Stade, PA-C    Family History Family History  Problem Relation Age of Onset  . Alzheimer's disease Mother     Social History Social History   Tobacco Use  . Smoking status: Never Smoker  . Smokeless tobacco: Never Used  Substance Use Topics  . Alcohol use: Yes    Alcohol/week: 0.0 oz  . Drug use: No     Allergies     Sulfamethoxazole-trimethoprim; Augmentin [amoxicillin-pot clavulanate]; Codeine; and Sulfa antibiotics   Review of Systems Review of Systems No sore throat + cough No pleuritic pain No wheezing + nasal congestion + post-nasal drainage + sinus pain/pressure No itchy/red eyes ? earache No hemoptysis No SOB No fever, + chills/sweats No nausea No vomiting No abdominal pain No diarrhea No urinary symptoms No skin rash + fatigue No myalgias + headache Used OTC meds without relief   Physical Exam Triage Vital Signs ED Triage Vitals [08/28/17 1601]  Enc Vitals Group     BP 119/80     Pulse Rate 75     Resp      Temp 98.2 F (36.8 C)     Temp Source Oral     SpO2 97 %     Weight 251 lb 8 oz (114.1 kg)     Height 5\' 6"  (1.676 m)     Head Circumference      Peak Flow      Pain Score 7     Pain Loc      Pain Edu?      Excl. in [place]?    No data found.  Updated Vital Signs BP 119/80 (BP Location: Right Arm)   Pulse 75   Temp 98.2 F (36.8 C) (Oral)   Ht 5\' 6"  (1.676 m)   Wt 251 lb 8 oz (114.1 kg)   LMP 08/23/2017  SpO2 97%   BMI 40.59 kg/m   Visual Acuity Right Eye Distance:   Left Eye Distance:   Bilateral Distance:    Right Eye Near:   Left Eye Near:    Bilateral Near:     Physical Exam Nursing notes and Vital Signs reviewed. Appearance:  Patient appears stated age, and in no acute distress Eyes:  Pupils are equal, round, and reactive to light and accomodation.  Extraocular movement is intact.  Conjunctivae are not inflamed  Ears:  Canals normal.  Tympanic membranes normal.  Nose:  Congested turbinates.   Maxillary sinus tenderness is present.  Pharynx:  Normal Neck:  Supple.  Tender shotty lateral nodes Lungs:  Clear to auscultation.  Breath sounds are equal.  Moving air well. Heart:  Regular rate and rhythm without murmurs, rubs, or gallops.  Abdomen:  Nontender without masses or hepatosplenomegaly.  Bowel sounds are present.  No CVA or flank  tenderness.  Extremities:  No edema.  Skin:  No rash present.    UC Treatments / Results  Labs (all labs ordered are listed, but only abnormal results are displayed) Labs Reviewed - No data to display  EKG None  Radiology Dg Sinuses Complete  Result Date: 08/28/2017 CLINICAL DATA:  Pt c/o bi-lateral sinus congestion x 2 weeks with more congestion on left side. EXAM: PARANASAL SINUSES - COMPLETE 3 + VIEW COMPARISON:  None. FINDINGS: The paranasal sinus are aerated. There is no evidence of sinus opacification air-fluid levels or mucosal thickening. No significant bone abnormalities are seen. IMPRESSION: Negative. Electronically Signed   By: Nolon Nations M.D.   On: 08/28/2017 16:42    Procedures Procedures (including critical care time)  Medications Ordered in UC Medications  methylPREDNISolone acetate (DEPO-MEDROL) injection 80 mg (has no administration in time range)    Initial Impression / Assessment and Plan / UC Course  I have reviewed the triage vital signs and the nursing notes.  Pertinent labs & imaging results that were available during my care of the patient were reviewed by me and considered in my medical decision making (see chart for details).    ?developing viral URI Administered Depo Medrol 80mg  IM  Because of her history of recurrent sinusitis, will begin Omnicef. Followup with Family Doctor if not improved in about 10 days.   Final Clinical Impressions(s) / UC Diagnoses   Final diagnoses:  Sinus congestion     Discharge Instructions     Continue plain guaifenesin (1200mg  extended release tabs such as Mucinex) twice daily, with plenty of water, for cough and congestion.  May add Pseudoephedrine (30mg , one or two every 4 to 6 hours) for sinus congestion.  Get adequate rest.   May use Afrin nasal spray (or generic oxymetazoline) each morning for about 5 days and then discontinue.  Also recommend using saline nasal spray several times daily and saline  nasal irrigation (AYR is a common brand).    Stop all antihistamines for now, and other non-prescription cough/cold preparations. May take Ibuprofen 200mg , 4 tabs every 8 hours with food for chest/sternum discomfort.      ED Prescriptions    Medication Sig Dispense Auth. Provider   cefdinir (OMNICEF) 300 MG capsule Take 1 capsule (300 mg total) by mouth 2 (two) times daily. 20 capsule Kandra Nicolas, MD        Kandra Nicolas, MD 09/04/17 [phone]

## 2017-08-28 NOTE — ED Triage Notes (Signed)
Patient c/o possible sinus infection, chest and head congestion, productive cough, bilateral ear pain, drainage, tried Advil cold & sinus, Claritin and Mucinex.

## 2017-08-28 NOTE — Discharge Instructions (Addendum)
Continue plain guaifenesin (1200mg  extended release tabs such as Mucinex) twice daily, with plenty of water, for cough and congestion.  May add Pseudoephedrine (30mg , one or two every 4 to 6 hours) for sinus congestion.  Get adequate rest.   May use Afrin nasal spray (or generic oxymetazoline) each morning for about 5 days and then discontinue.  Also recommend using saline nasal spray several times daily and saline nasal irrigation (AYR is a common brand).    Stop all antihistamines for now, and other non-prescription cough/cold preparations. May take Ibuprofen 200mg , 4 tabs every 8 hours with food for chest/sternum discomfort.

## 2017-08-29 ENCOUNTER — Encounter: Payer: Self-pay | Admitting: Physician Assistant

## 2017-08-29 ENCOUNTER — Other Ambulatory Visit: Payer: Self-pay | Admitting: Physician Assistant

## 2017-08-29 DIAGNOSIS — F4323 Adjustment disorder with mixed anxiety and depressed mood: Secondary | ICD-10-CM

## 2017-12-05 ENCOUNTER — Emergency Department
Admission: EM | Admit: 2017-12-05 | Discharge: 2017-12-05 | Disposition: A | Discharge status: Home / Self Care | Payer: Self-pay | Source: Home / Self Care | Attending: Family Medicine | Admitting: Family Medicine

## 2017-12-05 DIAGNOSIS — N3001 Acute cystitis with hematuria: Secondary | ICD-10-CM

## 2017-12-05 DIAGNOSIS — R309 Painful micturition, unspecified: Secondary | ICD-10-CM

## 2017-12-05 MED ORDER — PHENAZOPYRIDINE HCL 200 MG PO TABS: 200.0000 mg | ORAL_TABLET | Freq: Three times a day (TID) | ORAL | 0 refills | Status: DC

## 2017-12-05 MED ORDER — NITROFURANTOIN MONOHYD MACRO 100 MG PO CAPS: 100.0000 mg | ORAL_CAPSULE | Freq: Two times a day (BID) | ORAL | 0 refills | Status: DC

## 2017-12-05 NOTE — ED Triage Notes (Signed)
PT mc/o UTI sxs since Fri. Felt like she had yeast infection last week, self treated with Monestat then UTI sxs started.

## 2017-12-05 NOTE — ED Provider Notes (Signed)
[name]    CSN: [id number] Arrival date & time: 12/05/17  1423     History   Chief Complaint Chief Complaint  Patient presents with  . Urinary Tract Infection    HPI Janet Clayton is a 32 y.o. female.   Patient felt like she had a vaginal yeast infection 6 days ago and treated her symptoms with Monistat.  The vaginal symptoms resolved and three days ago she developed dysuria, frequency, mild hematuria, and malodorous urine.  No abdominal or pelvic pain.  No fevers, chills, and sweats.  The history is provided by the patient.  Dysuria  Pain quality:  Burning Pain severity:  Mild Onset quality:  Gradual Duration:  3 days Timing:  Constant Progression:  Worsening Chronicity:  New Recent urinary tract infections: no   Relieved by:  Nothing Worsened by:  Nothing Ineffective treatments:  Phenazopyridine Urinary symptoms: discolored urine, foul-smelling urine, frequent urination, hematuria and hesitancy   Urinary symptoms: no bladder incontinence   Associated symptoms: no abdominal pain, no fever, no flank pain, no genital lesions, no nausea, no vaginal discharge and no vomiting     No past medical history on file.  Patient Active Problem List   Diagnosis Date Noted  . GAD (generalized anxiety disorder) 01/25/2017  . Adjustment reaction with anxiety and depression 12/15/2016  . Sleep disturbances 12/15/2016  . Family history of Alzheimer's disease 12/15/2016  . Hyperlipidemia 06/22/2014  . Dermatofibroma 06/19/2014  . Epidermal cyst 06/18/2014  . Obesity 06/18/2014  . Routine physical examination 06/18/2014    No past surgical history on file.  OB History   None      Home Medications    Prior to Admission medications   Medication Sig Start Date End Date Taking? Authorizing Provider  cefdinir (OMNICEF) 300 MG capsule Take 1 capsule (300 mg total) by mouth 2 (two) times daily. 08/28/17   Kandra Nicolas, MD  clonazePAM (KLONOPIN) 0.5 MG  tablet TAKE 1 TABLET BY MOUTH TWICE A DAY 08/31/17   Breeback, Jade L, PA-C  nitrofurantoin, macrocrystal-monohydrate, (MACROBID) 100 MG capsule Take 1 capsule (100 mg total) by mouth 2 (two) times daily. Take with food. 12/05/17   Kandra Nicolas, MD  phenazopyridine (PYRIDIUM) 200 MG tablet Take 1 tablet (200 mg total) by mouth 3 (three) times daily. Take after meals. 12/05/17   Kandra Nicolas, MD  sertraline (ZOLOFT) 100 MG tablet Take 1 tablet (100 mg total) by mouth daily. 05/05/17   Donella Stade, PA-C    Family History Family History  Problem Relation Age of Onset  . Alzheimer's disease Mother     Social History Social History   Tobacco Use  . Smoking status: Never Smoker  . Smokeless tobacco: Never Used  Substance Use Topics  . Alcohol use: Yes    Alcohol/week: 0.0 standard drinks  . Drug use: No     Allergies   Sulfamethoxazole-trimethoprim; Augmentin [amoxicillin-pot clavulanate]; Codeine; and Sulfa antibiotics   Review of Systems Review of Systems  Constitutional: Negative for fever.  Gastrointestinal: Negative for abdominal pain, nausea and vomiting.  Genitourinary: Positive for dysuria. Negative for flank pain and vaginal discharge.  All other systems reviewed and are negative.    Physical Exam Triage Vital Signs ED Triage Vitals [12/05/17 1445]  Enc Vitals Group     BP      Pulse Rate (P) 97     Resp      Temp (P) 97.8 F (36.6 C)  Temp Source (P) Oral     SpO2      Weight      Height      Head Circumference      Peak Flow      Pain Score      Pain Loc      Pain Edu?      Excl. in [place]?    No data found.  Updated Vital Signs Pulse (P) 97   Temp (P) 97.8 F (36.6 C) (Oral)   Visual Acuity Right Eye Distance:   Left Eye Distance:   Bilateral Distance:    Right Eye Near:   Left Eye Near:    Bilateral Near:     Physical Exam Nursing notes and Vital Signs reviewed. Appearance:  Patient appears stated age, and in no acute  distress.    Eyes:  Pupils are equal, round, and reactive to light and accomodation.  Extraocular movement is intact.  Conjunctivae are not inflamed   Pharynx:  Normal; moist mucous membranes  Neck:  Supple.  No adenopathy Lungs:  Clear to auscultation.  Breath sounds are equal.  Moving air well. Heart:  Regular rate and rhythm without murmurs, rubs, or gallops.  Abdomen:  Nontender without masses or hepatosplenomegaly.  Bowel sounds are present.  No CVA or flank tenderness.  Extremities:  No edema.  Skin:  No rash present.     UC Treatments / Results  Labs (all labs ordered are listed, but only abnormal results are displayed) Labs Reviewed  URINE CULTURE    EKG None  Radiology No results found.  Procedures Procedures (including critical care time)  Medications Ordered in UC Medications - No data to display  Initial Impression / Assessment and Plan / UC Course  I have reviewed the triage vital signs and the nursing notes.  Pertinent labs & imaging results that were available during my care of the patient were reviewed by me and considered in my medical decision making (see chart for details).    Urine culture pending. Begin Macrobid 100mg  BID for one week. Rx for Pyridium   Followup with Family Doctor if not improved in one week.    Final Clinical Impressions(s) / UC Diagnoses   Final diagnoses:  Painful urination  Acute cystitis with hematuria     Discharge Instructions     Increase fluid intake. If symptoms become significantly worse during the night or over the weekend, proceed to the local emergency room.     ED Prescriptions    Medication Sig Dispense Auth. Provider   nitrofurantoin, macrocrystal-monohydrate, (MACROBID) 100 MG capsule Take 1 capsule (100 mg total) by mouth 2 (two) times daily. Take with food. 14 capsule Kandra Nicolas, MD   phenazopyridine (PYRIDIUM) 200 MG tablet Take 1 tablet (200 mg total) by mouth 3 (three) times daily. Take after  meals. 6 tablet Kandra Nicolas, MD        Kandra Nicolas, MD 12/07/17 2014

## 2017-12-05 NOTE — Discharge Instructions (Addendum)
Increase fluid intake. °If symptoms become significantly worse during the night or over the weekend, proceed to the local emergency room.  °

## 2017-12-07 ENCOUNTER — Telehealth: Payer: Self-pay

## 2017-12-07 LAB — URINE CULTURE
MICRO NUMBER:: 90983295
SPECIMEN QUALITY: ADEQUATE

## 2017-12-10 ENCOUNTER — Telehealth: Payer: Self-pay

## 2017-12-10 NOTE — Telephone Encounter (Signed)
LM with culture results. Advised pt to finish antibiotics and increase fluids. F/U with PCP if not better in a week.

## 2017-12-27 ENCOUNTER — Encounter: Payer: Self-pay | Admitting: Osteopathic Medicine

## 2017-12-27 ENCOUNTER — Ambulatory Visit (INDEPENDENT_AMBULATORY_CARE_PROVIDER_SITE_OTHER): Payer: BLUE CROSS/BLUE SHIELD | Admitting: Osteopathic Medicine

## 2017-12-27 VITALS — BP 125/78 | HR 95 | Temp 98.5°F | Wt 261.8 lb

## 2017-12-27 DIAGNOSIS — G43809 Other migraine, not intractable, without status migrainosus: Secondary | ICD-10-CM

## 2017-12-27 DIAGNOSIS — R42 Dizziness and giddiness: Secondary | ICD-10-CM | POA: Diagnosis not present

## 2017-12-27 MED ORDER — SUMATRIPTAN SUCCINATE 50 MG PO TABS: 50.0000 mg | ORAL_TABLET | ORAL | 0 refills | Status: DC | PRN

## 2017-12-27 MED ORDER — MECLIZINE HCL 25 MG PO TABS: 25.0000 mg | ORAL_TABLET | Freq: Three times a day (TID) | ORAL | 0 refills | Status: DC | PRN

## 2017-12-27 NOTE — Progress Notes (Signed)
HPI: Janet Clayton is a 32 y.o. female who  has no past medical history on file.  she presents to [facility] today, 12/27/17,  for chief complaint of:  Dizziness  Got out of the shower and went to blow dry her hair. Got dizzy when she tilted her head to blow dry her hair. Has been going on about 1.5-2 weeks. Brother in an EMT and lives next door, he checked BP and pulse at the time and every time was reportedly fine. Pt denies possibility of pregnancy. Glc was also checked 3 times, was 80, went to 84, brother was concerned that this might be low. She feels ok today but just wanted to get checked out. Feels tired, had a bit ofa headache prior to all this.      Past medical history, surgical history, and family history reviewed.  Current medication list and allergy/intolerance information reviewed.   (See remainder of HPI, ROS, Phys Exam below)      ASSESSMENT/PLAN: The primary encounter diagnosis was Vertigo. A diagnosis of Other migraine without status migrainosus, not intractable was also pertinent to this visit.  Sounds more like vertigo than lightheadedness due to orthostatic hypotension, hypoglycemia, or other.  Possibly migraine variant affecting vestibular system.     Meds ordered this encounter  Medications  . SUMAtriptan (IMITREX) 50 MG tablet    Sig: Take 1 tablet (50 mg total) by mouth every 2 (two) hours as needed for migraine. May repeat in 2 hours if headache persists or recurs.    Dispense:  30 tablet    Refill:  0  . meclizine (ANTIVERT) 25 MG tablet    Sig: Take 1 tablet (25 mg total) by mouth 3 (three) times daily as needed for dizziness.    Dispense:  30 tablet    Refill:  0    Patient Instructions  Plan: Will try migraine medications as needed (imitex aka sumatriptan) Will try home maneuvers if persistent (see printout) Will try nausea/dizziness medications as needed (meclizine)    Follow-up plan: Return if symptoms  worsen or fail to improve.             ############################################ ############################################ ############################################ ############################################    Outpatient Encounter Medications as of 12/27/2017  Medication Sig Note  . clonazePAM (KLONOPIN) 0.5 MG tablet TAKE 1 TABLET BY MOUTH TWICE A DAY 12/27/2017: As per pt, PRN  . sertraline (ZOLOFT) 100 MG tablet Take 1 tablet (100 mg total) by mouth daily.   . cefdinir (OMNICEF) 300 MG capsule Take 1 capsule (300 mg total) by mouth 2 (two) times daily. (Patient not taking: Reported on 12/27/2017)   . nitrofurantoin, macrocrystal-monohydrate, (MACROBID) 100 MG capsule Take 1 capsule (100 mg total) by mouth 2 (two) times daily. Take with food. (Patient not taking: Reported on 12/27/2017)   . phenazopyridine (PYRIDIUM) 200 MG tablet Take 1 tablet (200 mg total) by mouth 3 (three) times daily. Take after meals. (Patient not taking: Reported on 12/27/2017)    No facility-administered encounter medications on file as of 12/27/2017.    Allergies  Allergen Reactions  . Sulfamethoxazole-Trimethoprim Nausea And Vomiting  . Amoxicillin-Pot Clavulanate Nausea Only, Rash and Nausea And Vomiting  . Codeine Rash and Nausea And Vomiting  . Sulfa Antibiotics Rash and Nausea And Vomiting      Review of Systems:  Constitutional: No recent illness  HEENT: +headache, no vision change  Cardiac: No  chest pain, No  pressure, No palpitations  Respiratory:  No  shortness of breath. No  Cough  Gastrointestinal: No  abdominal pain, no change on bowel habits  Musculoskeletal: No new myalgia/arthralgia  Skin: No  Rash  Hem/Onc: No  easy bruising/bleeding, No  abnormal lumps/bumps  Neurologic: No  weakness, +Dizziness as per HPI  Psychiatric: No  concerns with depression, No  concerns with anxiety  Exam:  BP 125/78 (BP Location: Left Arm, Patient Position: Sitting, Cuff Size:  Large)   Pulse 95   Temp 98.5 F (36.9 C) (Oral)   Wt 261 lb 12.8 oz (118.8 kg)   BMI 42.26 kg/m   Constitutional: VS see above. General Appearance: alert, well-developed, well-nourished, NAD  Eyes: Normal lids and conjunctive, non-icteric sclera  Ears, Nose, Mouth, Throat: MMM, Normal external inspection ears/nares/mouth/lips/gums.  Tympanic membranes normal bilaterally.  Neck: No masses, trachea midline.   Respiratory: Normal respiratory effort. no wheeze, no rhonchi, no rales  Cardiovascular: S1/S2 normal, no murmur, no rub/gallop auscultated. RRR.   Musculoskeletal: Gait normal. Symmetric and independent movement of all extremities  Neurological: Normal balance/coordination. No tremor.  EOMI, PERRLA.  No nystagmus.  Cerebellar reflexes intact.  Motor and sensation intact and symmetric bilaterally.  Negative Dix-Hallpike maneuver bilaterally.  Skin: warm, dry, intact.   Psychiatric: Normal judgment/insight. Normal mood and affect. Oriented x3.   Visit summary with medication list and pertinent instructions was printed for patient to review, advised to alert [place] if any changes needed. All questions at time of visit were answered - patient instructed to contact office with any additional concerns. ER/RTC precautions were reviewed with the patient and understanding verbalized.   Follow-up plan: Return if symptoms worsen or fail to improve.    Please note: voice recognition software was used to produce this document, and typos may escape review. Please contact Dr. Sheppard Coil for any needed clarifications.

## 2017-12-27 NOTE — Patient Instructions (Signed)
Plan: Will try migraine medications as needed (imitex aka sumatriptan) Will try home maneuvers if persistent (see printout) Will try nausea/dizziness medications as needed (meclizine)

## 2017-12-28 ENCOUNTER — Encounter: Payer: Self-pay | Admitting: Osteopathic Medicine

## 2017-12-28 DIAGNOSIS — G43909 Migraine, unspecified, not intractable, without status migrainosus: Secondary | ICD-10-CM | POA: Insufficient documentation

## 2017-12-28 DIAGNOSIS — R42 Dizziness and giddiness: Secondary | ICD-10-CM | POA: Insufficient documentation

## 2018-01-18 DIAGNOSIS — F411 Generalized anxiety disorder: Secondary | ICD-10-CM | POA: Diagnosis not present

## 2018-01-18 DIAGNOSIS — F330 Major depressive disorder, recurrent, mild: Secondary | ICD-10-CM | POA: Diagnosis not present

## 2018-02-16 ENCOUNTER — Encounter: Payer: Self-pay | Admitting: Physician Assistant

## 2018-02-18 ENCOUNTER — Encounter: Payer: Self-pay | Admitting: Physician Assistant

## 2018-02-18 ENCOUNTER — Ambulatory Visit (INDEPENDENT_AMBULATORY_CARE_PROVIDER_SITE_OTHER): Payer: BLUE CROSS/BLUE SHIELD | Admitting: Physician Assistant

## 2018-02-18 VITALS — BP 124/82 | HR 92 | Temp 98.5°F | Ht 66.0 in | Wt 266.8 lb

## 2018-02-18 DIAGNOSIS — Z566 Other physical and mental strain related to work: Secondary | ICD-10-CM | POA: Insufficient documentation

## 2018-02-18 DIAGNOSIS — R41840 Attention and concentration deficit: Secondary | ICD-10-CM | POA: Insufficient documentation

## 2018-02-18 DIAGNOSIS — N898 Other specified noninflammatory disorders of vagina: Secondary | ICD-10-CM

## 2018-02-18 MED ORDER — FLUCONAZOLE 150 MG PO TABS: 150.0000 mg | ORAL_TABLET | Freq: Once | ORAL | 0 refills | Status: AC

## 2018-02-18 MED ORDER — METRONIDAZOLE 500 MG PO TABS: 500.0000 mg | ORAL_TABLET | Freq: Two times a day (BID) | ORAL | 0 refills | Status: DC

## 2018-02-18 NOTE — Patient Instructions (Signed)

## 2018-02-18 NOTE — Progress Notes (Signed)
Subjective:    Patient ID: Janet Clayton, female    DOB: 01/13/1986, 32 y.o.   MRN: [id number]  HPI  Pt is a 32 yo female with anxiety/depression who presents to the clinic with vaginal discharge/itching. She has taken monistat with no improvement. She is sexually active with same partner. She denies any lower abdominal pain, flank pain, n/v/d. She does not use condoms. She has a scant odor. No fever, chills.   She does feel like her focus is getting worse. She is at a stressful job with a lot of responsibilities. She feels like she "will daze off" and get "easily distracted". She is on zoloft and been on for a good while. She denies any worsening depression but her anxiety she will have good and bad days. She does exercise a few times a week.   .. Active Ambulatory Problems    Diagnosis Date Noted  . Epidermal cyst 06/18/2014  . Obesity 06/18/2014  . Routine physical examination 06/18/2014  . Dermatofibroma 06/19/2014  . Hyperlipidemia 06/22/2014  . Adjustment reaction with anxiety and depression 12/15/2016  . Sleep disturbances 12/15/2016  . Family history of Alzheimer's disease 12/15/2016  . GAD (generalized anxiety disorder) 01/25/2017  . Migraine 12/28/2017  . Vertigo 12/28/2017  . Inattention 02/18/2018  . Stress at work 02/18/2018   Resolved Ambulatory Problems    Diagnosis Date Noted  . No Resolved Ambulatory Problems   No Additional Past Medical History     Review of Systems  All other systems reviewed and are negative.      Objective:   Physical Exam  Constitutional: She appears well-developed and well-nourished.  HENT:  Head: Normocephalic and atraumatic.  Cardiovascular: Normal rate and regular rhythm.  Pulmonary/Chest: Effort normal and breath sounds normal.  No CVA tenderness.   Abdominal: Soft. She exhibits no distension. There is no rebound and no guarding.  Neurological: She is alert.  Psychiatric: She has a normal mood and affect. Her behavior is  normal.          Assessment & Plan:  Marland KitchenMarland KitchenYuriko was seen today for vaginal itching.  Diagnoses and all orders for this visit:  Vaginal itching -     WET PREP FOR TRICH, YEAST, CLUE -     metroNIDAZOLE (FLAGYL) 500 MG tablet; Take 1 tablet (500 mg total) by mouth 2 (two) times daily. -     fluconazole (DIFLUCAN) 150 MG tablet; Take 1 tablet (150 mg total) by mouth once for 1 dose. Repeat in 48-72 hours.  Vaginal discharge -     WET PREP FOR TRICH, YEAST, CLUE -     metroNIDAZOLE (FLAGYL) 500 MG tablet; Take 1 tablet (500 mg total) by mouth 2 (two) times daily. -     fluconazole (DIFLUCAN) 150 MG tablet; Take 1 tablet (150 mg total) by mouth once for 1 dose. Repeat in 48-72 hours.  Stress at work  Inattention  pt declines STI testing today. If symptoms persist then needs to have this. Discussed STI protection with condoms.  Due to symptoms will treat for yeast and BV. Wet prep ordered.   ADHD symptom checklist was given. She will return via mychart back to clinic. Discussed it is likely that stress/anxiety inducing more lack of focus since only recently became symptomatic. We could always send to official testing. For now I would work on list. Maybe add a little caffiene in the mornings to help stay more focused. Could consider wellbutrin as well. Follow up in 4 weeks.

## 2018-02-19 LAB — WET PREP FOR TRICH, YEAST, CLUE
MICRO NUMBER: 91317668
SPECIMEN QUALITY 3963: ADEQUATE

## 2018-02-20 ENCOUNTER — Encounter: Payer: Self-pay | Admitting: Physician Assistant

## 2018-02-20 DIAGNOSIS — N898 Other specified noninflammatory disorders of vagina: Secondary | ICD-10-CM

## 2018-02-20 NOTE — Progress Notes (Signed)
Call pt: nothing found in wet prep. If you continue to have discharge we need to do STI testings as well.

## 2018-02-21 ENCOUNTER — Encounter: Payer: Self-pay | Admitting: Physician Assistant

## 2018-02-21 NOTE — Addendum Note (Signed)
Addended by: Alena Bills R on: 02/21/2018 01:46 PM   Modules accepted: Orders

## 2018-02-21 NOTE — Telephone Encounter (Signed)
Ok to send to lab. Urine gc/chlamydia for patient to go downstairs and give sample.

## 2018-02-21 NOTE — Addendum Note (Signed)
Addended by: Alena Bills R on: 02/21/2018 12:15 PM   Modules accepted: Orders

## 2018-02-22 DIAGNOSIS — N898 Other specified noninflammatory disorders of vagina: Secondary | ICD-10-CM | POA: Diagnosis not present

## 2018-02-23 LAB — C. TRACHOMATIS/N. GONORRHOEAE RNA
C. trachomatis RNA, TMA: NOT DETECTED
N. GONORRHOEAE RNA, TMA: NOT DETECTED

## 2018-02-23 NOTE — Telephone Encounter (Signed)
STI testing negative

## 2018-02-27 DIAGNOSIS — R509 Fever, unspecified: Secondary | ICD-10-CM | POA: Diagnosis not present

## 2018-02-27 DIAGNOSIS — J029 Acute pharyngitis, unspecified: Secondary | ICD-10-CM | POA: Diagnosis not present

## 2018-05-23 ENCOUNTER — Encounter: Payer: Self-pay | Admitting: Physician Assistant

## 2018-05-23 ENCOUNTER — Ambulatory Visit (INDEPENDENT_AMBULATORY_CARE_PROVIDER_SITE_OTHER): Payer: BLUE CROSS/BLUE SHIELD | Admitting: Physician Assistant

## 2018-05-23 ENCOUNTER — Other Ambulatory Visit: Payer: Self-pay | Admitting: Physician Assistant

## 2018-05-23 VITALS — BP 116/86 | HR 82 | Ht 66.0 in | Wt 267.0 lb

## 2018-05-23 DIAGNOSIS — Z113 Encounter for screening for infections with a predominantly sexual mode of transmission: Secondary | ICD-10-CM

## 2018-05-23 DIAGNOSIS — A630 Anogenital (venereal) warts: Secondary | ICD-10-CM

## 2018-05-23 DIAGNOSIS — Z01411 Encounter for gynecological examination (general) (routine) with abnormal findings: Secondary | ICD-10-CM | POA: Diagnosis not present

## 2018-05-23 DIAGNOSIS — F411 Generalized anxiety disorder: Secondary | ICD-10-CM

## 2018-05-23 DIAGNOSIS — F4323 Adjustment disorder with mixed anxiety and depressed mood: Secondary | ICD-10-CM

## 2018-05-23 MED ORDER — IMIQUIMOD 5 % EX CREA: TOPICAL_CREAM | CUTANEOUS | 0 refills | Status: DC

## 2018-05-23 NOTE — Progress Notes (Deleted)
   Subjective:    Patient ID: Janet Clayton, female    DOB: [birth year], 33 y.o.   MRN: [id number]  HPI Noticed it in the shower bump/skin tag and never notice. A little sore.  No discharge/itching/  randoming dump boyfriend.    Review of Systems     Objective:   Physical Exam        Assessment & Plan:

## 2018-05-23 NOTE — Progress Notes (Signed)
Subjective:    Patient ID: Janet Clayton, female    DOB: 1985-05-22, 33 y.o.   MRN: [id number]  HPI: This is a 33 yo female who presents to the clinic today concerning a bump in her vaginal area. She states that she was showering about 1 week ago and noticed a bump. It is not painful, itchy, or otherwise symptomatic. She does not complain of any irregular vaginal discharge, odor, dysuria, hematuria. She states that she was not sexually active for 8 years until she had one boyfriend for the previous 4 months until the relationship ended suddenly and she wishes to be screened for all STDs as they were engaging in sexually activity. She states her partner denied any PMH of STIs.   Her last pap was 3 years ago.     Review of Systems  Constitutional: Negative for chills, fatigue and fever.  HENT: Negative for mouth sores.   Genitourinary: Positive for genital sores. Negative for dysuria, hematuria and vaginal discharge.  Psychiatric/Behavioral: Negative for decreased concentration. The patient is not nervous/anxious.        Objective:   Physical Exam Vitals signs reviewed. Exam conducted with a chaperone present.  Constitutional:      General: She is not in acute distress.    Appearance: She is not ill-appearing.  HENT:     Head: Normocephalic and atraumatic.     Right Ear: External ear normal.     Left Ear: External ear normal.     Mouth/Throat:     Mouth: Mucous membranes are moist.     Pharynx: Oropharynx is clear. No oropharyngeal exudate or posterior oropharyngeal erythema.  Eyes:     General: No scleral icterus.       Right eye: No discharge.        Left eye: No discharge.     Extraocular Movements: Extraocular movements intact.     Conjunctiva/sclera: Conjunctivae normal.     Pupils: Pupils are equal, round, and reactive to light.  Cardiovascular:     Rate and Rhythm: Normal rate.     Pulses: Normal pulses.     Heart sounds: Normal heart sounds.  Pulmonary:     Effort:  Pulmonary effort is normal. No respiratory distress.     Breath sounds: Normal breath sounds. No wheezing, rhonchi or rales.  Abdominal:     General: Abdomen is flat. Bowel sounds are normal. There is no distension.     Tenderness: There is no abdominal tenderness. There is no guarding.  Genitourinary:    Exam position: Lithotomy position.     Pubic Area: No rash.      Labia:        Right: No rash, tenderness or lesion.        Left: No rash, tenderness or lesion.      Urethra: No urethral pain or urethral lesion.    Skin:    General: Skin is warm and dry.     Capillary Refill: Capillary refill takes less than 2 seconds.     Coloration: Skin is not jaundiced.     Findings: No bruising or rash.  Neurological:     Mental Status: She is alert.     Coordination: Coordination normal.     Gait: Gait normal.  Psychiatric:        Mood and Affect: Mood normal.        Behavior: Behavior normal.        Thought Content: Thought content normal.  Assessment & Plan:  Marland KitchenMarland KitchenDiagnoses and all orders for this visit:  Encounter for gynecological examination with abnormal finding -     Cytology - PAP  Routine screening for STI (sexually transmitted infection) -     HIV antibody (with reflex) -     RPR -     HSV(herpes simplex vrs) 1+2 ab-IgG -     HSV(herpes simplex vrs) 1+2 ab-IgM -     SureSwab, Vaginosis/Vaginitis Plus  Genital warts -     imiquimod (ALDARA) 5 % cream; Apply topically 3 (three) times a week. Until total clearance or max 16 weeks. -     SureSwab, Vaginosis/Vaginitis Plus   Pap done today. Discussed safe sex practices.  STI testing done with blood and sure swab.  Discussed HPV vaccine being approved by FDA up to 46. She will call her insurance.  Discussed genital warts. Given topical cream 3x week for 16 weeks or cleared lesion.  HO given.   Marland KitchenVernetta Honey PA-C, have reviewed and agree with the above documentation in it's entirety.

## 2018-05-23 NOTE — Patient Instructions (Signed)
Human Papillomavirus  Human papillomavirus (HPV) is the most common sexually transmitted infection (STI). It easily spreads from person to person (is contagious). HPV can cause genital warts and some cancers. The genital warts can be seen and felt. Also, there may be wartlike areas in the throat. HPV may not have any symptoms. It is possible to have HPV for a long time and not know it. You may spread HPV on to others without knowing it.  Follow these instructions at home:  Medicines  · Take over-the-counter and prescription medicines only as told by your doctor. This include creams for itching or irritation.  · Do not treat genital warts with medicines for hand warts.  How is this prevented?  · Talk with your doctor about getting the HPV shots (vaccines). Males and females between ages 9 and 26 should get the shots. The shots will not work if you already have HPV. Pregnant women should not get the shots.  · After treatment, use condoms during sex. This helps to prevent future infections.  · Have only one sex partner.  · Have a sex partner who does not have other sex partners.  · Get Pap tests as told by your doctor.  General instructions  · Do not touch or scratch warts.  · Do not have sex while you are being treated.  · Do not douche or use tampons during treatment.  · Tell your sex partner about your infection. He or she may also need to be treated.  · If you get pregnant, tell your doctor that you have HPV. Your doctor will monitor you during pregnancy.  · Keep all follow-up visits as told by your doctor. This is important.  Contact a doctor if:  · The treated skin is red, swollen, or painful.  · You have a fever.  · You feel ill.  · You feel lumps or pimples in or around your genital area.  · You have bleeding from the vagina.  · You have bleeding from the area that was treated.  · You have pain during sex.  Summary  · Human papillomavirus (HPV) is the most common sexually transmitted infection (STI). It easily  spreads from person to person.  · Talk with your doctor about getting the HPV shots (vaccines). Males and females between ages 9 and 26 should get the shots.  · HPV may not have any symptoms.  · Use creams for itching or irritation only as told by your doctor.  This information is not intended to replace advice given to you by your health care provider. Make sure you discuss any questions you have with your health care provider.  Document Released: 03/19/2008 Document Revised: 05/18/2016 Document Reviewed: 05/18/2016  Elsevier Interactive Patient Education © 2019 [employer]

## 2018-05-24 ENCOUNTER — Encounter: Payer: Self-pay | Admitting: Physician Assistant

## 2018-05-24 DIAGNOSIS — A630 Anogenital (venereal) warts: Secondary | ICD-10-CM | POA: Insufficient documentation

## 2018-05-24 LAB — PAP IG AND HPV HIGH-RISK: HPV DNA HIGH RISK: DETECTED — AB

## 2018-05-24 MED ORDER — SERTRALINE HCL 100 MG PO TABS: 100.0000 mg | ORAL_TABLET | Freq: Every day | ORAL | 4 refills | Status: DC

## 2018-05-24 NOTE — Progress Notes (Signed)
Waiting on some labs. HIV/RPR negative.

## 2018-05-25 ENCOUNTER — Encounter: Payer: Self-pay | Admitting: Physician Assistant

## 2018-05-25 DIAGNOSIS — R87810 Cervical high risk human papillomavirus (HPV) DNA test positive: Principal | ICD-10-CM

## 2018-05-25 DIAGNOSIS — R87610 Atypical squamous cells of undetermined significance on cytologic smear of cervix (ASC-US): Secondary | ICD-10-CM

## 2018-05-25 DIAGNOSIS — N870 Mild cervical dysplasia: Secondary | ICD-10-CM | POA: Insufficient documentation

## 2018-05-25 NOTE — Progress Notes (Signed)
Call pt: pap results did detected HPV and Atyprical cells. At this point we need to refer you to GYN to have a colposcopy to determine your risk and monitor you more closely. Do you have any preference on where to refer?   Were you able to get the cream?

## 2018-05-25 NOTE — Addendum Note (Signed)
Addended by: Donella Stade on: 05/25/2018 10:27 AM   Modules accepted: Orders

## 2018-05-25 NOTE — Telephone Encounter (Signed)
Approvedon February 4 ([employer]) Effective from 05/24/2018 through 09/12/2018 per insurance and pharmacy aware.

## 2018-05-26 LAB — SURESWAB, VAGINOSIS/VAGINITIS PLUS
ATOPOBIUM VAGINAE: NOT DETECTED Log cells/mL
C. PARAPSILOSIS, DNA: NOT DETECTED
C. albicans, DNA: NOT DETECTED
C. glabrata, DNA: NOT DETECTED
C. trachomatis RNA, TMA: NOT DETECTED
C. tropicalis, DNA: NOT DETECTED
Gardnerella vaginalis: 4.8 Log (cells/mL)
LACTOBACILLUS SPECIES: DETECTED Log cells/mL
MEGASPHAERA SPECIES: NOT DETECTED Log cells/mL
N. gonorrhoeae RNA, TMA: NOT DETECTED
Trichomonas vaginalis RNA: NOT DETECTED

## 2018-05-26 NOTE — Progress Notes (Signed)
Call pt: GC/Chlamydia, yeast, BV, trich all negative. Great news.

## 2018-05-27 LAB — HSV 2 IGM TITER

## 2018-05-27 LAB — HSV 1/2 AB (IGM), IFA W/RFLX TITER
HSV 1 IGM SCREEN: NEGATIVE
HSV 2 IGM SCREEN: POSITIVE — AB

## 2018-05-27 LAB — HSV(HERPES SIMPLEX VRS) I + II AB-IGG: HAV 1 IGG,TYPE SPECIFIC AB: <0.9 index

## 2018-05-27 LAB — RPR: RPR Ser Ql: NONREACTIVE

## 2018-05-27 LAB — HIV ANTIBODY (ROUTINE TESTING W REFLEX): HIV: NONREACTIVE

## 2018-05-30 ENCOUNTER — Encounter: Payer: Self-pay | Admitting: Physician Assistant

## 2018-05-30 DIAGNOSIS — B009 Herpesviral infection, unspecified: Secondary | ICD-10-CM | POA: Insufficient documentation

## 2018-05-30 MED ORDER — VALACYCLOVIR HCL 1 G PO TABS: 1000.0000 mg | ORAL_TABLET | Freq: Two times a day (BID) | ORAL | 0 refills | Status: DC

## 2018-05-30 NOTE — Progress Notes (Signed)
You did test positive for herpes simplex type II which is genital herpes. I would like to treat you will an antiviral since shows recent exposure. You have not made antibodies yet. Once pt aware can send valtrex 1000mg  bid for 7 days. #14 NRF.

## 2018-05-31 NOTE — Telephone Encounter (Signed)
scheduled

## 2018-06-01 ENCOUNTER — Ambulatory Visit: Payer: Self-pay | Admitting: Physician Assistant

## 2018-06-01 ENCOUNTER — Ambulatory Visit: Payer: BLUE CROSS/BLUE SHIELD | Admitting: Physician Assistant

## 2018-06-01 ENCOUNTER — Encounter: Payer: Self-pay | Admitting: Physician Assistant

## 2018-06-01 VITALS — BP 118/69 | HR 81 | Ht 66.0 in | Wt 265.0 lb

## 2018-06-01 DIAGNOSIS — A630 Anogenital (venereal) warts: Secondary | ICD-10-CM

## 2018-06-01 DIAGNOSIS — R87610 Atypical squamous cells of undetermined significance on cytologic smear of cervix (ASC-US): Secondary | ICD-10-CM

## 2018-06-01 DIAGNOSIS — N765 Ulceration of vagina: Secondary | ICD-10-CM | POA: Diagnosis not present

## 2018-06-01 DIAGNOSIS — R87810 Cervical high risk human papillomavirus (HPV) DNA test positive: Secondary | ICD-10-CM

## 2018-06-01 DIAGNOSIS — R768 Other specified abnormal immunological findings in serum: Secondary | ICD-10-CM | POA: Diagnosis not present

## 2018-06-01 DIAGNOSIS — N898 Other specified noninflammatory disorders of vagina: Secondary | ICD-10-CM

## 2018-06-01 DIAGNOSIS — F4323 Adjustment disorder with mixed anxiety and depressed mood: Secondary | ICD-10-CM

## 2018-06-01 MED ORDER — FLUCONAZOLE 150 MG PO TABS: 150.0000 mg | ORAL_TABLET | Freq: Once | ORAL | 0 refills | Status: AC

## 2018-06-01 NOTE — Progress Notes (Signed)
Call pt: no trich, yeast, clue cells. You do NOT need to take diflucan.

## 2018-06-01 NOTE — Progress Notes (Signed)
Subjective:    Patient ID: Janet Clayton, female    DOB: Aug 28, 1985, 33 y.o.   MRN: [id number]  HPI   Patient is a 33 year old female with recent Pap that detected HPV and ASCUS cells as well as HSV-2 IgM positive with titer.  She also had a genital wart detected at last visit.  She has had 2 sexual partners in the last 8 years.  She has never had any sexually transmitted diseases.  She has been using the Aldara cream on her genital wart.  She feels like the wart has resolved.  She is having some vaginal irritation and pain with ulceration along the labia sides.  She is very concerned about these labs and would like to come in and discuss with me.   ... Active Ambulatory Problems    Diagnosis Date Noted  . Epidermal cyst 06/18/2014  . Obesity 06/18/2014  . Routine physical examination 06/18/2014  . Dermatofibroma 06/19/2014  . Hyperlipidemia 06/22/2014  . Adjustment reaction with anxiety and depression 12/15/2016  . Sleep disturbances 12/15/2016  . Family history of Alzheimer's disease 12/15/2016  . GAD (generalized anxiety disorder) 01/25/2017  . Migraine 12/28/2017  . Vertigo 12/28/2017  . Inattention 02/18/2018  . Stress at work 02/18/2018  . Genital warts 05/24/2018  . Atypical squamous cell changes of undetermined significance (ASCUS) on cervical cytology with positive high risk human papilloma virus (HPV) 05/25/2018  . HSV-2 (herpes simplex virus 2) infection 05/30/2018   Resolved Ambulatory Problems    Diagnosis Date Noted  . No Resolved Ambulatory Problems   No Additional Past Medical History     Review of Systems See HPI.     Objective:   Physical Exam Vitals signs reviewed.  Genitourinary:    Exam position: Supine.     Labia:        Right: Tenderness and lesion present.        Left: Tenderness and lesion present.      Urethra: Urethral swelling present.    Neurological:     Mental Status: She is alert.  Psychiatric:     Comments: Tearful.             Assessment & Plan:  Marland KitchenMarland KitchenDiagnoses and all orders for this visit:  Vaginal discharge -     WET PREP FOR TRICH, YEAST, CLUE -     Viral culture -     fluconazole (DIFLUCAN) 150 MG tablet; Take 1 tablet (150 mg total) by mouth once for 1 dose. Repeat in 48-72 hours if symptoms still present.  HSV-2 seropositive  Vaginal mucous membrane ulceration  Genital warts  Atypical squamous cell changes of undetermined significance (ASCUS) on cervical cytology with positive high risk human papilloma virus (HPV)  Adjustment reaction with anxiety and depression   Patient has appointment with GYN on February 17.  She is going to keep this.  Encourage patient to consider the HPV vaccine since it has now been FDA approved to 45.  She will continue to think about this.  Very reassuring that the wart is gone.  It is uncertain whether this discharge and ulceration is due to an outbreak of herpes or if it is a chemical irritation to the Aldara cream.  She only placed the cream on the 1 wart area but is unsure if it traveled throughout the labia.  It is very tender and painful.  There is a whitish discharge.  Patient is already on antiviral.  Unclear if there could be some yeast involvement.  I did do a stat wet prep today to check for this.  She is headed out of town.  I sent the Diflucan for her to pick up and we can call her if she is to take it or not.  Discussed HSV and HPV and what that means for the future.  Certainly this is a lot for her ongoing depression and anxiety.  Discussed counseling.  She declined today.  Certainly we can make some medication adjustments in the future if needed.  Gave a lot of reassurance follow-up as needed.  Marland Kitchen.Spent 30 minutes with patient and greater than 50 percent of visit spent counseling patient regarding treatment plan.

## 2018-06-06 ENCOUNTER — Encounter: Payer: Self-pay | Admitting: Obstetrics & Gynecology

## 2018-06-07 ENCOUNTER — Encounter: Payer: Self-pay | Admitting: Physician Assistant

## 2018-06-11 ENCOUNTER — Encounter: Payer: Self-pay | Admitting: Emergency Medicine

## 2018-06-11 ENCOUNTER — Emergency Department (INDEPENDENT_AMBULATORY_CARE_PROVIDER_SITE_OTHER)
Admission: EM | Admit: 2018-06-11 | Discharge: 2018-06-11 | Disposition: A | Discharge status: Home / Self Care | Payer: BLUE CROSS/BLUE SHIELD | Source: Home / Self Care

## 2018-06-11 DIAGNOSIS — R21 Rash and other nonspecific skin eruption: Secondary | ICD-10-CM | POA: Diagnosis not present

## 2018-06-11 MED ORDER — VALACYCLOVIR HCL 1 G PO TABS: 1000.0000 mg | ORAL_TABLET | Freq: Three times a day (TID) | ORAL | 0 refills | Status: DC

## 2018-06-11 MED ORDER — CEPHALEXIN 500 MG PO CAPS: 500.0000 mg | ORAL_CAPSULE | Freq: Two times a day (BID) | ORAL | 0 refills | Status: DC

## 2018-06-11 NOTE — ED Provider Notes (Signed)
[name]    CSN: [id number] Arrival date & time: 06/11/18  0954     History   Chief Complaint Chief Complaint  Patient presents with  . Rash    HPI Milianna Ericsson is a 33 y.o. female.   HPI  Elaynah Virginia is a 33 y.o. female presenting to UC with c/o itchy red rash around nose that has been there for 4 weeks. Denies new soaps, lotions or medications. She called her PCP who recommended she use Mupirocin, which she had from a prior ingrown hair. She used the medication 3 times daily for 5 days but believes it only made the redness and itching worse. She tried OTC cortisone cream as well as her mother's prescribed steroid cream for eczema. The steroids helped the itching but caused the rash to burn. Denies other rashes. No hx of fever blisters but notes she had STD testing last month after breaking up with her boyfriend. She did test positive for HSV 2 but states she has never had an outbreak, she did not know she had it until she was tested.  No hx of shingles. She did have chicken pox as a child. No recent illness of cough, congestion, fever or chills.    History reviewed. No pertinent past medical history.  Patient Active Problem List   Diagnosis Date Noted  . HSV-2 (herpes simplex virus 2) infection 05/30/2018  . Atypical squamous cell changes of undetermined significance (ASCUS) on cervical cytology with positive high risk human papilloma virus (HPV) 05/25/2018  . Genital warts 05/24/2018  . Inattention 02/18/2018  . Stress at work 02/18/2018  . Migraine 12/28/2017  . Vertigo 12/28/2017  . GAD (generalized anxiety disorder) 01/25/2017  . Adjustment reaction with anxiety and depression 12/15/2016  . Sleep disturbances 12/15/2016  . Family history of Alzheimer's disease 12/15/2016  . Hyperlipidemia 06/22/2014  . Dermatofibroma 06/19/2014  . Epidermal cyst 06/18/2014  . Obesity 06/18/2014  . Routine physical examination 06/18/2014    History reviewed. No  pertinent surgical history.  OB History   No obstetric history on file.      Home Medications    Prior to Admission medications   Medication Sig Start Date End Date Taking? Authorizing Provider  cephALEXin (KEFLEX) 500 MG capsule Take 1 capsule (500 mg total) by mouth 2 (two) times daily. 06/11/18   Noe Gens, PA-C  clonazePAM (KLONOPIN) 0.5 MG tablet TAKE 1 TABLET BY MOUTH TWICE A DAY 08/31/17   Breeback, Jade L, PA-C  imiquimod (ALDARA) 5 % cream Apply topically 3 (three) times a week. Until total clearance or max 16 weeks. 05/23/18   Breeback, Jade L, PA-C  sertraline (ZOLOFT) 100 MG tablet Take 1 tablet (100 mg total) by mouth daily. 05/24/18   Breeback, Royetta Car, PA-C  valACYclovir (VALTREX) 1000 MG tablet Take 1 tablet (1,000 mg total) by mouth 3 (three) times daily. 06/11/18   Noe Gens, PA-C    Family History Family History  Problem Relation Age of Onset  . Alzheimer's disease Mother     Social History Social History   Tobacco Use  . Smoking status: Never Smoker  . Smokeless tobacco: Never Used  Substance Use Topics  . Alcohol use: Yes    Alcohol/week: 0.0 standard drinks  . Drug use: No     Allergies   Sulfamethoxazole-trimethoprim; Amoxicillin-pot clavulanate; Codeine; and Sulfa antibiotics   Review of Systems Review of Systems  HENT: Negative for mouth sores, nosebleeds, rhinorrhea and trouble swallowing.  Skin: Positive for rash. Negative for wound.     Physical Exam Triage Vital Signs ED Triage Vitals  Enc Vitals Group     BP 06/11/18 1022 116/79     Pulse Rate 06/11/18 1022 68     Resp --      Temp 06/11/18 1022 98 F (36.7 C)     Temp Source 06/11/18 1022 Oral     SpO2 06/11/18 1022 95 %     Weight 06/11/18 1023 269 lb 4 oz (122.1 kg)     Height 06/11/18 1023 5\' 6"  (1.676 m)     Head Circumference --      Peak Flow --      Pain Score 06/11/18 1023 0     Pain Loc --      Pain Edu? --      Excl. in [place]? --    No data found.  Updated  Vital Signs BP 116/79 (BP Location: Right Arm)   Pulse 68   Temp 98 F (36.7 C) (Oral)   Ht 5\' 6"  (1.676 m)   Wt 269 lb 4 oz (122.1 kg)   LMP 06/10/2018   SpO2 95%   BMI 43.46 kg/m   Visual Acuity Right Eye Distance:   Left Eye Distance:   Bilateral Distance:    Right Eye Near:   Left Eye Near:    Bilateral Near:     Physical Exam Vitals signs and nursing note reviewed.  Constitutional:      Appearance: Normal appearance. She is well-developed.  HENT:     Head: Normocephalic and atraumatic.     Nose: No rhinorrhea.      Comments: Erythematous vesicular type rash under nose. 4mm honeycombed scab on Right side.  Neck:     Musculoskeletal: Normal range of motion.  Cardiovascular:     Rate and Rhythm: Normal rate.  Pulmonary:     Effort: Pulmonary effort is normal.  Musculoskeletal: Normal range of motion.  Skin:    General: Skin is warm and dry.  Neurological:     Mental Status: She is alert and oriented to person, place, and time.  Psychiatric:        Behavior: Behavior normal.      UC Treatments / Results  Labs (all labs ordered are listed, but only abnormal results are displayed) Labs Reviewed  HERPES SIMPLEX VIRUS CULTURE  WOUND CULTURE    EKG None  Radiology No results found.  Procedures Procedures (including critical care time)  Medications Ordered in UC Medications - No data to display  Initial Impression / Assessment and Plan / UC Course  I have reviewed the triage vital signs and the nursing notes.  Pertinent labs & imaging results that were available during my care of the patient were reviewed by me and considered in my medical decision making (see chart for details).    Rash questionable for herpes type rash vs bacterial infection with reaction to additives of creams she has been applying   Final Clinical Impressions(s) / UC Diagnoses   Final diagnoses:  Rash and nonspecific skin eruption   Discharge Instructions   None    ED  Prescriptions    Medication Sig Dispense Auth. Provider   valACYclovir (VALTREX) 1000 MG tablet Take 1 tablet (1,000 mg total) by mouth 3 (three) times daily. 21 tablet Gerarda Fraction, Chen Saadeh O, PA-C   cephALEXin (KEFLEX) 500 MG capsule Take 1 capsule (500 mg total) by mouth 2 (two) times daily. 14 capsule Leeroy Cha  O, PA-C     Controlled Substance Prescriptions [place] Controlled Substance Registry consulted? Not Applicable   Tyrell Antonio 06/11/18 1124

## 2018-06-11 NOTE — ED Triage Notes (Signed)
Patient c/o rash around her nose x 4 weeks, itching no pain, no new soaps.

## 2018-06-13 LAB — HERPES SIMPLEX VIRUS CULTURE
MICRO NUMBER:: 231340
SPECIMEN QUALITY:: ADEQUATE

## 2018-06-13 MED ORDER — CLOTRIMAZOLE-BETAMETHASONE 1-0.05 % EX CREA: 1.0000 "application " | TOPICAL_CREAM | Freq: Two times a day (BID) | CUTANEOUS | 0 refills | Status: DC

## 2018-06-14 ENCOUNTER — Telehealth: Payer: Self-pay | Admitting: Emergency Medicine

## 2018-06-14 LAB — WOUND CULTURE
MICRO NUMBER:: 231339
RESULT:: NO GROWTH
SPECIMEN QUALITY:: ADEQUATE

## 2018-06-14 NOTE — Telephone Encounter (Signed)
Message left on voice mail inquiring about patient's status and encouraging patient to call with questions/concerns; negative wound culture results.

## 2018-06-20 ENCOUNTER — Ambulatory Visit (INDEPENDENT_AMBULATORY_CARE_PROVIDER_SITE_OTHER): Payer: BLUE CROSS/BLUE SHIELD | Admitting: Physician Assistant

## 2018-06-20 ENCOUNTER — Encounter: Payer: Self-pay | Admitting: Physician Assistant

## 2018-06-20 VITALS — BP 127/95 | HR 77 | Temp 98.4°F | Wt 268.0 lb

## 2018-06-20 DIAGNOSIS — B079 Viral wart, unspecified: Secondary | ICD-10-CM | POA: Diagnosis not present

## 2018-06-20 DIAGNOSIS — Z23 Encounter for immunization: Secondary | ICD-10-CM

## 2018-06-20 NOTE — Patient Instructions (Signed)
HPV (Human Papillomavirus) Vaccine: What You Need to Know  1. Why get vaccinated?  HPV vaccine prevents infection with human papillomavirus (HPV) types that are associated with many cancers, including:  · cervical cancer in females,  · vaginal and vulvar cancers in females,  · anal cancer in females and males,  · throat cancer in females and males, and  · penile cancer in males.  In addition, HPV vaccine prevents infection with HPV types that cause genital warts in both females and males.  In the [place] about 12,000 women get cervical cancer every year, and about 4,000 women die from it. HPV vaccine can prevent most of these cases of cervical cancer.  Vaccination is not a substitute for cervical cancer screening. This vaccine does not protect against all HPV types that can cause cervical cancer. Women should still get regular Pap tests.  HPV infection usually comes from sexual contact, and most people will become infected at some point in their life. About 14 million Americans, including teens, get infected every year. Most infections will go away on their own and not cause serious problems. But thousands of women and men get cancer and other diseases from HPV.  2. HPV vaccine  HPV vaccine is approved by FDA and is recommended by CDC for both males and females. It is routinely given at 11 or 35 years of age, but it may be given beginning at age 9 years through age 26 years.  Most adolescents 9 through 35 years of age should get HPV vaccine as a two-dose series with the doses separated by 6-12 months. People who start HPV vaccination at 15 years of age and older should get the vaccine as a three-dose series with the second dose given 1-2 months after the first dose and the third dose given 6 months after the first dose. There are several exceptions to these age recommendations. Your health care provider can give you more information.  3. Some people should not get this vaccine  · Anyone who has had a severe  (life-threatening) allergic reaction to a dose of HPV vaccine should not get another dose.  · Anyone who has a severe (life threatening) allergy to any component of HPV vaccine should not get the vaccine.  ? Tell your doctor if you have any severe allergies that you know of, including a severe allergy to yeast.  · HPV vaccine is not recommended for pregnant women. If you learn that you were pregnant when you were vaccinated, there is no reason to expect any problems for you or your baby. Any woman who learns she was pregnant when she got HPV vaccine is encouraged to contact the manufacturer's registry for HPV vaccination during pregnancy at 1-800-986-8999. Women who are breastfeeding may be vaccinated.  · If you have a mild illness, such as a cold, you can probably get the vaccine today. If you are moderately or severely ill, you should probably wait until you recover. Your doctor can advise you.  4. Risks of a vaccine reaction  With any medicine, including vaccines, there is a chance of side effects. These are usually mild and go away on their own, but serious reactions are also possible.  Most people who get HPV vaccine do not have any serious problems with it.  Mild or moderate problems following HPV vaccine:  · Reactions in the arm where the shot was given:  ? Soreness (about 9 people in 10)  ? Redness or swelling (about 1 person in 3)  ·   3) Problems that could happen after any injected vaccine:  People sometimes faint after a medical procedure, including vaccination. Sitting or lying down for about 15 minutes can help prevent fainting, and injuries caused by a fall. Tell your doctor if you feel dizzy, or have vision changes or ringing in the ears.  Some people get severe pain in the shoulder and have difficulty moving the arm where a shot was given. This happens very  rarely.  Any medication can cause a severe allergic reaction. Such reactions from a vaccine are very rare, estimated at about 1 in a million doses, and would happen within a few minutes to a few hours after the vaccination. As with any medicine, there is a very remote chance of a vaccine causing a serious injury or death. The safety of vaccines is always being monitored. For more information, visit: http://www.aguilar.org/. 5. What if there is a serious reaction? What should I look for? Look for anything that concerns you, such as signs of a severe allergic reaction, very high fever, or unusual behavior. Signs of a severe allergic reaction can include hives, swelling of the face and throat, difficulty breathing, a fast heartbeat, dizziness, and weakness. These would usually start a few minutes to a few hours after the vaccination. What should I do? If you think it is a severe allergic reaction or other emergency that can't wait, call 9-1-1 or get to the nearest hospital. Otherwise, call your doctor. Afterward, the reaction should be reported to the Vaccine Adverse Event Reporting System (VAERS). Your doctor should file this report, or you can do it yourself through the VAERS web site at www.vaers.SamedayNews.es, or by calling 615 262 3379. VAERS does not give medical advice. 6. The National Vaccine Injury Compensation Program The [employer] Vaccine Injury Compensation Program (VICP) is a federal program that was created to compensate people who may have been injured by certain vaccines. Persons who believe they may have been injured by a vaccine can learn about the program and about filing a claim by calling 9016350811 or visiting the [place] website at GoldCloset.com.ee. There is a time limit to file a claim for compensation. 7. How can I learn more?  Ask your health care provider. He or she can give you the vaccine package insert or suggest other sources of information.  Call your  local or state health department.  Contact the Centers for Disease Control and Prevention (CDC): ? Call 331-103-4300 (1-800-CDC-INFO) or ? Visit CDC's website at http://sweeney-todd.com/ Vaccine Information Statement HPV Vaccine (03/22/2015) This information is not intended to replace advice given to you by your health care provider. Make sure you discuss any questions you have with your health care provider. Document Released: 11/01/2013 Document Revised: 11/16/2017 Document Reviewed: 11/16/2017 Elsevier Interactive Patient Education  2019 Else[employer] Inc. Genital Warts Genital warts are small growths in the area around the genitals or the anus. They are caused by a type of germ (HPV virus). This germ is spread from person to person during sex. It can be spread through vaginal, anal, and oral sex. Genital warts can lead to other problems if they are not treated. A person is more likely to have this condition if he or she:  Has sex without using a condom.  Has sex with many people.  Has sex before the age of 60.  Has a weak body defense (immune) system. This condition can be treated with medicines. Your doctor may also burn or freeze the warts. In some cases, surgery may be done  to remove the warts. Follow these instructions at home: Medicines   Apply over-the-counter and prescription medicines only as told by your doctor.  Do not use medicines that are meant for treating hand warts.  Talk with your doctor about using creams to treat itching. Instructions for women  Plan to have regular tests to check for cervical cancer. Your risk for this cancer increases when you have genital warts.  If you become pregnant, tell your doctor that you have had genital warts. The germ can be passed to the baby. General instructions  Do not touch or scratch the warts.  Do not have sex until your treatment is done.  Tell your current and past sexual partners about your condition. They may need  treatment.  After treatment, use condoms during sex.  Keep all follow-up visits as told by your doctor. This is important. How is this prevented? Talk with your doctor about getting the HPV shot. The HPV shot:  Can help stop some HPV infections and cancers.  Is given to males and females who are 32-1 years old.  Will not work if you already have HPV.  Is not recommended for pregnant women. Contact a doctor if:  You have redness, swelling, or pain in the area of the treated skin.  You have a fever.  You feel sick.  You feel lumps in the area around your genitals or anus.  You have bleeding in the area around your genitals or anus.  You have pain during sex. Summary  Genital warts are small growths in the areas around the genitals or the anus. They are caused by a type of germ (HPV virus).  The germ is spread by having vaginal, anal, or oral sex without using a condom.  This condition is treated using medicines. In some cases, freezing, burning, or surgery may be done to get rid of the warts.  This condition may be prevented by getting a HPV shot. This information is not intended to replace advice given to you by your health care provider. Make sure you discuss any questions you have with your health care provider. Document Released: 07/01/2009 Document Revised: 05/11/2017 Document Reviewed: 05/11/2017 Elsevier Interactive Patient Education  [employer].

## 2018-06-20 NOTE — Progress Notes (Signed)
Subjective:    Patient ID: Janet Clayton, female    DOB: 1985/07/29, 33 y.o.   MRN: [id number]  HPI  Patient is a 33 year old female who presents to the clinic to follow-up on her recent diagnosis of genital warts, herpes, HPV with abnormal cytology.  She has stopped using the Aldara and her vaginal irritation has resolved.  She does feel like most of her genital wart has resolved but she still feels 2 little spots.  She has not gone to GYN yet but has an appointment the 23rd of this month.  Overall she is feeling much better.  She denies any vaginal irritation, discharge, burning.  She is not currently sexually active. She comes in today to evaluate remaining wart and discuss HPV vaccine.   .. Active Ambulatory Problems    Diagnosis Date Noted  . Epidermal cyst 06/18/2014  . Obesity 06/18/2014  . Routine physical examination 06/18/2014  . Dermatofibroma 06/19/2014  . Hyperlipidemia 06/22/2014  . Adjustment reaction with anxiety and depression 12/15/2016  . Sleep disturbances 12/15/2016  . Family history of Alzheimer's disease 12/15/2016  . GAD (generalized anxiety disorder) 01/25/2017  . Migraine 12/28/2017  . Vertigo 12/28/2017  . Inattention 02/18/2018  . Stress at work 02/18/2018  . Genital warts 05/24/2018  . Atypical squamous cell changes of undetermined significance (ASCUS) on cervical cytology with positive high risk human papilloma virus (HPV) 05/25/2018  . HSV-2 (herpes simplex virus 2) infection 05/30/2018   Resolved Ambulatory Problems    Diagnosis Date Noted  . No Resolved Ambulatory Problems   No Additional Past Medical History      Review of Systems See HPI>     Objective:   Physical Exam Vitals signs reviewed.  Constitutional:      Appearance: Normal appearance.  HENT:     Head: Normocephalic.  Cardiovascular:     Rate and Rhythm: Normal rate.     Pulses: Normal pulses.  Pulmonary:     Effort: Pulmonary effort is normal.   Genitourinary:   Neurological:     Mental Status: She is alert.           Assessment & Plan:  Marland KitchenMarland KitchenJolin was seen today for follow-up.  Diagnoses and all orders for this visit:  Viral warts due to HPV  Need for HPV vaccination -     Cancel: HPV 9-valent vaccine,Recombinat   Encouraged patient to start the HPV series. She declined today. She will discuss with GYN.   Use cryo to see if we could get rid of remaining wart. She will follow back up with GYN.   Cryotherapy Procedure Note  Pre-operative Diagnosis: genital wart   Post-operative Diagnosis: genital wart  Locations: 6 and 8 oclock introitus.   Indications: irritation/pre cancerous  Procedure Details  History of allergy to iodine: no. Pacemaker? no.  Patient informed of risks (permanent scarring, infection, light or dark discoloration, bleeding, infection, weakness, numbness and recurrence of the lesion) and benefits of the procedure and verbal informed consent obtained.  The areas are treated with liquid nitrogen therapy, frozen until ice ball extended 2 mm beyond lesion, allowed to thaw, and treated again. The patient tolerated procedure well.  The patient was instructed on post-op care, warned that there may be blister formation, redness and pain. Recommend OTC analgesia as needed for pain.  Condition: Stable  Complications: none.  Plan: 1. Instructed to keep the area dry and covered for 24-48h and clean thereafter. 2. Warning signs of infection were reviewed.   3.  Recommended that the patient use OTC ibuprofen as needed for pain.

## 2018-06-21 ENCOUNTER — Encounter: Payer: Self-pay | Admitting: Physician Assistant

## 2018-06-24 LAB — VIRAL CULTURE VIRC
MICRO NUMBER: 186774
SPECIMEN QUALITY: ADEQUATE

## 2018-06-24 LAB — WET PREP FOR TRICH, YEAST, CLUE
MICRO NUMBER: 185602
Specimen Quality: ADEQUATE

## 2018-07-11 ENCOUNTER — Encounter: Payer: BLUE CROSS/BLUE SHIELD | Admitting: Obstetrics & Gynecology

## 2018-07-11 ENCOUNTER — Other Ambulatory Visit: Payer: Self-pay | Admitting: Physician Assistant

## 2018-07-11 DIAGNOSIS — A630 Anogenital (venereal) warts: Secondary | ICD-10-CM

## 2018-07-19 ENCOUNTER — Encounter: Payer: Self-pay | Admitting: Physician Assistant

## 2018-07-19 DIAGNOSIS — F4323 Adjustment disorder with mixed anxiety and depressed mood: Secondary | ICD-10-CM

## 2018-07-19 MED ORDER — CLONAZEPAM 0.5 MG PO TABS: 0.5000 mg | ORAL_TABLET | Freq: Two times a day (BID) | ORAL | 0 refills | Status: DC

## 2018-07-25 ENCOUNTER — Encounter: Payer: Self-pay | Admitting: Physician Assistant

## 2018-07-25 ENCOUNTER — Encounter: Payer: BLUE CROSS/BLUE SHIELD | Admitting: Obstetrics & Gynecology

## 2018-10-17 ENCOUNTER — Other Ambulatory Visit: Payer: Self-pay

## 2018-10-17 ENCOUNTER — Ambulatory Visit: Payer: No Typology Code available for payment source | Admitting: Obstetrics & Gynecology

## 2018-10-17 ENCOUNTER — Encounter: Payer: Self-pay | Admitting: Obstetrics & Gynecology

## 2018-10-17 ENCOUNTER — Encounter: Payer: Self-pay | Admitting: Physician Assistant

## 2018-10-17 VITALS — BP 142/96 | HR 102 | Resp 16 | Ht 66.0 in | Wt 268.0 lb

## 2018-10-17 DIAGNOSIS — N870 Mild cervical dysplasia: Secondary | ICD-10-CM | POA: Diagnosis not present

## 2018-10-17 DIAGNOSIS — Z3202 Encounter for pregnancy test, result negative: Secondary | ICD-10-CM

## 2018-10-17 DIAGNOSIS — R87810 Cervical high risk human papillomavirus (HPV) DNA test positive: Secondary | ICD-10-CM | POA: Diagnosis not present

## 2018-10-17 DIAGNOSIS — Z113 Encounter for screening for infections with a predominantly sexual mode of transmission: Secondary | ICD-10-CM

## 2018-10-17 DIAGNOSIS — R87610 Atypical squamous cells of undetermined significance on cytologic smear of cervix (ASC-US): Secondary | ICD-10-CM

## 2018-10-17 DIAGNOSIS — N898 Other specified noninflammatory disorders of vagina: Secondary | ICD-10-CM | POA: Diagnosis not present

## 2018-10-17 LAB — POCT URINE PREGNANCY: Preg Test, Ur: NEGATIVE

## 2018-10-17 NOTE — Progress Notes (Signed)
Subjective:    Patient ID: Janet Clayton, female    DOB: May 10, 1985, 33 y.o.   MRN: [id number]  HPI  33 year old female presents for colposcopy for ASCUS Pap with positive HPV.  This is her first abnormal Pap smear.  She also has some other issues to be addressed.  Patient has HSV titer that is positive.  The patient has done some reading about serology for HSV diagnosis.  It can be very inconclusive and false positives as well as false negatives.  Best test is viral culture at time of lesion.  Patient will present if she ever has an outbreak.  She does not remember having an outbreak in the past.  Patient has a long history of dyspareunia.  It began 8 years ago with her first partner.  She is unable to use tampons.  Speculum exams are also difficult for her.  Felt this was mostly normal and has not had an evaluation.   Review of Systems  Constitutional: Negative.   Respiratory: Negative.   Cardiovascular: Negative.   Gastrointestinal: Negative.   Genitourinary: Positive for dyspareunia, pelvic pain and vaginal pain. Negative for vaginal bleeding.  Psychiatric/Behavioral: Negative.        Objective:   Physical Exam Vitals signs reviewed. Exam conducted with a chaperone present.  Constitutional:      General: She is not in acute distress.    Appearance: She is well-developed.  HENT:     Head: Normocephalic and atraumatic.  Eyes:     Conjunctiva/sclera: Conjunctivae normal.  Cardiovascular:     Rate and Rhythm: Normal rate.  Pulmonary:     Effort: Pulmonary effort is normal.  Abdominal:     General: Bowel sounds are normal. There is no distension.     Palpations: Abdomen is soft. There is no mass.     Tenderness: There is no abdominal tenderness. There is no guarding or rebound.  Genitourinary:    Exam position: Lithotomy position.     Labia:        Right: Tenderness present.        Left: Tenderness present.      Urethra: No urethral pain or urethral lesion.         Comments: cervix is low in vagina (explains difficulty with tampons) Skin:    General: Skin is warm and dry.  Neurological:     Mental Status: She is alert and oriented to person, place, and time.       Assessment & Plan:  33 yo female with   1.  ASCUS and +HRHPV--coplo (see note) 2.  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  HSV serology--discussed false positive  30 minutes spent with patient with >50% counseling  Colposcopy Procedure Note  Indications: Pap smear 4 months ago showed: ASCUS with POSITIVE high risk HPV. The prior pap showed no abnormalities.  Prior cervical/vaginal disease: normal exam without visible pathology. Prior cervical treatment: no treatment.  Procedure Details  The risks and benefits of the procedure and Written informed consent obtained.  Speculum placed in vagina and excellent visualization of cervix achieved, cervix swabbed x 3 with acetic acid solution.  Findings: Cervix: and acetowhite lesion(s) noted at 12 o'clock; cervix swabbed with Lugol's solution, SCJ visualized - lesion at 12 o'clock, cervical biopsies taken at 12 o'clock, specimen labelled and sent to pathology and hemostasis achieved with Monsel's solution. Vaginal inspection: vaginal colposcopy not performed. Vulvar colposcopy: vulvar colposcopy not performed.  Specimens: ECC and 12 o'clock biopsy  Complications: none.  Plan: Specimens labelled and sent to Pathology. Will base further treatment on Pathology findings. Post biopsy instructions given to patient.

## 2018-10-18 ENCOUNTER — Encounter: Payer: Self-pay | Admitting: *Deleted

## 2018-10-18 LAB — CERVICOVAGINAL ANCILLARY ONLY
Bacterial vaginitis: NEGATIVE
Candida vaginitis: NEGATIVE
Chlamydia: NEGATIVE
Neisseria Gonorrhea: NEGATIVE

## 2018-10-24 ENCOUNTER — Encounter: Payer: Self-pay | Admitting: Obstetrics & Gynecology

## 2018-10-26 ENCOUNTER — Telehealth: Payer: Self-pay | Admitting: *Deleted

## 2018-10-26 ENCOUNTER — Encounter: Payer: Self-pay | Admitting: *Deleted

## 2018-10-26 NOTE — Telephone Encounter (Signed)
My chart message sent to patient to make sure that she does get a repeat pap in 1 year.

## 2018-10-26 NOTE — Telephone Encounter (Signed)
-----   Message from Guss Bunde, MD sent at 10/24/2018  9:12 AM EDT ----- CIN 1 on biopsy. Needs pap with co-testing in 1 year.

## 2018-10-31 ENCOUNTER — Ambulatory Visit: Payer: No Typology Code available for payment source | Admitting: Obstetrics & Gynecology

## 2018-11-14 ENCOUNTER — Encounter: Payer: Self-pay | Admitting: Obstetrics & Gynecology

## 2018-11-14 ENCOUNTER — Ambulatory Visit: Payer: No Typology Code available for payment source | Admitting: Obstetrics & Gynecology

## 2018-11-14 ENCOUNTER — Other Ambulatory Visit: Payer: Self-pay

## 2018-11-14 VITALS — BP 156/102 | HR 81 | Resp 16 | Ht 66.0 in | Wt 266.0 lb

## 2018-11-14 DIAGNOSIS — N94819 Vulvodynia, unspecified: Secondary | ICD-10-CM | POA: Insufficient documentation

## 2018-11-14 NOTE — Progress Notes (Signed)
   Subjective:    Patient ID: Janet Clayton, female    DOB: 1986/01/30, 33 y.o.   MRN: [id number]  HPI  Pt c/o dyspareunia and painful tampon insertion.  Pt noted to have pain at introitus during colpo.  Pt has not had treatment for painful intercourse in past an thought it was normal.  Patient denies discharge or irregular bleeding.  Review of Systems  Constitutional: Negative.   Respiratory: Negative.   Cardiovascular: Negative.   Gastrointestinal: Negative.   Genitourinary: Positive for pelvic pain and vaginal pain. Negative for vaginal bleeding and vaginal discharge.  Psychiatric/Behavioral: Negative.        Objective:   Physical Exam Vitals signs reviewed.  Constitutional:      General: She is not in acute distress.    Appearance: She is well-developed.  HENT:     Head: Normocephalic and atraumatic.  Eyes:     Conjunctiva/sclera: Conjunctivae normal.  Cardiovascular:     Rate and Rhythm: Normal rate.  Pulmonary:     Effort: Pulmonary effort is normal.  Abdominal:     Palpations: Abdomen is soft.     Tenderness: There is no abdominal tenderness.  Genitourinary:   Skin:    General: Skin is warm and dry.  Neurological:     Mental Status: She is alert and oriented to person, place, and time.  Psychiatric:        Mood and Affect: Mood normal.    Vitals:   11/14/18 1358  BP: (!) 156/102  Pulse: 81  Resp: 16  Weight: 266 lb (120.7 kg)  Height: 5\' 6"  (1.676 m)   Assessment & Plan:  1.  Referral to pelvic physical therapy for evaluation of vulvodynia.  If after treatment course that she is not significantly better, we can discuss medications. 2.  Patient happy with her bleeding profile at this point.  She is not interested in starting birth control pills to help her menses.  She is not sexually active. 3.  Has hypertension again and will need to follow-up with her primary care provider.  25 minutes spent face-to-face with patient with greater than 50%  counseling.

## 2018-11-15 ENCOUNTER — Telehealth: Payer: Self-pay | Admitting: Neurology

## 2018-11-15 NOTE — Telephone Encounter (Signed)
Left message on machine for patient to call back.  Needs appt for BP check.

## 2018-11-15 NOTE — Telephone Encounter (Signed)
Editor: Lavada Mesi (Physician Assistant)      Saw last BP at GYN. We need to have her come in to get rechecked.

## 2018-11-15 NOTE — Telephone Encounter (Signed)
Patient scheduled.

## 2018-11-15 NOTE — Telephone Encounter (Signed)
-----   Message from Donella Stade, [place] sent at 11/15/2018  7:20 AM EDT -----   ----- Message ----- From: Guss Bunde, MD Sent: 11/14/2018   4:42 PM EDT To: Donella Stade, PA-C

## 2018-11-15 NOTE — Progress Notes (Signed)
Saw last BP at GYN. We need to have her come in to get rechecked.

## 2018-11-16 ENCOUNTER — Other Ambulatory Visit: Payer: Self-pay

## 2018-11-16 ENCOUNTER — Ambulatory Visit (INDEPENDENT_AMBULATORY_CARE_PROVIDER_SITE_OTHER): Payer: No Typology Code available for payment source | Admitting: Physician Assistant

## 2018-11-16 ENCOUNTER — Encounter: Payer: Self-pay | Admitting: Physician Assistant

## 2018-11-16 VITALS — BP 126/75 | HR 70 | Temp 98.7°F | Ht 66.0 in | Wt 266.0 lb

## 2018-11-16 DIAGNOSIS — Z13220 Encounter for screening for lipoid disorders: Secondary | ICD-10-CM

## 2018-11-16 DIAGNOSIS — Z131 Encounter for screening for diabetes mellitus: Secondary | ICD-10-CM

## 2018-11-16 DIAGNOSIS — R030 Elevated blood-pressure reading, without diagnosis of hypertension: Secondary | ICD-10-CM | POA: Diagnosis not present

## 2018-11-16 DIAGNOSIS — Z0000 Encounter for general adult medical examination without abnormal findings: Secondary | ICD-10-CM

## 2018-11-16 MED ORDER — SAXENDA 18 MG/3ML ~~LOC~~ SOPN: 0.6000 mg | PEN_INJECTOR | Freq: Every day | SUBCUTANEOUS | 1 refills | Status: DC

## 2018-11-16 MED ORDER — NOVOFINE 32G X 6 MM MISC: 0 refills | Status: DC

## 2018-11-16 NOTE — Patient Instructions (Signed)
Bupropion; Naltrexone extended-release tablets What is this medicine? BUPROPION; NALTREXONE (byoo PROE pee on; nal TREX one) is a combination product used to promote and maintain weight loss in obese adults or overweight adults who also have weight related medical problems. This medicine should be used with a reduced calorie diet and increased physical activity. This medicine may be used for other purposes; ask your health care provider or pharmacist if you have questions. COMMON BRAND NAME(S): Contrave What should I tell my health care provider before I take this medicine? They need to know if you have any of these conditions:  an eating disorder, such as anorexia or bulimia  bipolar disorder  diabetes  depression  drug abuse or addiction  glaucoma  head injury  heart disease  high blood pressure  history of a tumor or infection of your brain or spine  history of stroke  history of irregular heartbeat  if you often drink alcohol  kidney disease  liver disease  schizophrenia  seizures  suicidal thoughts, plans, or attempt; a previous suicide attempt by you or a family member  an unusual or allergic reaction to bupropion, naltrexone, other medicines, foods, dyes, or preservatives  breast-feeding  pregnant or trying to become pregnant How should I use this medicine? Take this medicine by mouth with a glass of water. Follow the directions on the prescription label. Take this medicine in the morning and in the evenings as directed by your healthcare professional. Dennis Bast can take it with or without food. Do not take with high-fat meals as this may increase your risk of seizures. Do not crush, chew, or cut these tablets. Do not take your medicine more often than directed. Do not stop taking this medicine suddenly except upon the advice of your doctor. A special MedGuide will be given to you by the pharmacist with each prescription and refill. Be sure to read this  information carefully each time. Talk to your pediatrician regarding the use of this medicine in children. Special care may be needed. Overdosage: If you think you have taken too much of this medicine contact a poison control center or emergency room at once. NOTE: This medicine is only for you. Do not share this medicine with others. What if I miss a dose? If you miss a dose, skip the missed dose and take your next tablet at the regular time. Do not take double or extra doses. What may interact with this medicine? Do not take this medicine with any of the following medications:  any prescription or street opioid drug like codeine, heroin, methadone  linezolid  MAOIs like Carbex, Eldepryl, Marplan, Nardil, and Parnate  methylene blue (injected into a vein)  other medicines that contain bupropion like Zyban or Wellbutrin This medicine may also interact with the following medications:  alcohol  certain medicines for anxiety or sleep  certain medicines for blood pressure like metoprolol, propranolol  certain medicines for depression or psychotic disturbances  certain medicines for HIV or AIDS like efavirenz, lopinavir, nelfinavir, ritonavir  certain medicines for irregular heart beat like propafenone, flecainide  certain medicines for Parkinson's disease like amantadine, levodopa  certain medicines for seizures like carbamazepine, phenytoin, phenobarbital  cimetidine  clopidogrel  cyclophosphamide  digoxin  disulfiram  furazolidone  isoniazid  nicotine  orphenadrine  procarbazine  steroid medicines like prednisone or cortisone  stimulant medicines for attention disorders, weight loss, or to stay awake  tamoxifen  theophylline  thioridazine  thiotepa  ticlopidine  tramadol  warfarin This list  may not describe all possible interactions. Give your health care provider a list of all the medicines, herbs, non-prescription drugs, or dietary supplements  you use. Also tell them if you smoke, drink alcohol, or use illegal drugs. Some items may interact with your medicine. What should I watch for while using this medicine? This medicine is intended to be used in addition to a healthy diet and appropriate exercise. The best results are achieved this way. Do not increase or in any way change your dose without consulting your doctor or healthcare provider. Do not take this medicine with other prescription or over-the-counter weight loss products without consulting your doctor or healthcare provider. Your doctor should tell you to stop taking this medicine if you do not lose a certain amount of weight within the first 12 weeks of treatment. Visit your doctor or healthcare provider for regular checkups. Your doctor may order blood tests or other tests to see how you are doing. This medicine may cause serious skin reactions. They can happen weeks to months after starting the medicine. Contact your healthcare provider right away if you notice fevers or flu-like symptoms with a rash. The rash may be red or purple and then turn into blisters or peeling of the skin. Or, you might notice a red rash with swelling of the face, lips or lymph nodes in your neck or under your arms. This medicine may affect blood sugar levels. If you have diabetes, check with your doctor or health care professional before you change your diet or the dose of your diabetic medicine. Patients and their families should watch out for new or worsening depression or thoughts of suicide. Also watch out for sudden changes in feelings such as feeling anxious, agitated, panicky, irritable, hostile, aggressive, impulsive, severely restless, overly excited and hyperactive, or not being able to sleep. If this happens, especially at the beginning of treatment or after a change in dose, call your healthcare provider. Avoid alcoholic drinks while taking this medicine. Drinking large amounts of alcoholic  beverages, using sleeping or anxiety medicines, or quickly stopping the use of these agents while taking this medicine may increase your risk for a seizure. What side effects may I notice from receiving this medicine? Side effects that you should report to your doctor or health care professional as soon as possible:  allergic reactions like skin rash, itching or hives, swelling of the face, lips, or tongue  breathing problems  changes in vision  confusion  elevated mood, decreased need for sleep, racing thoughts, impulsive behavior  fast or irregular heartbeat  hallucinations, loss of contact with reality  increased blood pressure  rash, fever, and swollen lymph nodes  redness, blistering, peeling, or loosening of the skin, including inside the mouth  seizures  signs and symptoms of liver injury like dark yellow or brown urine; general ill feeling or flu-like symptoms; light-colored stools; loss of appetite; nausea; right upper belly pain; unusually weak or tired; yellowing of the eyes or skin  suicidal thoughts or other mood changes  vomiting Side effects that usually do not require medical attention (report to your doctor or health care professional if they continue or are bothersome):  constipation  headache  loss of appetite  indigestion, stomach upset  tremors This list may not describe all possible side effects. Call your doctor for medical advice about side effects. You may report side effects to FDA at 1-800-FDA-1088. Where should I keep my medicine? Keep out of the reach of children. Store at  room temperature between 15 and 30 degrees C (59 and 86 degrees F). Throw away any unused medicine after the expiration date. NOTE: This sheet is a summary. It may not cover all possible information. If you have questions about this medicine, talk to your doctor, pharmacist, or health care provider.  2020 Elsevier/Gold Standard (2018-06-30 14:09:01) Liraglutide injection  (Weight Management) What is this medicine? LIRAGLUTIDE (LIR a GLOO tide) is used to help people lose weight and maintain weight loss. It is used with a reduced-calorie diet and exercise. This medicine may be used for other purposes; ask your health care provider or pharmacist if you have questions. COMMON BRAND NAME(S): Saxenda What should I tell my health care provider before I take this medicine? They need to know if you have any of these conditions:  endocrine tumors (MEN 2) or if someone in your family had these tumors  gallbladder disease  high cholesterol  history of alcohol abuse problem  history of pancreatitis  kidney disease or if you are on dialysis  liver disease  previous swelling of the tongue, face, or lips with difficulty breathing, difficulty swallowing, hoarseness, or tightening of the throat  stomach problems  suicidal thoughts, plans, or attempt; a previous suicide attempt by you or a family member  thyroid cancer or if someone in your family had thyroid cancer  an unusual or allergic reaction to liraglutide, other medicines, foods, dyes, or preservatives  pregnant or trying to get pregnant  breast-feeding How should I use this medicine? This medicine is for injection under the skin of your upper leg, stomach area, or upper arm. You will be taught how to prepare and give this medicine. Use exactly as directed. Take your medicine at regular intervals. Do not take it more often than directed. It is important that you put your used needles and syringes in a special sharps container. Do not put them in a trash can. If you do not have a sharps container, call your pharmacist or healthcare provider to get one. A special MedGuide will be given to you by the pharmacist with each prescription and refill. Be sure to read this information carefully each time. Talk to your pediatrician regarding the use of this medicine in children. Special care may be  needed. Overdosage: If you think you have taken too much of this medicine contact a poison control center or emergency room at once. NOTE: This medicine is only for you. Do not share this medicine with others. What if I miss a dose? If you miss a dose, take it as soon as you can. If it is almost time for your next dose, take only that dose. Do not take double or extra doses. If you miss your dose for 3 days or more, call your doctor or health care professional to talk about how to restart this medicine. What may interact with this medicine?  insulin and other medicines for diabetes This list may not describe all possible interactions. Give your health care provider a list of all the medicines, herbs, non-prescription drugs, or dietary supplements you use. Also tell them if you smoke, drink alcohol, or use illegal drugs. Some items may interact with your medicine. What should I watch for while using this medicine? Visit your doctor or health care professional for regular checks on your progress. This medicine is intended to be used in addition to a healthy diet and appropriate exercise. The best results are achieved this way. Do not increase or in any way  change your dose without consulting your doctor or health care professional. Drink plenty of fluids while taking this medicine. Check with your doctor or health care professional if you get an attack of severe diarrhea, nausea, and vomiting. The loss of too much body fluid can make it dangerous for you to take this medicine. This medicine may affect blood sugar levels. If you have diabetes, check with your doctor or health care professional before you change your diet or the dose of your diabetic medicine. Patients and their families should watch out for worsening depression or thoughts of suicide. Also watch out for sudden changes in feelings such as feeling anxious, agitated, panicky, irritable, hostile, aggressive, impulsive, severely restless,  overly excited and hyperactive, or not being able to sleep. If this happens, especially at the beginning of treatment or after a change in dose, call your health care professional. What side effects may I notice from receiving this medicine? Side effects that you should report to your doctor or health care professional as soon as possible:  allergic reactions like skin rash, itching or hives, swelling of the face, lips, or tongue  breathing problems  diarrhea that continues or is severe  lump or swelling on the neck  severe nausea  signs and symptoms of infection like fever or chills; cough; sore throat; pain or trouble passing urine  signs and symptoms of low blood sugar such as feeling anxious, confusion, dizziness, increased hunger, unusually weak or tired, sweating, shakiness, cold, irritable, headache, blurred vision, fast heartbeat, loss of consciousness  signs and symptoms of kidney injury like trouble passing urine or change in the amount of urine  trouble swallowing  unusual stomach upset or pain  vomiting Side effects that usually do not require medical attention (report to your doctor or health care professional if they continue or are bothersome):  constipation  decreased appetite  diarrhea  fatigue  headache  nausea  pain, redness, or irritation at site where injected  stomach upset  stuffy or runny nose This list may not describe all possible side effects. Call your doctor for medical advice about side effects. You may report side effects to FDA at 1-800-FDA-1088. Where should I keep my medicine? Keep out of the reach of children. Store unopened pen in a refrigerator between 2 and 8 degrees C (36 and 46 degrees F). Do not freeze or use if the medicine has been frozen. Protect from light and excessive heat. After you first use the pen, it can be stored at room temperature between 15 and 30 degrees C (59 and 86 degrees F) or in a refrigerator. Throw away  your used pen after 30 days or after the expiration date, whichever comes first. Do not store your pen with the needle attached. If the needle is left on, medicine may leak from the pen. NOTE: This sheet is a summary. It may not cover all possible information. If you have questions about this medicine, talk to your doctor, pharmacist, or health care provider.  2020 Elsevier/Gold Standard (2018-05-13 17:10:35)

## 2018-11-16 NOTE — Progress Notes (Signed)
Subjective:    Patient ID: Janet Clayton, female    DOB: 1986-04-07, 33 y.o.   MRN: [id number]  HPI Pt is a 33 yo obese female who presents to the clinic to follow up on elevated blood pressure at GYN office for the last 2 visits. She denies any CP, palpitations, headaches. She has never had problems with blood pressure. She does not check at home. She does admit to getting VERY nervous any time she has to have a pelvic exam.   She is unhappy with her weight. She is trying to "cut back" but has no plan. Never tried medications.   .. Active Ambulatory Problems    Diagnosis Date Noted  . Epidermal cyst 06/18/2014  . Obesity 06/18/2014  . Routine physical examination 06/18/2014  . Dermatofibroma 06/19/2014  . Hyperlipidemia 06/22/2014  . Adjustment reaction with anxiety and depression 12/15/2016  . Sleep disturbances 12/15/2016  . Family history of Alzheimer's disease 12/15/2016  . GAD (generalized anxiety disorder) 01/25/2017  . Migraine 12/28/2017  . Vertigo 12/28/2017  . Inattention 02/18/2018  . Stress at work 02/18/2018  . Genital warts 05/24/2018  . Dysplasia of cervix, low grade (CIN 1) 05/25/2018  . HSV-2 (herpes simplex virus 2) infection 05/30/2018  . 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 Elevated blood pressure reading 11/16/2018  . Morbidly obese ([place]) 11/16/2018   Resolved Ambulatory Problems    Diagnosis Date Noted  . No Resolved Ambulatory Problems   Past Medical History:  Diagnosis Date  . Vaginal Pap smear, abnormal       Review of Systems  All other systems reviewed and are negative.      Objective:   Physical Exam Vitals signs reviewed.  Constitutional:      Appearance: Normal appearance. She is obese.  Cardiovascular:     Rate and Rhythm: Normal rate and regular rhythm.     Heart sounds: No murmur.  Pulmonary:     Effort: Pulmonary effort is normal.     Breath sounds: Normal breath sounds.  Neurological:     General: No focal deficit present.   Mental Status: She is alert and oriented to person, place, and time.  Psychiatric:        Mood and Affect: Mood normal.           Assessment & Plan:  Marland Kitchen[place]land Kitchen[place] was seen today for hypertension.  Diagnoses and all orders for this visit:  Elevated blood pressure reading  Screening for lipid disorders -     Lipid Panel w/reflex Direct LDL  Screening for diabetes mellitus -     COMPLETE METABOLIC PANEL WITH GFR  Morbidly obese ([facility]) -     TSH -     Liraglutide -Weight Management (SAXENDA) 18 MG/3ML SOPN; Inject 0.6 mg into the skin daily. For one week then increase by .6mg  weekly until reaches 3mg  daily.  Please include ultra fine needles 55mm -     Insulin Pen Needle (NOVOFINE) 32G X 6 MM MISC; Use one needle daily with saxenda pen.  Preventative health care -     Lipid Panel w/reflex Direct LDL -     COMPLETE METABOLIC PANEL WITH GFR -     TSH   BP looks great. I do think her anxiety is the factor at GYN. Continue to monitor at home. She has a brother who is a EMT.   Needs fasting labs.   Marland Kitchen.Discussed low carb diet with 1500 calories and 80g of protein.  Exercising at least 150 minutes a  week.  [employer] could be a Microbiologist.  Consider IF 16:8.  Discussed medications.  Not a candidate for phentermine due to anxiety.  Start saxenda. Discussed side effects and how to titrate up.  Follow up in 2 months.   Marland Kitchen.Spent 30 minutes with patient and greater than 50 percent of visit spent counseling patient regarding treatment plan.

## 2018-11-17 LAB — LIPID PANEL W/REFLEX DIRECT LDL
Cholesterol: 229 mg/dL — ABNORMAL HIGH (ref <200)
HDL: 49 mg/dL — ABNORMAL LOW (ref >=50)
LDL Cholesterol (Calc): 153 mg/dL (calc) — ABNORMAL HIGH
Non-HDL Cholesterol (Calc): 180 mg/dL (calc) — ABNORMAL HIGH (ref <130)
Total CHOL/HDL Ratio: 4.7 (calc) (ref <5.0)
Triglycerides: 138 mg/dL (ref <150)

## 2018-11-17 LAB — COMPLETE METABOLIC PANEL WITH GFR
AG Ratio: 1.8 (calc) (ref 1.0–2.5)
ALT: 13 U/L (ref 6–29)
AST: 17 U/L (ref 10–30)
Albumin: 3.9 g/dL (ref 3.6–5.1)
Alkaline phosphatase (APISO): 68 U/L (ref 31–125)
BUN: 11 mg/dL (ref 7–25)
CO2: 27 mmol/L (ref 20–32)
Calcium: 8.9 mg/dL (ref 8.6–10.2)
Chloride: 106 mmol/L (ref 98–110)
Creat: 0.67 mg/dL (ref 0.50–1.10)
GFR, Est African American: 133 mL/min/{1.73_m2} (ref >=60)
GFR, Est Non African American: 115 mL/min/{1.73_m2} (ref >=60)
Globulin: 2.2 g/dL (calc) (ref 1.9–3.7)
Glucose, Bld: 101 mg/dL — ABNORMAL HIGH (ref 65–99)
Potassium: 4.4 mmol/L (ref 3.5–5.3)
Sodium: 139 mmol/L (ref 135–146)
Total Bilirubin: 0.6 mg/dL (ref 0.2–1.2)
Total Protein: 6.1 g/dL (ref 6.1–8.1)

## 2018-11-17 LAB — TSH: TSH: 1.23 mIU/L

## 2018-11-17 NOTE — Progress Notes (Signed)
Call pt: fasting sugar just a hair elevated. Next time you come in will do a1c.  Cholesterol is elevated. Lets work on diet/exercise and check in 6 months. I really think the IF might be able to bring this down.  Thyroid looks great.

## 2018-11-19 ENCOUNTER — Encounter: Payer: Self-pay | Admitting: Physician Assistant

## 2018-11-20 ENCOUNTER — Other Ambulatory Visit: Payer: Self-pay | Admitting: Osteopathic Medicine

## 2018-11-23 ENCOUNTER — Other Ambulatory Visit: Payer: Self-pay

## 2018-11-23 ENCOUNTER — Ambulatory Visit: Payer: No Typology Code available for payment source | Attending: Obstetrics & Gynecology | Admitting: Physical Therapy

## 2018-11-23 ENCOUNTER — Encounter: Payer: Self-pay | Admitting: Physical Therapy

## 2018-11-23 DIAGNOSIS — M6281 Muscle weakness (generalized): Secondary | ICD-10-CM | POA: Insufficient documentation

## 2018-11-23 DIAGNOSIS — N94819 Vulvodynia, unspecified: Secondary | ICD-10-CM | POA: Diagnosis present

## 2018-11-23 DIAGNOSIS — R252 Cramp and spasm: Secondary | ICD-10-CM | POA: Diagnosis present

## 2018-11-23 DIAGNOSIS — R278 Other lack of coordination: Secondary | ICD-10-CM | POA: Diagnosis present

## 2018-11-23 NOTE — Therapy (Signed)
[facility] [address], [place], [place], [address] Phone: [phone]   Fax:  [phone]  Physical Therapy Evaluation  Patient Details  Name: Janet Clayton MRN: [id number] Date of Birth: 1986-04-05 Referring Provider (PT): Dr. Silas [place]   Encounter Date: 11/23/2018  PT End of Session - 11/23/18 1246    Visit Number  1    Date for PT Re-Evaluation  02/15/19    Authorization Type  UHC    Authorization - Visit Number  1    Authorization - Number of Visits  30    PT Start Time  1200    PT Stop Time  1240    PT Time Calculation (min)  40 min    Activity Tolerance  Patient tolerated treatment well;No increased pain    Behavior During Therapy  WFL for tasks assessed/performed       Past Medical History:  Diagnosis Date  . Vaginal Pap smear, abnormal     History reviewed. No pertinent surgical history.  There were no vitals filed for this visit.   Subjective Assessment - 11/23/18 1204    Subjective  Patient has had pain since she started sex, tampon, and vaginal exam. Tampon feel like it will not go in. Patient is aware that the tampon is there. Pain is more internal than external. Unable to get to climax. Pain with initial penetration.    Patient Stated Goals  reduce pain with vaginal exam and with penile penetration    Currently in Pain?  Yes    Pain Score  8     Pain Location  Vagina    Pain Orientation  Mid    Pain Descriptors / Indicators  Grimacing   feels like she will rip   Pain Type  Chronic pain    Pain Onset  More than a month ago    Pain Frequency  Intermittent    Aggravating Factors   penile penetration; tampon, vaginal exam    Pain Relieving Factors  no vaginal penetration         [facility] PT Assessment - 11/23/18 0001      Assessment   Medical Diagnosis  N94.819 Vulvodynia    Referring Provider (PT)  Dr. Silas [place]    Onset Date/Surgical Date  --   chronic   Prior Therapy  none       Precautions   Precautions  None      Restrictions   Weight Bearing Restrictions  No      Balance Screen   Has the patient fallen in the past 6 months  No    Has the patient had a decrease in activity level because of a fear of falling?   No    Is the patient reluctant to leave their home because of a fear of falling?   No      Home Film/video editor residence      Prior Function   Level of Independence  Independent    Vocation  Full time employment    Vocation Requirements  sitting, up and down, walking    Leisure  walking, yoga,       Cognition   Overall Cognitive Status  Within Functional Limits for tasks assessed      Posture/Postural Control   Posture/Postural Control  Postural limitations    Postural Limitations  Rounded Shoulders;Forward head      ROM / Strength   AROM / PROM /  Strength  PROM;AROM;Strength      AROM   Lumbar Flexion  decrease by 25%      Palpation   SI assessment   pelvis in correct alignment    Palpation comment  tenderness located on right abdominal, tenderness located in lumbar paraspinals and right gluteal                Objective measurements completed on examination: See above findings.    Pelvic Floor Special Questions - 11/23/18 0001    Currently Sexually Active  Yes    Is this Painful  Yes   initial penetration, unable to climax   Marinoff Scale  pain prevents any attempts at intercourse    Urinary Leakage  No   trouble with fully emptying her bladder   Fecal incontinence  No    Falling out feeling (prolapse)  No    External Perineal Exam  tenderness located on bulbocavernosus and ischiocavernosus with rleft > right    Skin Integrity  Erthema   vulva area   External Palpation  tenderness at the 4 to 8 O'Clock with q-tip    Prolapse  None    Pelvic Floor Internal Exam  Patient confirms identification and approves PT to assess pelvic floor and treatment    Exam Type  Vaginal    Palpation   tenderness located on levator ani and obturator internist left > right    Strength  --   not assessed due to pain and tightness              PT Education - 11/23/18 1244    Education Details  information on pelvic floor meditation, information on you tube video for pelvic floor stretches, samples of [employer] Revelum for vulva care, information on dilators    Person(s) Educated  Patient    Methods  Explanation;Handout    Comprehension  Verbalized understanding       PT Short Term Goals - 11/23/18 1256      PT SHORT TERM GOAL #1   Title  independent with initial HEP    Time  4    Period  Weeks    Status  New    Target Date  12/21/18      PT SHORT TERM GOAL #2   Title  able to fully empty her bladder with 25% greater ease    Time  4    Period  Weeks    Status  New    Target Date  12/21/18      PT SHORT TERM GOAL #3   Title  able to bulge the pelvic floor for relaxation and for a bowel movement    Time  4    Period  Weeks    Status  New    Target Date  12/21/18      PT SHORT TERM GOAL #4   Title  understand how to perform pelvic floor mediation to relax the nervous system    Time  4    Period  Weeks    Status  New    Target Date  12/21/18        PT Long Term Goals - 11/23/18 1258      PT LONG TERM GOAL #1   Title  independent with HEP and understand how to progress herself    Time  12    Period  Weeks    Status  New    Target Date  02/15/19  PT LONG TERM GOAL #2   Title  able to wear a tampon with minimal discomfort due to improve tissue elongation    Time  12    Period  Weeks    Status  New    Target Date  02/15/19      PT LONG TERM GOAL #3   Title  able to fully empty her bladder due to relaxation of the pelvic floor muscles    Time  12    Period  Weeks    Status  New    Target Date  02/15/19      PT LONG TERM GOAL #4   Title  able to use the biggest size of dilator to facilitate intercourse or vaginal exam with minimal to no  discomfort    Time  12    Period  Weeks    Status  New    Target Date  02/15/19      PT LONG TERM GOAL #5   Title  able to self stimulate an orgasm due to pelvic floor muscles able to fully relax and contract    Time  12    Period  Weeks    Status  New    Target Date  02/15/19             Plan - 11/23/18 1247    Clinical Impression Statement  Patient is a 33 year old female with vulvodynia. Patient reports vaginal pain at level 8/10 with penile penetration, vaginal exam, and wearing a tampon. Patient has tenderness located on the right abdominals, lumbar paraspinals, right gluteals, bulbocavernosus and ischiocavernosus with left greater than right, levator ani and obturator internist with left greater than right. Tendernes with Q-tip at 5 O'Clock to 8 O'Clock. Redness noted on the vulva area. Patient was able to tolerate therapist placing her index finger in with minimal pain. Patient reports she has trouble emptying her bladder and sometimes with pushing a bowel movement due to tightness. Patient reports she is unable to climax. Patient will benefit from skilled therapy to elongate pelvic floor tissue, improve mobilty and reduce pain with vaginal penetration and improve urination.    Personal Factors and Comorbidities  Sex    Examination-Activity Limitations  Toileting    Examination-Participation Restrictions  Interpersonal Relationship    Stability/Clinical Decision Making  Evolving/Moderate complexity    Clinical Decision Making  Low    Rehab Potential  Excellent    PT Frequency  1x / week    PT Duration  12 weeks    PT Treatment/Interventions  Biofeedback;Moist Heat;Ultrasound;Therapeutic exercise;Therapeutic activities;Neuromuscular re-education;Patient/family education;Manual techniques;Passive range of motion;Dry needling    PT Next Visit Plan  diaphragmatic breathing, pelvic drop, abdominal soft tissue work and to external perineal.    Consulted and Agree with Plan of Care   Patient       Patient will benefit from skilled therapeutic intervention in order to improve the following deficits and impairments:  Decreased coordination, Increased fascial restricitons, Decreased range of motion, Increased muscle spasms, Decreased activity tolerance, Pain, Decreased strength  Visit Diagnosis: 1. Muscle weakness (generalized)   2. Cramp and spasm   3. Other lack of coordination   4. Vulvodynia        Problem List Patient Active Problem List   Diagnosis Date Noted  . Elevated blood pressure reading 11/16/2018  . Morbidly obese ([place]) 11/16/2018  . 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 HSV-2 (herpes simplex virus 2) infection 05/30/2018  . Dysplasia of cervix, low grade (  CIN 1) 05/25/2018  . Genital warts 05/24/2018  . Inattention 02/18/2018  . Stress at work 02/18/2018  . Migraine 12/28/2017  . Vertigo 12/28/2017  . GAD (generalized anxiety disorder) 01/25/2017  . Adjustment reaction with anxiety and depression 12/15/2016  . Sleep disturbances 12/15/2016  . Family history of Alzheimer's disease 12/15/2016  . Hyperlipidemia 06/22/2014  . Dermatofibroma 06/19/2014  . Epidermal cyst 06/18/2014  . Obesity 06/18/2014  . Routine physical examination 06/18/2014    Earlie Counts, PT 11/23/18 1:04 PM   [facility] [address], [place], [place], [address] Phone: [phone]   Fax:  [phone]  Name: Janet Clayton MRN: [id number] Date of Birth: 06-Nov-1985

## 2018-11-23 NOTE — Patient Instructions (Signed)
  Guided Meditation for Pelvic Floor Relaxation  Fem[employer]itness  Pelvic Floor Release Stretches  FemFusion Fitness   [email or web address]   smiledetergents.[email or web address]/  Moisturizers . They are used in the vagina to hydrate the mucous membrane that make up the vaginal canal. . Designed to keep a more normal acid balance (ph) . Once placed in the vagina, it will last between two to three days.  . Use 2-3 times per week at bedtime and last longer than 60 min. . Ingredients to avoid is glycerin and fragrance, can increase chance of infection . Should not be used just before sex due to causing irritation . Most are gels administered either in a tampon-shaped applicator or as a vaginal suppository. They are non-hormonal.   Types of Moisturizers . Samul Dada- drug store . Vitamin E vaginal suppositories- Whole foods, Amazon . Moist Again . Coconut oil- can break down condoms . Julva- (Do no use if on Tamoxifen) amazon . Yes moisturizer- amazon . NeuEve Silk , NeuEve Silver for menopausal or over 65 (if have severe vaginal atrophy or cancer treatments use NeuEve Silk for  1 month than move to [employer]epsi)- [employer], MapleFlower.dk . Olive and Bee intimate cream- www.oliveandbee.com.au . Mae vaginal moisturizer- Amazon  Creams to use externally on the Vulva area  Albertson's (good for for cancer patients that had radiation to the area)- [place] or [employer].FlyingBasics.com.br  V-magic cream - amazon  Julva-amazon  Vital "V Wild Yam salve ( help moisturize and help with thinning vulvar area, does have [place] by Damiva labial moisturizer ([place],    Things to avoid in the vaginal area . Do not use things to irritate the vulvar area . No lotions just specialized creams for the vulva area- Neogyn, V-magic, No soaps; can use Aveeno or Calendula cleanser if needed. Must  be gentle . No deodorants . No douches . Good to sleep without underwear to let the vaginal area to air out . No scrubbing: spread the lips to let warm water rinse over labias and pat dry  [facility] [address], [place], [place] [address] Phone # [phone] Fax [phone]

## 2018-11-30 ENCOUNTER — Ambulatory Visit: Payer: No Typology Code available for payment source | Admitting: Physical Therapy

## 2018-11-30 ENCOUNTER — Other Ambulatory Visit: Payer: Self-pay

## 2018-11-30 ENCOUNTER — Telehealth: Payer: Self-pay

## 2018-11-30 ENCOUNTER — Encounter: Payer: Self-pay | Admitting: Physical Therapy

## 2018-11-30 DIAGNOSIS — N94819 Vulvodynia, unspecified: Secondary | ICD-10-CM

## 2018-11-30 DIAGNOSIS — M6281 Muscle weakness (generalized): Secondary | ICD-10-CM

## 2018-11-30 DIAGNOSIS — R278 Other lack of coordination: Secondary | ICD-10-CM

## 2018-11-30 DIAGNOSIS — R252 Cramp and spasm: Secondary | ICD-10-CM

## 2018-11-30 NOTE — Therapy (Signed)
[facility] [address], [place], [place], [address] Phone: [phone]   Fax:  [phone]  Physical Therapy Treatment  Patient Details  Name: Janet Clayton MRN: [id number] Date of Birth: 1985/08/08 Referring Provider (PT): Dr. Silas [place]   Encounter Date: 11/30/2018  PT End of Session - 11/30/18 1410    Visit Number  2    Date for PT Re-Evaluation  02/15/19    Authorization Type  UHC    Authorization - Visit Number  2    Authorization - Number of Visits  30    PT Start Time  1330    PT Stop Time  1408    PT Time Calculation (min)  38 min    Activity Tolerance  Patient tolerated treatment well;No increased pain    Behavior During Therapy  WFL for tasks assessed/performed       Past Medical History:  Diagnosis Date  . Vaginal Pap smear, abnormal     History reviewed. No pertinent surgical history.  There were no vitals filed for this visit.  Subjective Assessment - 11/30/18 1339    Subjective  I have not ordered the dilators due to scared of them. I have been doing the stretching video.    Patient Stated Goals  reduce pain with vaginal exam and with penile penetration    Currently in Pain?  Yes    Pain Score  8     Pain Location  Vagina    Pain Orientation  Mid    Pain Descriptors / Indicators  Grimacing   feels like she will rip   Pain Type  Chronic pain    Pain Onset  More than a month ago    Pain Frequency  Intermittent    Aggravating Factors   penile penetration, tampon, vaginal exam    Pain Relieving Factors  no vaginal penetration    Multiple Pain Sites  No                       [facility] Adult PT Treatment/Exercise - 11/30/18 0001      Neuro Re-ed    Neuro Re-ed Details   diaphragmatic breathing to expand her stomach and bulge the pelvic floor and relax when she has pain      Lumbar Exercises: Aerobic   Nustep  5 min, level 1, while assessing patient      Manual Therapy    Manual Therapy  Soft tissue mobilization    Soft tissue mobilization  diaphragm and abdominal tissue to assist the expansion of the diaphragm and bulge the pelvic floor             PT Education - 11/30/18 1407    Education Details  diaphragmatic breathing with pelvic drop    Person(s) Educated  Patient    Methods  Explanation;Demonstration;Handout    Comprehension  Returned demonstration;Verbalized understanding       PT Short Term Goals - 11/23/18 1256      PT SHORT TERM GOAL #1   Title  independent with initial HEP    Time  4    Period  Weeks    Status  New    Target Date  12/21/18      PT SHORT TERM GOAL #2   Title  able to fully empty her bladder with 25% greater ease    Time  4    Period  Weeks    Status  New  Target Date  12/21/18      PT SHORT TERM GOAL #3   Title  able to bulge the pelvic floor for relaxation and for a bowel movement    Time  4    Period  Weeks    Status  New    Target Date  12/21/18      PT SHORT TERM GOAL #4   Title  understand how to perform pelvic floor mediation to relax the nervous system    Time  4    Period  Weeks    Status  New    Target Date  12/21/18        PT Long Term Goals - 11/23/18 1258      PT LONG TERM GOAL #1   Title  independent with HEP and understand how to progress herself    Time  12    Period  Weeks    Status  New    Target Date  02/15/19      PT LONG TERM GOAL #2   Title  able to wear a tampon with minimal discomfort due to improve tissue elongation    Time  12    Period  Weeks    Status  New    Target Date  02/15/19      PT LONG TERM GOAL #3   Title  able to fully empty her bladder due to relaxation of the pelvic floor muscles    Time  12    Period  Weeks    Status  New    Target Date  02/15/19      PT LONG TERM GOAL #4   Title  able to use the biggest size of dilator to facilitate intercourse or vaginal exam with minimal to no discomfort    Time  12    Period  Weeks    Status  New     Target Date  02/15/19      PT LONG TERM GOAL #5   Title  able to self stimulate an orgasm due to pelvic floor muscles able to fully relax and contract    Time  12    Period  Weeks    Status  New    Target Date  02/15/19            Plan - 11/30/18 1338    Clinical Impression Statement  Patient has reduction of tightness in the diaphragm so she is able to expand her abdomen and bulge the pelvic floor. Therapist did not perform pelvic floor soft tissue work due to being on her cycle. Patient had reduction of abdominal tightness after therapy. Patient has not met goals yet due to just starting. Patient still has trouble with emptying her bladder. Patient will benefit from skilled therapy to elongat pelvic flooor tissue, improve mobility and reduce pain with vaginal penetration and improve urination.    Personal Factors and Comorbidities  Sex    Examination-Activity Limitations  Toileting    Examination-Participation Restrictions  Interpersonal Relationship    Stability/Clinical Decision Making  Evolving/Moderate complexity    Rehab Potential  Excellent    PT Frequency  1x / week    PT Duration  12 weeks    PT Treatment/Interventions  Biofeedback;Moist Heat;Ultrasound;Therapeutic exercise;Therapeutic activities;Neuromuscular re-education;Patient/family education;Manual techniques;Passive range of motion;Dry needling    PT Next Visit Plan  release around the vulva for climax, toilet technique, back mobility with pelvic tilt    Consulted and Agree with Plan of Care  Patient       Patient will benefit from skilled therapeutic intervention in order to improve the following deficits and impairments:  Decreased coordination, Increased fascial restricitons, Decreased range of motion, Increased muscle spasms, Decreased activity tolerance, Pain, Decreased strength  Visit Diagnosis: 1. Muscle weakness (generalized)   2. Cramp and spasm   3. Other lack of coordination   4. Vulvodynia         Problem List Patient Active Problem List   Diagnosis Date Noted  . Elevated blood pressure reading 11/16/2018  . Morbidly obese ([place]) 11/16/2018  . 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 HSV-2 (herpes simplex virus 2) infection 05/30/2018  . Dysplasia of cervix, low grade (CIN 1) 05/25/2018  . Genital warts 05/24/2018  . Inattention 02/18/2018  . Stress at work 02/18/2018  . Migraine 12/28/2017  . Vertigo 12/28/2017  . GAD (generalized anxiety disorder) 01/25/2017  . Adjustment reaction with anxiety and depression 12/15/2016  . Sleep disturbances 12/15/2016  . Family history of Alzheimer's disease 12/15/2016  . Hyperlipidemia 06/22/2014  . Dermatofibroma 06/19/2014  . Epidermal cyst 06/18/2014  . Obesity 06/18/2014  . Routine physical examination 06/18/2014    Earlie Counts, PT 11/30/18 2:16 PM   [facility] [address] [place], [place], [address] Phone: [phone]   Fax:  [phone]  Name: Janet Clayton MRN: [id number] Date of Birth: October 11, 1985

## 2018-11-30 NOTE — Patient Instructions (Addendum)
Supine    Lie flat with pillow support. Allow body's muscles to relax. Place hands on belly. Inhale slowly and deeply for _3__ seconds, so hands move up. Then take _3__ seconds to exhale. Repeat _10__ times. Do _2__ times a day. While breathing making your belly big feel the pelvic floor drop and stretch  Copyright  VHI. All rights reserved.    Sitting    Sit comfortably. Allow body's muscles to relax. Place hands on belly. Inhale slowly and deeply for _3__ seconds, so hands move out. Then take _3__ seconds to exhale. Repeat _10__ times. Do _2__ times a day. Feel the pelvic floor drop into the mat.  Copyright  VHI. All rights reserved.  [facility] [address], [place], [place] [address] Phone # [phone] Fax [phone]

## 2018-11-30 NOTE — Telephone Encounter (Signed)
LVM for pt letting her know that we have October schedule and we can schedule her follow up visit with Dr.Leggett. Provided office phone number for pt to call back

## 2018-12-07 ENCOUNTER — Ambulatory Visit: Payer: No Typology Code available for payment source | Admitting: Physical Therapy

## 2018-12-07 ENCOUNTER — Encounter: Payer: Self-pay | Admitting: Physical Therapy

## 2018-12-07 ENCOUNTER — Other Ambulatory Visit: Payer: Self-pay

## 2018-12-07 ENCOUNTER — Telehealth: Payer: Self-pay | Admitting: *Deleted

## 2018-12-07 DIAGNOSIS — R278 Other lack of coordination: Secondary | ICD-10-CM

## 2018-12-07 DIAGNOSIS — M6281 Muscle weakness (generalized): Secondary | ICD-10-CM | POA: Diagnosis not present

## 2018-12-07 DIAGNOSIS — N94819 Vulvodynia, unspecified: Secondary | ICD-10-CM

## 2018-12-07 DIAGNOSIS — R252 Cramp and spasm: Secondary | ICD-10-CM

## 2018-12-07 NOTE — Telephone Encounter (Signed)
Returned call from 1:20pm. Left patient a message to call and schedule appointment for 01/23/19 with Dr. Gala Romney.

## 2018-12-07 NOTE — Patient Instructions (Addendum)
Toileting Techniques for Bowel Movements (Defecation) Using your belly (abdomen) and pelvic floor muscles to have a bowel movement is usually instinctive.  Sometimes people can have problems with these muscles and have to relearn proper defecation (emptying) techniques.  If you have weakness in your muscles, organs that are falling out, decreased sensation in your pelvis, or ignore your urge to go, you may find yourself straining to have a bowel movement.  You are straining if you are: . holding your breath or taking in a huge gulp of air and holding it  . keeping your lips and jaw tensed and closed tightly . turning red in the face because of excessive pushing or forcing . developing or worsening your  hemorrhoids . getting faint while pushing . not emptying completely and have to defecate many times a day  If you are straining, you are actually making it harder for yourself to have a bowel movement.  Many people find they are pulling up with the pelvic floor muscles and closing off instead of opening the anus. Due to lack pelvic floor relaxation and coordination the abdominal muscles, one has to work harder to push the feces out.  Many people have never been taught how to defecate efficiently and effectively.  Notice what happens to your body when you are having a bowel movement.  While you are sitting on the toilet pay attention to the following areas: . Jaw and mouth position . Angle of your hips   . Whether your feet touch the ground or not . Arm placement  . Spine position . Waist . Belly tension . Anus (opening of the anal canal)  An Evacuation/Defecation Plan   Here are the 4 basic points:  1. Lean forward enough for your elbows to rest on your knees 2. Support your feet on the floor or use a low stool if your feet don't touch the floor  3. Push out your belly as if you have swallowed a beach ball-you should feel a widening of your waist 4. Open and relax your pelvic floor muscles,  rather than tightening around the anus       The following conditions my require modifications to your toileting posture:  . If you have had surgery in the past that limits your back, hip, pelvic, knee or ankle flexibility . Constipation   Your healthcare practitioner may make the following additional suggestions and adjustments:  1) Sit on the toilet  a) Make sure your feet are supported. b) Notice your hip angle and spine position-most people find it effective to lean forward or raise their knees, which can help the muscles around the anus to relax  c) When you lean forward, place your forearms on your thighs for support  2) Relax suggestions a) Breath deeply in through your nose and out slowly through your mouth as if you are smelling the flowers and blowing out the candles. b) To become aware of how to relax your muscles, contracting and releasing muscles can be helpful.  Pull your pelvic floor muscles in tightly by using the image of holding back gas, or closing around the anus (visualize making a circle smaller) and lifting the anus up and in.  Then release the muscles and your anus should drop down and feel open. Repeat 5 times ending with the feeling of relaxation. c) Keep your pelvic floor muscles relaxed; let your belly bulge out. d) The digestive tract starts at the mouth and ends at the anal opening, so   be sure to relax both ends of the tube.  Place your tongue on the roof of your mouth with your teeth separated.  This helps relax your mouth and will help to relax the anus at the same time.  3) Empty (defecation) a) Keep your pelvic floor and sphincter relaxed, then bulge your anal muscles.  Make the anal opening wide.  b) Stick your belly out as if you have swallowed a beach ball. c) Make your belly wall hard using your belly muscles while continuing to breathe. Doing this makes it easier to open your anus. d) Breath out and give a grunt (or try using other sounds such as  ahhhh, shhhhh, ohhhh or grrrrrrr).  4) Finish a) As you finish your bowel movement, pull the pelvic floor muscles up and in.  This will leave your anus in the proper place rather than remaining pushed out and down. If you leave your anus pushed out and down, it will start to feel as though that is normal and give you incorrect signals about needing to have a bowel movement. Trigger Point Dry Needling  . What is Trigger Point Dry Needling (DN)? o DN is a physical therapy technique used to treat muscle pain and dysfunction. Specifically, DN helps deactivate muscle trigger points (muscle knots).  o A thin filiform needle is used to penetrate the skin and stimulate the underlying trigger point. The goal is for a local twitch response (LTR) to occur and for the trigger point to relax. No medication of any kind is injected during the procedure.   . What Does Trigger Point Dry Needling Feel Like?  o The procedure feels different for each individual patient. Some patients report that they do not actually feel the needle enter the skin and overall the process is not painful. Very mild bleeding may occur. However, many patients feel a deep cramping in the muscle in which the needle was inserted. This is the local twitch response.   Marland Kitchen How Will I feel after the treatment? o Soreness is normal, and the onset of soreness may not occur for a few hours. Typically this soreness does not last longer than two days.  o Bruising is uncommon, however; ice can be used to decrease any possible bruising.  o In rare cases feeling tired or nauseous after the treatment is normal. In addition, your symptoms may get worse before they get better, this period will typically not last longer than 24 hours.   . What Can I do After My Treatment? o Increase your hydration by drinking more water for the next 24 hours. o You may place ice or heat on the areas treated that have become sore, however, do not use heat on inflamed or  bruised areas. Heat often brings more relief post needling. o You can continue your regular activities, but vigorous activity is not recommended initially after the treatment for 24 hours. o DN is best combined with other physical therapy such as strengthening, stretching, and other therapies.    [facility] [address], [place], [place] [address] Phone # [phone] Fax [phone] STRETCHING THE PELVIC FLOOR MUSCLES NO DILATOR  Supplies . Vaginal lubricant . Mirror (optional) . Gloves (optional) Positioning . Start in a semi-reclined position with your head propped up. Bend your knees and place your thumb or finger at the vaginal opening. Procedure . Apply a moderate amount of lubricant on the outer skin of your vagina, the labia minora.  Apply additional lubricant to  your finger. Marland Kitchen Spread the skin away from the vaginal opening. Place the end of your finger at the opening. . Do a maximum contraction of the pelvic floor muscles. Tighten the vagina and the anus maximally and relax. . When you know they are relaxed, gently and slowly insert your finger into your vagina, directing your finger slightly downward, for 2-3 inches of insertion. . Relax and stretch the 6 o'clock position . Hold each stretch for _2 min__ and repeat __1_ time with rest breaks of _1__ seconds between each stretch. . Repeat the stretching in the 4 o'clock and 8 o'clock positions. . Total time should be _6__ minutes, _1__ x per day.  Note the amount of theme your were able to achieve and your tolerance to your finger in your vagina. . Once you have accomplished the techniques you may try them in standing with one foot resting on the tub, or in other positions.  This is a good stretch to do in the shower if you don't need to use lubricant.

## 2018-12-07 NOTE — Therapy (Signed)
[facility] [address] [place], [place], [address] Phone: [phone]   Fax:  [phone]  Physical Therapy Treatment  Patient Details  Name: Janet Clayton MRN: [id number] Date of Birth: [birth year] Referring Provider (PT): Dr. Silas Sacramento   Encounter Date: 12/07/2018  PT End of Session - 12/07/18 1330    Visit Number  3    Date for PT Re-Evaluation  02/15/19    Authorization Type  [employer]    Authorization - Visit Number  3    Authorization - Number of Visits  30    PT Start Time  1330    PT Stop Time  1410    PT Time Calculation (min)  40 min    Activity Tolerance  Patient tolerated treatment well;No increased pain    Behavior During Therapy  WFL for tasks assessed/performed       Past Medical History:  Diagnosis Date  . Vaginal Pap smear, abnormal     History reviewed. No pertinent surgical history.  There were no vitals filed for this visit.  Subjective Assessment - 12/07/18 1332    Subjective  The breathing exercises are helping. I do them alot before bed and I feel relaxed.    Patient Stated Goals  reduce pain with vaginal exam and with penile penetration    Currently in Pain?  Yes    Pain Score  8     Pain Location  Vagina    Pain Orientation  Mid    Pain Descriptors / Indicators  Grimacing    Pain Type  Chronic pain    Pain Onset  More than a month ago    Pain Frequency  Intermittent    Aggravating Factors   penile penetration, tampon, vaginal exam    Pain Relieving Factors  no vaginal penetration                       [facility] Adult PT Treatment/Exercise - 12/07/18 0001      Therapeutic Activites    Therapeutic Activities  Other Therapeutic Activities    Other Therapeutic Activities  instruction on correct toileting wiith breathing, relaxing pelvic floor, and posture      Neuro Re-ed    Neuro Re-ed Details   instruction on body scanning and perfromed a whole scan with patient       Manual Therapy   Manual Therapy  Soft tissue mobilization;Internal Pelvic Floor    Manual therapy comments  educated patient on how to perform self soft tissue work to the vilva and bulbocavernosus    Soft tissue mobilization  using assistive device to massage the lumbar paraspinals and elongate tissue    Internal Pelvic Floor  soft tissue work to the vulva area and bulbocavernosus       Trigger Point Dry Needling - 12/07/18 0001    Consent Given?  Yes    Education Handout Provided  Yes    Muscles Treated Back/Hip  Lumbar multifidi    Lumbar multifidi Response  Twitch response elicited;Palpable increased muscle length           PT Education - 12/07/18 1401    Education Details  information on dry needling and toileting technique, body scan; perineal massage    Person(s) Educated  Patient    Methods  Explanation;Demonstration;Handout    Comprehension  Returned demonstration;Verbalized understanding       PT Short Term Goals - 12/07/18 1420      PT  SHORT TERM GOAL #1   Title  independent with initial HEP    Time  4    Period  Weeks    Status  Achieved    Target Date  12/21/18      PT SHORT TERM GOAL #2   Title  able to fully empty her bladder with 25% greater ease    Time  4    Period  Weeks    Status  On-going      PT SHORT TERM GOAL #3   Title  able to bulge the pelvic floor for relaxation and for a bowel movement    Time  4    Period  Weeks    Status  On-going    Target Date  12/21/18      PT SHORT TERM GOAL #4   Title  understand how to perform pelvic floor mediation to relax the nervous system    Time  4    Period  Weeks    Status  Achieved    Target Date  12/21/18        PT Long Term Goals - 11/23/18 1258      PT LONG TERM GOAL #1   Title  independent with HEP and understand how to progress herself    Time  12    Period  Weeks    Status  New    Target Date  02/15/19      PT LONG TERM GOAL #2   Title  able to wear a tampon with minimal  discomfort due to improve tissue elongation    Time  12    Period  Weeks    Status  New    Target Date  02/15/19      PT LONG TERM GOAL #3   Title  able to fully empty her bladder due to relaxation of the pelvic floor muscles    Time  12    Period  Weeks    Status  New    Target Date  02/15/19      PT LONG TERM GOAL #4   Title  able to use the biggest size of dilator to facilitate intercourse or vaginal exam with minimal to no discomfort    Time  12    Period  Weeks    Status  New    Target Date  02/15/19      PT LONG TERM GOAL #5   Title  able to self stimulate an orgasm due to pelvic floor muscles able to fully relax and contract    Time  12    Period  Weeks    Status  New    Target Date  02/15/19            Plan - 12/07/18 1331    Clinical Impression Statement  Patient has learned how to toilet correctly and feels the pelvic floor relax. Patient has learned how to perform a body scan to reduce tension in the body. Patient has improved tissue mobility in the lumbar that will decrease muscle tension in the pelvic floor. Patient was able to tolerate therapist placing her index finger into the vaginal canal with no more than 3/10. Patient will benefit from skilled therapy to elongate pelvic floor tissue, improve mobiliyt and reduce pain with vaginal penetrion and improve urination.    Personal Factors and Comorbidities  Sex    Examination-Activity Limitations  Toileting    Examination-Participation Restrictions  Interpersonal Relationship    Stability/Clinical  Decision Making  Evolving/Moderate complexity    Rehab Potential  Excellent    PT Frequency  1x / week    PT Duration  12 weeks    PT Treatment/Interventions  Biofeedback;Moist Heat;Ultrasound;Therapeutic exercise;Therapeutic activities;Neuromuscular re-education;Patient/family education;Manual techniques;Passive range of motion;Dry needling    PT Next Visit Plan  work internally on pelvic floor, assesses dry  needling; bulging of the pelvic floor    Recommended Other Services  MD signed intial eval    Consulted and Agree with Plan of Care  Patient       Patient will benefit from skilled therapeutic intervention in order to improve the following deficits and impairments:  Decreased coordination, Increased fascial restricitons, Decreased range of motion, Increased muscle spasms, Decreased activity tolerance, Pain, Decreased strength  Visit Diagnosis: 1. Muscle weakness (generalized)   2. Cramp and spasm   3. Other lack of coordination   4. Vulvodynia        Problem List Patient Active Problem List   Diagnosis Date Noted  . Elevated blood pressure reading 11/16/2018  . Morbidly obese ([place]) 11/16/2018  . 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 HSV-2 (herpes simplex virus 2) infection 05/30/2018  . Dysplasia of cervix, low grade (CIN 1) 05/25/2018  . Genital warts 05/24/2018  . Inattention 02/18/2018  . Stress at work 02/18/2018  . Migraine 12/28/2017  . Vertigo 12/28/2017  . GAD (generalized anxiety disorder) 01/25/2017  . Adjustment reaction with anxiety and depression 12/15/2016  . Sleep disturbances 12/15/2016  . Family history of Alzheimer's disease 12/15/2016  . Hyperlipidemia 06/22/2014  . Dermatofibroma 06/19/2014  . Epidermal cyst 06/18/2014  . Obesity 06/18/2014  . Routine physical examination 06/18/2014    Earlie Counts, PT 12/07/18 2:22 PM   [facility] [address], [place], [place], [address] Phone: [phone]   Fax:  [phone]  Name: Janet Clayton MRN: [id number] Date of Birth: 04-01-1986

## 2018-12-14 ENCOUNTER — Encounter: Payer: Self-pay | Admitting: Physical Therapy

## 2018-12-14 ENCOUNTER — Ambulatory Visit: Payer: No Typology Code available for payment source | Admitting: Physical Therapy

## 2018-12-14 DIAGNOSIS — N94819 Vulvodynia, unspecified: Secondary | ICD-10-CM

## 2018-12-14 DIAGNOSIS — M6281 Muscle weakness (generalized): Secondary | ICD-10-CM | POA: Diagnosis not present

## 2018-12-14 DIAGNOSIS — R252 Cramp and spasm: Secondary | ICD-10-CM

## 2018-12-14 DIAGNOSIS — R278 Other lack of coordination: Secondary | ICD-10-CM

## 2018-12-14 NOTE — Therapy (Signed)
[facility] [address] [place], [place], [address] Phone: [phone]   Fax:  [phone]  Physical Therapy Treatment  Patient Details  Name: Janet Clayton MRN: [id number] Date of Birth: 08-14-1985 Referring Provider (PT): Dr. Silas Sacramento   Encounter Date: 12/14/2018  PT End of Session - 12/14/18 1410    Visit Number  4    Date for PT Re-Evaluation  02/15/19    Authorization Type  UHC    Authorization - Visit Number  4    Authorization - Number of Visits  30    PT Start Time  1330    PT Stop Time  1408    PT Time Calculation (min)  38 min    Activity Tolerance  Patient tolerated treatment well;No increased pain    Behavior During Therapy  WFL for tasks assessed/performed       Past Medical History:  Diagnosis Date  . Vaginal Pap smear, abnormal     History reviewed. No pertinent surgical history.  There were no vitals filed for this visit.  Subjective Assessment - 12/14/18 1334    Subjective  I have not done my exercises that well this week due to being busy. Things are feel better and less tension. I was a little sore after the dry needling for several days.    Patient Stated Goals  reduce pain with vaginal exam and with penile penetration    Currently in Pain?  Yes    Pain Score  8     Pain Location  Vagina    Pain Orientation  Mid    Pain Descriptors / Indicators  Grimacing    Pain Type  Chronic pain    Pain Onset  More than a month ago    Pain Frequency  Intermittent    Aggravating Factors   penile penetration, tampon, vaginal exam    Pain Relieving Factors  no vaginal penetration    Multiple Pain Sites  No                    Pelvic Floor Special Questions - 12/14/18 0001    Pelvic Floor Internal Exam  Patient confirms identification and approves PT to assess pelvic floor and treatment    Exam Type  Vaginal    Palpation  tenderness located on the left levator ani    Strength   fair squeeze, definite lift        [facility] Adult PT Treatment/Exercise - 12/14/18 0001      Lumbar Exercises: Stretches   Active Hamstring Stretch  Right;Left;2 reps;30 seconds   with strap   Active Hamstring Stretch Limitations  then do hip adductors    Piriformis Stretch  Right;Left;1 rep;30 seconds   th strap     Manual Therapy   Manual Therapy  Internal Pelvic Floor    Internal Pelvic Floor  soft tissue work to the vulva area and bulbocavernosus and left levator ani             PT Education - 12/14/18 1409    Education Details  using a mirror to bulge the pelvic floor to elongate the muscles    Person(s) Educated  Patient    Methods  Explanation    Comprehension  Verbalized understanding       PT Short Term Goals - 12/14/18 1414      PT SHORT TERM GOAL #1   Title  independent with initial HEP    Time  4    Period  Weeks    Status  Achieved      PT SHORT TERM GOAL #2   Title  able to fully empty her bladder with 25% greater ease    Time  4    Period  Weeks    Status  On-going    Target Date  12/21/18      PT SHORT TERM GOAL #3   Title  able to bulge the pelvic floor for relaxation and for a bowel movement    Baseline  just learned    Time  4    Period  Weeks    Status  On-going    Target Date  12/21/18      PT SHORT TERM GOAL #4   Title  understand how to perform pelvic floor mediation to relax the nervous system    Time  4    Period  Weeks    Status  Achieved    Target Date  12/21/18        PT Long Term Goals - 11/23/18 1258      PT LONG TERM GOAL #1   Title  independent with HEP and understand how to progress herself    Time  12    Period  Weeks    Status  New    Target Date  02/15/19      PT LONG TERM GOAL #2   Title  able to wear a tampon with minimal discomfort due to improve tissue elongation    Time  12    Period  Weeks    Status  New    Target Date  02/15/19      PT LONG TERM GOAL #3   Title  able to fully empty her bladder  due to relaxation of the pelvic floor muscles    Time  12    Period  Weeks    Status  New    Target Date  02/15/19      PT LONG TERM GOAL #4   Title  able to use the biggest size of dilator to facilitate intercourse or vaginal exam with minimal to no discomfort    Time  12    Period  Weeks    Status  New    Target Date  02/15/19      PT LONG TERM GOAL #5   Title  able to self stimulate an orgasm due to pelvic floor muscles able to fully relax and contract    Time  12    Period  Weeks    Status  New    Target Date  02/15/19            Plan - 12/14/18 1339    Clinical Impression Statement  Patient able to touch the external perineal with no pain now. Patient left pelvic floor is tighter and tender compared to the right. Patient has more flexibility in the hips after the dry needling. Patient Pelvic floor strength is 3/5 with circular contraction. Patient understands how to perform bulging of the pelvic floor with a mirror and is doing her perineal soft tissue work. Patient will benefit from skiled therapy to elongate pelvic floor tissue, improve mobility, and reduce pain with vaginal penetration and improve urination    Personal Factors and Comorbidities  Sex    Examination-Activity Limitations  Toileting    Examination-Participation Restrictions  Interpersonal Relationship    Stability/Clinical Decision Making  Evolving/Moderate complexity    Rehab Potential  Excellent    PT Frequency  1x / week    PT Duration  12 weeks    PT Treatment/Interventions  Biofeedback;Moist Heat;Ultrasound;Therapeutic exercise;Therapeutic activities;Neuromuscular re-education;Patient/family education;Manual techniques;Passive range of motion;Dry needling    PT Next Visit Plan  work internally on pelvic floor, dry needling of left piriformis; ask about pushing a bowel movement out    Consulted and Agree with Plan of Care  Patient       Patient will benefit from skilled therapeutic intervention in  order to improve the following deficits and impairments:  Decreased coordination, Increased fascial restricitons, Decreased range of motion, Increased muscle spasms, Decreased activity tolerance, Pain, Decreased strength  Visit Diagnosis: Muscle weakness (generalized)  Cramp and spasm  Other lack of coordination  Vulvodynia     Problem List Patient Active Problem List   Diagnosis Date Noted  . Elevated blood pressure reading 11/16/2018  . Morbidly obese ([place]) 11/16/2018  . 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 HSV-2 (herpes simplex virus 2) infection 05/30/2018  . Dysplasia of cervix, low grade (CIN 1) 05/25/2018  . Genital warts 05/24/2018  . Inattention 02/18/2018  . Stress at work 02/18/2018  . Migraine 12/28/2017  . Vertigo 12/28/2017  . GAD (generalized anxiety disorder) 01/25/2017  . Adjustment reaction with anxiety and depression 12/15/2016  . Sleep disturbances 12/15/2016  . Family history of Alzheimer's disease 12/15/2016  . Hyperlipidemia 06/22/2014  . Dermatofibroma 06/19/2014  . Epidermal cyst 06/18/2014  . Obesity 06/18/2014  . Routine physical examination 06/18/2014    Earlie Counts, PT 12/14/18 2:20 PM   [facility] [address], [place], [place], [address] Phone: [phone]   Fax:  [phone]  Name: Janet Clayton MRN: [id number] Date of Birth: [birth year]

## 2018-12-21 ENCOUNTER — Other Ambulatory Visit: Payer: Self-pay

## 2018-12-21 ENCOUNTER — Ambulatory Visit: Payer: No Typology Code available for payment source | Attending: Obstetrics & Gynecology | Admitting: Physical Therapy

## 2018-12-21 ENCOUNTER — Encounter: Payer: Self-pay | Admitting: Physical Therapy

## 2018-12-21 DIAGNOSIS — M6281 Muscle weakness (generalized): Secondary | ICD-10-CM

## 2018-12-21 DIAGNOSIS — N94819 Vulvodynia, unspecified: Secondary | ICD-10-CM | POA: Diagnosis present

## 2018-12-21 DIAGNOSIS — R252 Cramp and spasm: Secondary | ICD-10-CM | POA: Diagnosis present

## 2018-12-21 DIAGNOSIS — R278 Other lack of coordination: Secondary | ICD-10-CM

## 2018-12-21 NOTE — Patient Instructions (Signed)
Access Code: [facility]  URL: [email or web address]  Date: 12/21/2018  Prepared by: Earlie Counts   Exercises Prone Press Up - 10 reps - 1 sets - 1x daily - 7x weekly Cat-Camel - 10 reps - 1 sets - 1x daily - 7x weekly Quadruped Rocking Slow - 10 reps - 1 sets - 1x daily - 7x weekly Ardine Eng Pose - 2 reps - 1 sets - 30 sec hold - 1x daily - 7x weekly Ardine Eng Pose Side Bend - 2 reps - 1 sets - 30 sec hold - 1x daily - 7x weekly Supine Piriformis Stretch - 2 reps - 1 sets - 30 sec hold - 1x daily - 7x weekly Supine Lower Trunk Rotation - 2 reps - 1 sets - 30 sec hold - 1x daily - 7x weekly Supine Pelvic Floor Stretch - 1 reps - 1 sets - 1 min hold - 1x daily - 7x weekly Sidelying Quadriceps Stretch - 2 reps - 1 sets - 30 sec hold - 1x daily - 7x weekly [facility] [address], [place], [place] [address] Phone # [phone] Fax [phone]

## 2018-12-21 NOTE — Therapy (Addendum)
[facility] [address] [place], [place], [address] Phone: [phone]   Fax:  [phone]  Physical Therapy Treatment  Patient Details  Name: Janet Clayton MRN: [id number] Date of Birth: January 23, 1986 Referring Provider (PT): Dr. Silas Sacramento   Encounter Date: 12/21/2018  PT End of Session - 12/21/18 1337    Visit Number  5    Date for PT Re-Evaluation  02/15/19    Authorization Type  [employer]    Authorization - Visit Number  5    Authorization - Number of Visits  30    PT Start Time  1330    PT Stop Time  1408    PT Time Calculation (min)  38 min    Activity Tolerance  Patient tolerated treatment well;No increased pain    Behavior During Therapy  WFL for tasks assessed/performed       Past Medical History:  Diagnosis Date  . Vaginal Pap smear, abnormal     History reviewed. No pertinent surgical history.  There were no vitals filed for this visit.  Subjective Assessment - 12/21/18 1333    Subjective  I have been doing the bulge of the pelvic floor and not sure if I am doing it right. The breathing exercises are helping and the muscles are relaxing. Pushing out a bowel movement is not easier but after it is not.    Patient Stated Goals  reduce pain with vaginal exam and with penile penetration    Currently in Pain?  Yes    Pain Score  8     Pain Location  Vagina    Pain Orientation  Mid    Pain Descriptors / Indicators  Grimacing    Pain Type  Chronic pain    Pain Onset  More than a month ago    Aggravating Factors   penile penetration, tampon, vaginal exam    Pain Relieving Factors  no vaginal penetration    Multiple Pain Sites  No                       [facility] Adult PT Treatment/Exercise - 12/21/18 0001      Lumbar Exercises: Stretches   Press Ups  10 reps;5 seconds    Quadruped Mid Back Stretch  3 reps;30 seconds    Quadruped Mid Back Stretch Limitations  all three ways with pillow  between hips and lower leg    Quad Stretch  Right;Left;1 rep;30 seconds   sidely   Piriformis Stretch  Right;Left;1 rep;30 seconds   supine   Other Lumbar Stretch Exercise  cat cow 15 times; quadruped rocking back and forth    Other Lumbar Stretch Exercise  happy baby      Manual Therapy   Manual Therapy  Internal Pelvic Floor    Internal Pelvic Floor  soft tissue work to the vulva area and bulbocavernosus and left levator ani             PT Education - 12/21/18 1353    Education Details  Access Code: [email or web address]    Person(s) Educated  Patient    Methods  Explanation;Demonstration;Verbal cues;Handout    Comprehension  Returned demonstration;Verbalized understanding       PT Short Term Goals - 12/21/18 1511      PT SHORT TERM GOAL #2   Title  able to fully empty her bladder with 25% greater ease    Time  4    Period  Weeks  Status  Achieved    Target Date  12/21/18      PT SHORT TERM GOAL #3   Title  able to bulge the pelvic floor for relaxation and for a bowel movement    Time  4    Period  Weeks    Status  Achieved    Target Date  12/21/18        PT Long Term Goals - 11/23/18 1258      PT LONG TERM GOAL #1   Title  independent with HEP and understand how to progress herself    Time  12    Period  Weeks    Status  New    Target Date  02/15/19      PT LONG TERM GOAL #2   Title  able to wear a tampon with minimal discomfort due to improve tissue elongation    Time  12    Period  Weeks    Status  New    Target Date  02/15/19      PT LONG TERM GOAL #3   Title  able to fully empty her bladder due to relaxation of the pelvic floor muscles    Time  12    Period  Weeks    Status  New    Target Date  02/15/19      PT LONG TERM GOAL #4   Title  able to use the biggest size of dilator to facilitate intercourse or vaginal exam with minimal to no discomfort    Time  12    Period  Weeks    Status  New    Target Date  02/15/19      PT LONG TERM GOAL #5    Title  able to self stimulate an orgasm due to pelvic floor muscles able to fully relax and contract    Time  12    Period  Weeks    Status  New    Target Date  02/15/19            Plan - 12/21/18 1337    Clinical Impression Statement  Patient reports it is 85% easier to empty her bladder. Patient is able to bulge her pelvic floor with verbal cues. Patient is working on the perineal soft tissue. Patient still has some burning of the vulva during soft tissue work. Patient was educated on pelvic and hip stretches to elongate the tissue. Patient will benefit from skilled therapy to elongate pelvic floor tisue, improve mobility, and reduce pain and vaginal penetration and improve urination.    Personal Factors and Comorbidities  Sex    Examination-Activity Limitations  Toileting    Examination-Participation Restrictions  Interpersonal Relationship    Stability/Clinical Decision Making  Evolving/Moderate complexity    Rehab Potential  Excellent    PT Frequency  1x / week    PT Duration  12 weeks    PT Treatment/Interventions  Biofeedback;Moist Heat;Ultrasound;Therapeutic exercise;Therapeutic activities;Neuromuscular re-education;Patient/family education;Manual techniques;Passive range of motion;Dry needling    PT Next Visit Plan  work internally on pelvic floor, dry needling of left piriformis; go over toileting; biofeedback for pelvic floor relaxation    PT Home Exercise Plan  Access Code: [email or web address]    Consulted and Agree with Plan of Care  Patient       Patient will benefit from skilled therapeutic intervention in order to improve the following deficits and impairments:  Decreased coordination, Increased fascial restricitons, Decreased range of motion, Increased muscle spasms, Decreased  activity tolerance, Pain, Decreased strength  Visit Diagnosis: Muscle weakness (generalized)  Cramp and spasm  Other lack of coordination  Vulvodynia     Problem List Patient Active Problem  List   Diagnosis Date Noted  . Elevated blood pressure reading 11/16/2018  . Morbidly obese ([place]) 11/16/2018  . 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 HSV-2 (herpes simplex virus 2) infection 05/30/2018  . Dysplasia of cervix, low grade (CIN 1) 05/25/2018  . Genital warts 05/24/2018  . Inattention 02/18/2018  . Stress at work 02/18/2018  . Migraine 12/28/2017  . Vertigo 12/28/2017  . GAD (generalized anxiety disorder) 01/25/2017  . Adjustment reaction with anxiety and depression 12/15/2016  . Sleep disturbances 12/15/2016  . Family history of Alzheimer's disease 12/15/2016  . Hyperlipidemia 06/22/2014  . Dermatofibroma 06/19/2014  . Epidermal cyst 06/18/2014  . Obesity 06/18/2014  . Routine physical examination 06/18/2014    Earlie Counts, PT 12/21/18 3:12 PM    [facility] [address] [place], [place], [address] Phone: [phone]   Fax:  [phone]  Name: Janet Clayton MRN: [id number] Date of Birth: August 06, 1985  PHYSICAL THERAPY DISCHARGE SUMMARY  Visits from Start of Care: 5  Current functional level related to goals / functional outcomes: See above. Patient did not return to therapy so not able to further assess patient.    Remaining deficits: See above.    Education / Equipment: HEP Plan:                                                    Patient goals were not met. Patient is being discharged due to not returning since the last visit. Thank you for the referral. Earlie Counts, PT 02/24/19 8:30 AM   ?????

## 2018-12-28 ENCOUNTER — Encounter: Payer: No Typology Code available for payment source | Admitting: Physical Therapy

## 2019-01-04 ENCOUNTER — Encounter: Payer: No Typology Code available for payment source | Admitting: Physical Therapy

## 2019-01-18 ENCOUNTER — Ambulatory Visit: Payer: No Typology Code available for payment source | Admitting: Physician Assistant

## 2019-01-23 ENCOUNTER — Ambulatory Visit: Payer: No Typology Code available for payment source | Admitting: Obstetrics & Gynecology

## 2019-01-30 ENCOUNTER — Ambulatory Visit: Payer: No Typology Code available for payment source | Admitting: Obstetrics & Gynecology

## 2019-01-30 ENCOUNTER — Other Ambulatory Visit: Payer: Self-pay

## 2019-01-30 ENCOUNTER — Encounter: Payer: Self-pay | Admitting: Obstetrics & Gynecology

## 2019-01-30 VITALS — BP 140/93 | HR 78 | Ht 66.0 in | Wt 266.0 lb

## 2019-01-30 DIAGNOSIS — F329 Major depressive disorder, single episode, unspecified: Secondary | ICD-10-CM | POA: Diagnosis not present

## 2019-01-30 DIAGNOSIS — F32A Depression, unspecified: Secondary | ICD-10-CM

## 2019-01-30 DIAGNOSIS — N94819 Vulvodynia, unspecified: Secondary | ICD-10-CM

## 2019-01-30 MED ORDER — DULOXETINE HCL 20 MG PO CPEP: 20.0000 mg | ORAL_CAPSULE | Freq: Every day | ORAL | 3 refills | Status: DC

## 2019-01-30 NOTE — Progress Notes (Signed)
   Subjective:    Patient ID: Janet Clayton, female    DOB: [birth year], 33 y.o.   MRN: [id number]  HPI  33 yo female presents for follow up of vulvodynia.  Pt has been going to PT and doing better.  Still can't insert a tampon without pain.  Not sexually active at this time (not in a relationship).  Pt on Zoloft for depression.  She has good days and bad days.  She in open to changing meds.    Review of Systems  Constitutional: Negative.   Respiratory: Negative.   Cardiovascular: Negative.   Gastrointestinal: Negative.   Genitourinary: Positive for vaginal pain.       Objective:   Physical Exam Vitals signs reviewed.  Constitutional:      General: She is not in acute distress.    Appearance: She is well-developed.  HENT:     Head: Normocephalic and atraumatic.  Eyes:     Conjunctiva/sclera: Conjunctivae normal.  Cardiovascular:     Rate and Rhythm: Normal rate.  Pulmonary:     Effort: Pulmonary effort is normal.  Abdominal:     General: Bowel sounds are normal. There is no distension.     Palpations: Abdomen is soft. There is no mass.     Tenderness: There is no abdominal tenderness. There is no guarding or rebound.  Genitourinary:    Comments: Less pain on q tip exam of introitus; moderate pain from 4-7 o'clock. Skin:    General: Skin is warm and dry.  Neurological:     Mental Status: She is alert and oriented to person, place, and time.   PHQ=3 GAD=4    Assessment & Plan:  33 yo female with improvement of vulvodynia with PT  1.  Continue PT 2.  Change Zoloft to Cymbalta.  20 mg dialy.  Pt will message me and if not better with increase to 40 mg daily.  Will track depression and anxiety with PHQ9 and GAD 7.

## 2019-02-17 ENCOUNTER — Encounter

## 2019-02-17 ENCOUNTER — Other Ambulatory Visit: Payer: Self-pay | Admitting: Obstetrics & Gynecology

## 2019-02-17 ENCOUNTER — Other Ambulatory Visit: Payer: Self-pay

## 2019-02-17 DIAGNOSIS — F419 Anxiety disorder, unspecified: Secondary | ICD-10-CM

## 2019-02-17 DIAGNOSIS — F329 Major depressive disorder, single episode, unspecified: Secondary | ICD-10-CM

## 2019-02-17 DIAGNOSIS — F32A Depression, unspecified: Secondary | ICD-10-CM

## 2019-02-17 MED ORDER — DULOXETINE HCL 20 MG PO CPEP: 40.0000 mg | ORAL_CAPSULE | Freq: Every day | ORAL | 3 refills | Status: DC

## 2019-02-17 NOTE — Progress Notes (Signed)
cymbalta to be increased to 40 mg daily.

## 2019-02-17 NOTE — Progress Notes (Signed)
Cymbalta 40mg  sent in per DrLeggett

## 2019-04-03 ENCOUNTER — Telehealth (INDEPENDENT_AMBULATORY_CARE_PROVIDER_SITE_OTHER): Payer: No Typology Code available for payment source | Admitting: Obstetrics & Gynecology

## 2019-04-03 DIAGNOSIS — Z3009 Encounter for other general counseling and advice on contraception: Secondary | ICD-10-CM

## 2019-04-03 NOTE — Progress Notes (Signed)
Patient ID: Janet Clayton, female   DOB: 1985/05/17, 33 y.o.   MRN: [id number]    TELEHEALTH VIRTUAL GYNECOLOGY VISIT ENCOUNTER NOTE  I connected with Janet Clayton on 04/03/19 at  1:30 PM EST by telephone at home and verified that I am speaking with the correct person using two identifiers.   I discussed the limitations, risks, security and privacy concerns of performing an evaluation and management service by telephone and the availability of in person appointments. I also discussed with the patient that there may be a patient responsible charge related to this service. The patient expressed understanding and agreed to proceed.   History:  Janet Clayton is a 33 y.o. G0P0000 female being evaluated today for contraception and tubal ligation. Pt has not had any interest in having children and never has been.       Past Medical History:  Diagnosis Date  . Vaginal Pap smear, abnormal    No past surgical history on file. The following portions of the patient's history were reviewed and updated as appropriate: allergies, current medications, past family history, past medical history, past social history, past surgical history and problem list.   Health Maintenance:  Followed for CIN 1  Review of Systems:  Pertinent items noted in HPI and remainder of comprehensive ROS otherwise negative.  Physical Exam:   General:  Alert, oriented and cooperative.   Mental Status: Normal mood and affect perceived. Normal judgment and thought content.  Physical exam deferred due to nature of the encounter  Labs and Imaging No results found for this or any previous visit (from the past 336 hour(s)). No results found.    Assessment and Plan:     33 yo female wanting BTL.   I discussed the assessment and treatment plan with the patient. The patient was provided an opportunity to ask questions and all were answered. The patient agreed with the plan and demonstrated an understanding of the  instructions.   Patient desires permanent sterilization.  Other reversible forms of contraception were discussed with patient; she declines all other modalities. Risks of procedure discussed with patient including but not limited to: risk of regret, permanence of method, bleeding, infection, injury to surrounding organs and need for additional procedures.  Failure risk of 0.5-1% with increased risk of ectopic gestation if pregnancy occurs was also discussed with patient.  Patient verbalized understanding of these risks and wants to proceed with sterilization.    I provided 25 minutes of non-face-to-face time during this encounter for counseling.   Silas Sacramento, MD [facility] [place]

## 2019-04-04 ENCOUNTER — Telehealth: Payer: Self-pay | Admitting: Obstetrics & Gynecology

## 2019-04-04 ENCOUNTER — Encounter: Payer: Self-pay | Admitting: Obstetrics & Gynecology

## 2019-04-04 NOTE — Telephone Encounter (Signed)
I received a message from Dr. Gala Romney that Janet Clayton wants permanent sterility. She is not married and has not kids. She is certain that she does not want any in the future. She wants the tubes removed instead of Filsche clips as she has read that there is more post op pain with clips. I told her that I am not aware of that but I did tell her that the incidence of ovarian cancer is decreased when the tubes are removed. She wants a bilateral salpingectomy after the holidays. I sent Drema Balzarine a message to schedule this.

## 2019-05-31 ENCOUNTER — Encounter (HOSPITAL_BASED_OUTPATIENT_CLINIC_OR_DEPARTMENT_OTHER): Payer: Self-pay | Admitting: Obstetrics & Gynecology

## 2019-05-31 ENCOUNTER — Other Ambulatory Visit: Payer: Self-pay

## 2019-06-03 ENCOUNTER — Other Ambulatory Visit (HOSPITAL_COMMUNITY)
Admission: RE | Admit: 2019-06-03 | Discharge: 2019-06-03 | Disposition: A | Discharge status: Home / Self Care | Payer: No Typology Code available for payment source | Source: Ambulatory Visit | Attending: Obstetrics & Gynecology | Admitting: Obstetrics & Gynecology

## 2019-06-03 DIAGNOSIS — Z01812 Encounter for preprocedural laboratory examination: Secondary | ICD-10-CM | POA: Diagnosis not present

## 2019-06-03 DIAGNOSIS — Z20822 Contact with and (suspected) exposure to covid-19: Secondary | ICD-10-CM | POA: Diagnosis not present

## 2019-06-03 LAB — SARS CORONAVIRUS 2 (TAT 6-24 HRS): SARS Coronavirus 2: NEGATIVE

## 2019-06-05 ENCOUNTER — Encounter (HOSPITAL_BASED_OUTPATIENT_CLINIC_OR_DEPARTMENT_OTHER)
Admission: RE | Admit: 2019-06-05 | Discharge: 2019-06-05 | Disposition: A | Discharge status: Home / Self Care | Payer: No Typology Code available for payment source | Source: Ambulatory Visit | Attending: Obstetrics & Gynecology | Admitting: Obstetrics & Gynecology

## 2019-06-05 ENCOUNTER — Other Ambulatory Visit: Payer: Self-pay

## 2019-06-05 DIAGNOSIS — Z01812 Encounter for preprocedural laboratory examination: Secondary | ICD-10-CM | POA: Diagnosis not present

## 2019-06-05 LAB — POCT PREGNANCY, URINE: Preg Test, Ur: NEGATIVE

## 2019-06-05 NOTE — Progress Notes (Signed)

## 2019-06-07 ENCOUNTER — Encounter (HOSPITAL_BASED_OUTPATIENT_CLINIC_OR_DEPARTMENT_OTHER)
Admission: RE | Disposition: A | Discharge status: Home / Self Care | Payer: Self-pay | Source: Home / Self Care | Attending: Obstetrics & Gynecology

## 2019-06-07 ENCOUNTER — Ambulatory Visit (HOSPITAL_BASED_OUTPATIENT_CLINIC_OR_DEPARTMENT_OTHER): Payer: No Typology Code available for payment source | Admitting: Anesthesiology

## 2019-06-07 ENCOUNTER — Encounter (HOSPITAL_BASED_OUTPATIENT_CLINIC_OR_DEPARTMENT_OTHER): Payer: Self-pay | Admitting: Obstetrics & Gynecology

## 2019-06-07 ENCOUNTER — Other Ambulatory Visit: Payer: Self-pay

## 2019-06-07 ENCOUNTER — Ambulatory Visit (HOSPITAL_BASED_OUTPATIENT_CLINIC_OR_DEPARTMENT_OTHER)
Admission: RE | Admit: 2019-06-07 | Discharge: 2019-06-07 | Disposition: A | Discharge status: Home / Self Care | Payer: No Typology Code available for payment source | Attending: Obstetrics & Gynecology | Admitting: Obstetrics & Gynecology

## 2019-06-07 DIAGNOSIS — Z6841 Body Mass Index (BMI) 40.0 and over, adult: Secondary | ICD-10-CM | POA: Insufficient documentation

## 2019-06-07 DIAGNOSIS — F419 Anxiety disorder, unspecified: Secondary | ICD-10-CM | POA: Insufficient documentation

## 2019-06-07 DIAGNOSIS — Z302 Encounter for sterilization: Secondary | ICD-10-CM

## 2019-06-07 DIAGNOSIS — F329 Major depressive disorder, single episode, unspecified: Secondary | ICD-10-CM | POA: Diagnosis not present

## 2019-06-07 HISTORY — DX: Depression, unspecified: F32.A

## 2019-06-07 HISTORY — PX: LAPAROSCOPIC BILATERAL SALPINGECTOMY: SHX5889

## 2019-06-07 HISTORY — DX: Anxiety disorder, unspecified: F41.9

## 2019-06-07 LAB — POCT PREGNANCY, URINE: Preg Test, Ur: NEGATIVE

## 2019-06-07 SURGERY — SALPINGECTOMY, BILATERAL, LAPAROSCOPIC
Anesthesia: General | Site: Abdomen | Laterality: Bilateral

## 2019-06-07 MED ORDER — FENTANYL CITRATE (PF) 100 MCG/2ML IJ SOLN: 50.0000 ug | INTRAMUSCULAR | Status: DC | PRN

## 2019-06-07 MED ORDER — LACTATED RINGERS IV SOLN: INTRAVENOUS | Status: DC

## 2019-06-07 MED ORDER — IBUPROFEN 600 MG PO TABS: 600.0000 mg | ORAL_TABLET | Freq: Four times a day (QID) | ORAL | 1 refills | Status: DC | PRN

## 2019-06-07 MED ORDER — PROPOFOL 10 MG/ML IV BOLUS
INTRAVENOUS | Status: DC | PRN
  Administered 2019-06-07: 200 mg via INTRAVENOUS

## 2019-06-07 MED ORDER — BUPIVACAINE HCL (PF) 0.5 % IJ SOLN
INTRAMUSCULAR | Status: AC
  Filled 2019-06-07: qty 30

## 2019-06-07 MED ORDER — FENTANYL CITRATE (PF) 100 MCG/2ML IJ SOLN
INTRAMUSCULAR | Status: DC | PRN
  Administered 2019-06-07: 100 ug via INTRAVENOUS
  Administered 2019-06-07: 50 ug via INTRAVENOUS

## 2019-06-07 MED ORDER — LIDOCAINE 2% (20 MG/ML) 5 ML SYRINGE
INTRAMUSCULAR | Status: DC | PRN
  Administered 2019-06-07: 100 mg via INTRAVENOUS

## 2019-06-07 MED ORDER — ONDANSETRON HCL 4 MG/2ML IJ SOLN
INTRAMUSCULAR | Status: AC
  Filled 2019-06-07: qty 2

## 2019-06-07 MED ORDER — BUPIVACAINE HCL (PF) 0.5 % IJ SOLN
INTRAMUSCULAR | Status: DC | PRN
  Administered 2019-06-07: 20 mL
  Administered 2019-06-07: 10 mL

## 2019-06-07 MED ORDER — FENTANYL CITRATE (PF) 100 MCG/2ML IJ SOLN
INTRAMUSCULAR | Status: AC
  Filled 2019-06-07: qty 2

## 2019-06-07 MED ORDER — ONDANSETRON HCL 4 MG/2ML IJ SOLN
INTRAMUSCULAR | Status: DC | PRN
  Administered 2019-06-07: 4 mg via INTRAVENOUS

## 2019-06-07 MED ORDER — MIDAZOLAM HCL 2 MG/2ML IJ SOLN
INTRAMUSCULAR | Status: AC
  Filled 2019-06-07: qty 2

## 2019-06-07 MED ORDER — ROCURONIUM BROMIDE 10 MG/ML (PF) SYRINGE
PREFILLED_SYRINGE | INTRAVENOUS | Status: AC
  Filled 2019-06-07: qty 10

## 2019-06-07 MED ORDER — LIDOCAINE 2% (20 MG/ML) 5 ML SYRINGE
INTRAMUSCULAR | Status: AC
  Filled 2019-06-07: qty 5

## 2019-06-07 MED ORDER — DEXAMETHASONE SODIUM PHOSPHATE 10 MG/ML IJ SOLN
INTRAMUSCULAR | Status: DC | PRN
  Administered 2019-06-07: 10 mg via INTRAVENOUS

## 2019-06-07 MED ORDER — MIDAZOLAM HCL 2 MG/2ML IJ SOLN
INTRAMUSCULAR | Status: DC | PRN
  Administered 2019-06-07: 2 mg via INTRAVENOUS

## 2019-06-07 MED ORDER — SUGAMMADEX SODIUM 500 MG/5ML IV SOLN
INTRAVENOUS | Status: DC | PRN
  Administered 2019-06-07: 500 mg via INTRAVENOUS

## 2019-06-07 MED ORDER — OXYCODONE-ACETAMINOPHEN 5-325 MG PO TABS: 1.0000 | ORAL_TABLET | Freq: Four times a day (QID) | ORAL | 0 refills | Status: DC | PRN

## 2019-06-07 MED ORDER — ROCURONIUM BROMIDE 50 MG/5ML IV SOSY
PREFILLED_SYRINGE | INTRAVENOUS | Status: DC | PRN
  Administered 2019-06-07: 50 mg via INTRAVENOUS

## 2019-06-07 MED ORDER — KETOROLAC TROMETHAMINE 30 MG/ML IJ SOLN
INTRAMUSCULAR | Status: DC | PRN
  Administered 2019-06-07: 30 mg via INTRAVENOUS

## 2019-06-07 SURGICAL SUPPLY — 31 items
BNDG ADH 1X3 SHEER STRL LF (GAUZE/BANDAGES/DRESSINGS) ×6 IMPLANT
DRSG OPSITE POSTOP 3X4 (GAUZE/BANDAGES/DRESSINGS) IMPLANT
DURAPREP 26ML APPLICATOR (WOUND CARE) ×2 IMPLANT
GAUZE 4X4 16PLY RFD (DISPOSABLE) ×2 IMPLANT
GLOVE BIO SURGEON STRL SZ 6.5 (GLOVE) ×2 IMPLANT
GLOVE BIOGEL PI IND STRL 7.0 (GLOVE) ×2 IMPLANT
GLOVE BIOGEL PI INDICATOR 7.0 (GLOVE) ×2
GOWN STRL REUS W/ TWL XL LVL3 (GOWN DISPOSABLE) ×1 IMPLANT
GOWN STRL REUS W/TWL LRG LVL3 (GOWN DISPOSABLE) ×2 IMPLANT
GOWN STRL REUS W/TWL XL LVL3 (GOWN DISPOSABLE) ×1
NEEDLE INSUFFLATION 120MM (ENDOMECHANICALS) ×2 IMPLANT
NS IRRIG 1000ML POUR BTL (IV SOLUTION) ×2 IMPLANT
PACK LAPAROSCOPY BASIN (CUSTOM PROCEDURE TRAY) ×2 IMPLANT
PACK TRENDGUARD 450 HYBRID PRO (MISCELLANEOUS) IMPLANT
PACK TRENDGUARD 600 HYBRD PROC (MISCELLANEOUS) IMPLANT
PAD OB MATERNITY 4.3X12.25 (PERSONAL CARE ITEMS) ×2 IMPLANT
PAD PREP 24X48 CUFFED NSTRL (MISCELLANEOUS) ×2 IMPLANT
SET TUBE SMOKE EVAC HIGH FLOW (TUBING) ×2 IMPLANT
SHEARS HARMONIC ACE PLUS 36CM (ENDOMECHANICALS) ×2 IMPLANT
SLEEVE SCD COMPRESS KNEE MED (MISCELLANEOUS) ×4 IMPLANT
SLEEVE XCEL OPT CAN 5 100 (ENDOMECHANICALS) ×2 IMPLANT
SUT VICRYL 0 UR6 27IN ABS (SUTURE) IMPLANT
SUT VICRYL 4-0 PS2 18IN ABS (SUTURE) IMPLANT
SYR 30ML LL (SYRINGE) IMPLANT
TOWEL GREEN STERILE FF (TOWEL DISPOSABLE) ×4 IMPLANT
TRENDGUARD 450 HYBRID PRO PACK (MISCELLANEOUS)
TRENDGUARD 600 HYBRID PROC PK (MISCELLANEOUS)
TROCAR 5M 150ML BLDLS (TROCAR) ×2 IMPLANT
TROCAR OPTI TIP 5M 100M (ENDOMECHANICALS) ×2 IMPLANT
TROCAR XCEL DIL TIP R 11M (ENDOMECHANICALS) IMPLANT
WARMER LAPAROSCOPE (MISCELLANEOUS) ×2 IMPLANT

## 2019-06-07 NOTE — Anesthesia Preprocedure Evaluation (Signed)
Anesthesia Evaluation  Patient identified by MRN, date of birth, ID band Patient awake    Reviewed: Allergy & Precautions, NPO status , Patient's Chart, lab work & pertinent test results  Airway Mallampati: II  TM Distance: >3 FB Neck ROM: Full    Dental no notable dental hx. (+) Dental Advisory Given   Pulmonary neg pulmonary ROS,    Pulmonary exam normal breath sounds clear to auscultation       Cardiovascular negative cardio ROS Normal cardiovascular exam Rhythm:Regular Rate:Normal     Neuro/Psych  Headaches, PSYCHIATRIC DISORDERS Anxiety Depression    GI/Hepatic negative GI ROS, Neg liver ROS,   Endo/Other  Morbid obesity  Renal/GU negative Renal ROS     Musculoskeletal negative musculoskeletal ROS (+)   Abdominal   Peds  Hematology negative hematology ROS (+)   Anesthesia Other Findings   Reproductive/Obstetrics negative OB ROS                             Anesthesia Physical Anesthesia Plan  ASA: II  Anesthesia Plan: General   Post-op Pain Management:    Induction: Intravenous  PONV Risk Score and Plan: 4 or greater and Ondansetron, Treatment may vary due to age or medical condition, Dexamethasone, Scopolamine patch - Pre-op and Midazolam  Airway Management Planned: LMA  Additional Equipment: None  Intra-op Plan:   Post-operative Plan: Extubation in OR  Informed Consent: I have reviewed the patients History and Physical, chart, labs and discussed the procedure including the risks, benefits and alternatives for the proposed anesthesia with the patient or authorized representative who has indicated his/her understanding and acceptance.     Dental advisory given  Plan Discussed with: CRNA  Anesthesia Plan Comments:         Anesthesia Quick Evaluation

## 2019-06-07 NOTE — Discharge Instructions (Signed)
Laparoscopic Tubal Ligation, Care After This sheet gives you information about how to care for yourself after your procedure. Your health care provider may also give you more specific instructions. If you have problems or questions, contact your health care provider. What can I expect after the procedure? After the procedure, it is common to have:  A sore throat.  Discomfort in your shoulder.  Mild discomfort or cramping in your abdomen.  Gas pains.  Pain or soreness in the area where the surgical incision was made.  A bloated feeling.  Tiredness.  Nausea.  Vomiting. Follow these instructions at home: Medicines  Take over-the-counter and prescription medicines only as told by your health care provider.  Do not take aspirin because it can cause bleeding.  Ask your health care provider if the medicine prescribed to you: ? Requires you to avoid driving or using heavy machinery. ? Can cause constipation. You may need to take actions to prevent or treat constipation, such as:  Drink enough fluid to keep your urine pale yellow.  Take over-the-counter or prescription medicines.  Eat foods that are high in fiber, such as beans, whole grains, and fresh fruits and vegetables.  Limit foods that are high in fat and processed sugars, such as fried or sweet foods. Incision care      Follow instructions from your health care provider about how to take care of your incision. Make sure you: ? Wash your hands with soap and water before and after you change your bandage (dressing). If soap and water are not available, use hand sanitizer. ? Change your dressing as told by your health care provider. ? Leave stitches (sutures), skin glue, or adhesive strips in place. These skin closures may need to stay in place for 2 weeks or longer. If adhesive strip edges start to loosen and curl up, you may trim the loose edges. Do not remove adhesive strips completely unless your health care provider  tells you to do that.  Check your incision area every day for signs of infection. Check for: ? Redness, swelling, or pain. ? Fluid or blood. ? Warmth. ? Pus or a bad smell. Activity  Rest as told by your health care provider.  Avoid sitting for a long time without moving. Get up to take short walks every 1-2 hours. This is important to improve blood flow and breathing. Ask for help if you feel weak or unsteady.  Return to your normal activities as told by your health care provider. Ask your health care provider what activities are safe for you. General instructions  Do not take baths, swim, or use a hot tub until your health care provider approves. Ask your health care provider if you may take showers. You may only be allowed to take sponge baths.  Have someone help you with your daily household tasks for the first few days.  Keep all follow-up visits as told by your health care provider. This is important. Contact a health care provider if:  You have redness, swelling, or pain around your incision.  Your incision feels warm to the touch.  You have pus or a bad smell coming from your incision.  The edges of your incision break open after the sutures have been removed.  Your pain does not improve after 2-3 days.  You have a rash.  You repeatedly become dizzy or light-headed.  Your pain medicine is not helping. Get help right away if you:  Have a fever.  Faint.  Have increasing  pain in your abdomen.  Have severe pain in one or both of your shoulders.  Have fluid or blood coming from your sutures or from your vagina.  Have shortness of breath or difficulty breathing.  Have chest pain or leg pain.  Have ongoing nausea, vomiting, or diarrhea. Summary  After the procedure, it is common to have mild discomfort or cramping in your abdomen.  Take over-the-counter and prescription medicines only as told by your health care provider.  Watch for symptoms that should  prompt you to call your health care provider.  Keep all follow-up visits as told by your health care provider. This is important. This information is not intended to replace advice given to you by your health care provider. Make sure you discuss any questions you have with your health care provider. Document Revised: 09/13/2018 Document Reviewed: 03/01/2018 Elsevier Patient Education  [place] Instructions  Activity: Get plenty of rest for the remainder of the day. A responsible individual must stay with you for 24 hours following the procedure.  For the next 24 hours, DO NOT: -Drive a car -Paediatric nurse -Drink alcoholic beverages -Take any medication unless instructed by your physician -Make any legal decisions or sign important papers.  Meals: Start with liquid foods such as gelatin or soup. Progress to regular foods as tolerated. Avoid greasy, spicy, heavy foods. If nausea and/or vomiting occur, drink only clear liquids until the nausea and/or vomiting subsides. Call your physician if vomiting continues.  Special Instructions/Symptoms: Your throat may feel dry or sore from the anesthesia or the breathing tube placed in your throat during surgery. If this causes discomfort, gargle with warm salt water. The discomfort should disappear within 24 hours.  If you had a scopolamine patch placed behind your ear for the management of post- operative nausea and/or vomiting:  1. The medication in the patch is effective for 72 hours, after which it should be removed.  Wrap patch in a tissue and discard in the trash. Wash hands thoroughly with soap and water. 2. You may remove the patch earlier than 72 hours if you experience unpleasant side effects which may include dry mouth, dizziness or visual disturbances. 3. Avoid touching the patch. Wash your hands with soap and water after contact with the patch.    Next dose of Ibuprofen can be taken at 9pm  tonight if needed

## 2019-06-07 NOTE — Op Note (Signed)
06/07/2019  3:01 PM  PATIENT:  Janet Clayton  34 y.o. female  PRE-OPERATIVE DIAGNOSIS:  Undesired Fertility  POST-OPERATIVE DIAGNOSIS:  UNDESIRED FERTILITY  PROCEDURE:  Procedure(s): LAPAROSCOPIC BILATERAL SALPINGECTOMY (Bilateral)  SURGEON:  Surgeon(s) and Role:    * Sara Keys, Wilhemina Cash, MD - Primary  ANESTHESIA:   local and general  EBL:  minimal   BLOOD ADMINISTERED:none  DRAINS: none   LOCAL MEDICATIONS USED:  MARCAINE     SPECIMEN:  Source of Specimen:  both oviducts  DISPOSITION OF SPECIMEN:  PATHOLOGY  COUNTS:  YES  TOURNIQUET:  * No tourniquets in log *  DICTATION: .Dragon Dictation  PLAN OF CARE: Discharge to home after PACU  PATIENT DISPOSITION:  PACU - hemodynamically stable.   Delay start of Pharmacological VTE agent (>24hrs) due to surgical blood loss or risk of bleeding: not applicable The risks, benefits, and alternatives of surgery were explained, understood, accepted. She is certain that she wants permanent sterility. She understands that this is not reversible. She understands there is a small failure rate of this procedure. She also would like to have both oviducts removed her due newest recommendations to help prevent ovarian/peritoneal cancer. In the operating room she was placed in the dorsal lithotomy position, and general anesthesia was given without complication. Her abdomen and vagina were prepped and draped in the usual sterile fashion. A timeout procedure was done. A bimanual exam revealed a small anteverted and mobile uterus. Her adnexa felt normal. A Hulka manipulator was placed. Her bladder was emptied with a Robinson catheter. Gloves were changed, and attention was turned to the abdomen. Approximately the 10 mL of 0.5% Marcaine was injected into the umbilicus. A vertical incision was made at the site. A varies needle was placed intraperitoneally. Low-flow CO2 was used to insufflate the abdomen to approximately 3-1/2 L. Once a good pneumoperitoneum was  established, a 5 mm Excel trocar was placed. Laparoscopy confirmed correct placement. A 5 mm port was placed in the left lower quadrant and a 5 mm port in the right lower quadrant under direct laparoscopic visualization after injecting 0.5% Marcaine in the incision sites. Her pelvis and upper abdomen appeared normal. A Harmonic scapel was used to hemostatically removed both oviducts. I removed the oviducts through the port. The CO2 was allowed to escape from the abdomen. I removed the ports and noted hemostasis.  A subcuticular closure was done with 4-0 Vicryl suture at all incision sites. A Steri-Strip was placed across each incision. She was extubated and taken to the recovery room in stable condition.

## 2019-06-07 NOTE — Anesthesia Procedure Notes (Signed)
Procedure Name: Intubation Date/Time: 06/07/2019 2:17 PM Performed by: Genelle Bal, CRNA Pre-anesthesia Checklist: Patient identified, Emergency Drugs available, Suction available and Patient being monitored Patient Re-evaluated:Patient Re-evaluated prior to induction Oxygen Delivery Method: Circle system utilized Preoxygenation: Pre-oxygenation with 100% oxygen Induction Type: IV induction Ventilation: Mask ventilation without difficulty Laryngoscope Size: Miller and 2 Grade View: Grade I Tube type: Oral Tube size: 7.0 mm Number of attempts: 1 Airway Equipment and Method: Stylet and Oral airway Placement Confirmation: ETT inserted through vocal cords under direct vision,  positive ETCO2 and breath sounds checked- equal and bilateral Secured at: 21 cm Tube secured with: Tape Dental Injury: Teeth and Oropharynx as per pre-operative assessment

## 2019-06-07 NOTE — H&P (Signed)
Janet Clayton is an 34 y.o. single G0 here to have a bilateral salpingectomy due to her complete certainty that she never wants to have a biological child. She states that she is abstinent due to her fear of getting pregnant. She declines alternative forms of contraception. She wants the tubes removed versus ligated due to the latest recommendation that this will decrease her risk of ovarian cancer.   Patient's last menstrual period was 05/08/2019 (exact date).    Past Medical History:  Diagnosis Date  . Anxiety   . Depression   . Vaginal Pap smear, abnormal     History reviewed. No pertinent surgical history.  Family History  Problem Relation Age of Onset  . Alzheimer's disease Mother     Social History:  reports that she has never smoked. She has never used smokeless tobacco. She reports current alcohol use. She reports that she does not use drugs.  Allergies:  Allergies  Allergen Reactions  . Sulfamethoxazole-Trimethoprim Nausea And Vomiting  . Amoxicillin-Pot Clavulanate Nausea Only, Rash and Nausea And Vomiting  . Codeine Rash and Nausea And Vomiting  . Doxycycline Dermatitis, Hives, Itching, Rash and Swelling  . Sulfa Antibiotics Rash and Nausea And Vomiting    Medications Prior to Admission  Medication Sig Dispense Refill Last Dose  . clonazePAM (KLONOPIN) 0.5 MG tablet Take 1 tablet (0.5 mg total) by mouth 2 (two) times daily. 20 tablet 0 Past Month at Unknown time  . DULoxetine (CYMBALTA) 20 MG capsule Take 2 capsules (40 mg total) by mouth daily. 60 capsule 3 06/07/2019 at Unknown time  . ibuprofen (ADVIL) 400 MG tablet Take 400 mg by mouth every 6 (six) hours as needed.   Past Week at Unknown time  . SUMAtriptan (IMITREX) 50 MG tablet TAKE 1 TABLET BY MOUTH EVERY 2 HOURS AS NEEDED FOR MIGRAINE. MAY REPEAT IN 2 HOURS IF HEADACHE PERSISTS OR RECURS 30 tablet 0 Past Month at Unknown time    Review of Systems  She does admin work.  Blood pressure 124/83, pulse 94,  temperature 98.6 F (37 C), temperature source Oral, resp. rate 18, height 5\' 6"  (1.676 m), weight 120.3 kg, last menstrual period 05/08/2019, SpO2 98 %. Physical Exam  Breathing, conversing, and ambulating normally Well nourished, well hydrated White female, no apparent distress Heart- rrr Lungs- CTAB Abd- benign  Results for orders placed or performed during the hospital encounter of 06/07/19 (from the past 24 hour(s))  Pregnancy, urine POC     Status: None   Collection Time: 06/07/19 12:14 PM  Result Value Ref Range   Preg Test, Ur NEGATIVE NEGATIVE    No results found.  Assessment/Plan: Unwanted fertility Plan for laparoscopic bilateral salpingectomy. She understands that this is permanent.  She understands the risks of surgery, including, but not to infection, bleeding, DVTs, damage to bowel, bladder, ureters. She wishes to proceed.     Emily Filbert 06/07/2019, 2:01 PM

## 2019-06-08 ENCOUNTER — Encounter: Payer: Self-pay | Admitting: *Deleted

## 2019-06-08 LAB — SURGICAL PATHOLOGY

## 2019-06-08 NOTE — Transfer of Care (Signed)
Immediate Anesthesia Transfer of Care Note  Patient: Janet Clayton  Procedure(s) Performed: LAPAROSCOPIC BILATERAL SALPINGECTOMY (Bilateral Abdomen)  Patient Location: PACU  Anesthesia Type:General  Level of Consciousness: awake, alert  and oriented  Airway & Oxygen Therapy: Patient Spontanous Breathing and Patient connected to face mask oxygen  Post-op Assessment: Report given to RN and Post -op Vital signs reviewed and stable  Post vital signs: Reviewed and stable  Last Vitals:  Vitals Value Taken Time  BP 118/71 06/07/19 1630  Temp 36.8 C 06/07/19 1515  Pulse 96 06/07/19 1638  Resp 20 06/07/19 1613  SpO2 100 % 06/07/19 1638  Vitals shown include unvalidated device data.  Last Pain:  Vitals:   06/07/19 1640  TempSrc:   PainSc: 0-No pain      Patients Stated Pain Goal: 3 ([id number])  Complications: No apparent anesthesia complications

## 2019-06-08 NOTE — Anesthesia Postprocedure Evaluation (Signed)
Anesthesia Post Note  Patient: Janet Clayton  Procedure(s) Performed: LAPAROSCOPIC BILATERAL SALPINGECTOMY (Bilateral Abdomen)     Patient location during evaluation: PACU Anesthesia Type: General Level of consciousness: sedated and patient cooperative Pain management: pain level controlled Vital Signs Assessment: post-procedure vital signs reviewed and stable Respiratory status: spontaneous breathing Cardiovascular status: stable Anesthetic complications: no    Last Vitals:  Vitals:   06/07/19 1530 06/07/19 1600  BP: 126/76 126/80  Pulse: 92 85  Resp: 14 (!) 22  Temp:    SpO2: 96% 100%    Last Pain:  Vitals:   06/07/19 1640  TempSrc:   PainSc: 0-No pain                 Nolon Nations

## 2019-06-26 ENCOUNTER — Other Ambulatory Visit: Payer: Self-pay | Admitting: Physician Assistant

## 2019-06-26 DIAGNOSIS — F4323 Adjustment disorder with mixed anxiety and depressed mood: Secondary | ICD-10-CM

## 2019-06-26 NOTE — Telephone Encounter (Signed)
Last filled 07/19/2018 #20 with no refills.  Last appt 11/16/2018.  Patient needs appt.  Janett Billow - please call for appt.

## 2019-06-26 NOTE — Telephone Encounter (Signed)
Appt 07/04/2018. Please advise.

## 2019-06-26 NOTE — Telephone Encounter (Signed)
Appointment has been made. No further questions at this time.  

## 2019-07-04 ENCOUNTER — Ambulatory Visit (INDEPENDENT_AMBULATORY_CARE_PROVIDER_SITE_OTHER): Payer: No Typology Code available for payment source | Admitting: Physician Assistant

## 2019-07-04 ENCOUNTER — Encounter: Payer: Self-pay | Admitting: Physician Assistant

## 2019-07-04 VITALS — BP 136/79 | HR 93 | Ht 66.0 in | Wt 266.0 lb

## 2019-07-04 DIAGNOSIS — E782 Mixed hyperlipidemia: Secondary | ICD-10-CM

## 2019-07-04 DIAGNOSIS — Z79899 Other long term (current) drug therapy: Secondary | ICD-10-CM | POA: Diagnosis not present

## 2019-07-04 DIAGNOSIS — R7301 Impaired fasting glucose: Secondary | ICD-10-CM | POA: Diagnosis not present

## 2019-07-04 DIAGNOSIS — F411 Generalized anxiety disorder: Secondary | ICD-10-CM

## 2019-07-04 DIAGNOSIS — F329 Major depressive disorder, single episode, unspecified: Secondary | ICD-10-CM | POA: Insufficient documentation

## 2019-07-04 DIAGNOSIS — F419 Anxiety disorder, unspecified: Secondary | ICD-10-CM | POA: Insufficient documentation

## 2019-07-04 DIAGNOSIS — G43009 Migraine without aura, not intractable, without status migrainosus: Secondary | ICD-10-CM

## 2019-07-04 DIAGNOSIS — F330 Major depressive disorder, recurrent, mild: Secondary | ICD-10-CM

## 2019-07-04 DIAGNOSIS — F32A Depression, unspecified: Secondary | ICD-10-CM | POA: Insufficient documentation

## 2019-07-04 DIAGNOSIS — N94819 Vulvodynia, unspecified: Secondary | ICD-10-CM

## 2019-07-04 MED ORDER — DULOXETINE HCL 20 MG PO CPEP: 40.0000 mg | ORAL_CAPSULE | Freq: Every day | ORAL | 3 refills | Status: DC

## 2019-07-04 MED ORDER — SUMATRIPTAN SUCCINATE 50 MG PO TABS: ORAL_TABLET | ORAL | 5 refills | Status: DC

## 2019-07-04 MED ORDER — BUSPIRONE HCL 5 MG PO TABS: 5.0000 mg | ORAL_TABLET | Freq: Three times a day (TID) | ORAL | 0 refills | Status: DC

## 2019-07-05 ENCOUNTER — Encounter: Payer: Self-pay | Admitting: Physician Assistant

## 2019-07-05 DIAGNOSIS — F330 Major depressive disorder, recurrent, mild: Secondary | ICD-10-CM | POA: Insufficient documentation

## 2019-07-05 NOTE — Progress Notes (Signed)
Subjective:    Patient ID: Janet Clayton, female    DOB: 12-24-1985, 34 y.o.   MRN: [id number]  HPI Pt is a obese female with anxiety, depression who presents to the clinic for medication refill. She was switched off zoloft to cymbalta to help treat her vulvodynia pain. It has worked great for pain but seems to keep her more on edge than se was on zoloft. No SI/HC. She is motivated and has energy. She feels like her focus is worse and her daily anxiety is more. She rarely takes clonazepam.   Having about 2-3 migraines a month but imitrex works great for rescue.   .. Active Ambulatory Problems    Diagnosis Date Noted  . Epidermal cyst 06/18/2014  . Obesity 06/18/2014  . Routine physical examination 06/18/2014  . Dermatofibroma 06/19/2014  . Hyperlipidemia 06/22/2014  . Adjustment reaction with anxiety and depression 12/15/2016  . Sleep disturbances 12/15/2016  . Family history of Alzheimer's disease 12/15/2016  . GAD (generalized anxiety disorder) 01/25/2017  . Migraine 12/28/2017  . Vertigo 12/28/2017  . Inattention 02/18/2018  . Stress at work 02/18/2018  . Genital warts 05/24/2018  . Dysplasia of cervix, low grade (CIN 1) 05/25/2018  . HSV-2 (herpes simplex virus 2) infection 05/30/2018  . 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 Elevated blood pressure reading 11/16/2018  . Morbidly obese ([place]) 11/16/2018  . Anxiety and depression 07/04/2019  . Mild episode of recurrent major depressive disorder ([place]) 07/05/2019   Resolved Ambulatory Problems    Diagnosis Date Noted  . No Resolved Ambulatory Problems   Past Medical History:  Diagnosis Date  . Anxiety   . Depression   . Vaginal Pap smear, abnormal      Review of Systems  All other systems reviewed and are negative.      Objective:   Physical Exam Vitals reviewed.  Constitutional:      Appearance: Normal appearance. She is obese.  Cardiovascular:     Rate and Rhythm: Normal rate and regular rhythm.     Pulses: Normal  pulses.  Pulmonary:     Effort: Pulmonary effort is normal.     Breath sounds: Normal breath sounds.  Neurological:     Mental Status: She is alert and oriented to person, place, and time.  Psychiatric:        Mood and Affect: Mood normal.    .. Depression screen [facility] 2/9 07/04/2019 01/30/2019 01/30/2019 11/16/2018 06/20/2018  Decreased Interest 0 0 0 0 0  Down, Depressed, Hopeless 1 1 1 1 1   PHQ - 2 Score 1 1 1 1 1   Altered sleeping 0 0 - 0 0  Tired, decreased energy 1 1 - 1 1  Change in appetite 0 0 - 0 0  Feeling bad or failure about yourself  0 0 - 0 0  Trouble concentrating 2 1 - 1 1  Moving slowly or fidgety/restless 1 0 - 0 0  Suicidal thoughts 0 0 - 0 0  PHQ-9 Score 5 3 - 3 3  Difficult doing work/chores Somewhat difficult Somewhat difficult - Somewhat difficult Not difficult at all   .Marland Kitchen GAD 7 : Generalized Anxiety Score 07/04/2019 01/30/2019 11/16/2018 06/20/2018  Nervous, Anxious, on Edge 2 1 1 1   Control/stop worrying 1 0 0 1  Worry too much - different things 1 1 1 1   Trouble relaxing 0 1 1 0  Restless 0 0 0 1  Easily annoyed or irritable 0 0 0 2  Afraid - awful might happen  0 1 1 2   Total GAD 7 Score 4 4 4 8   Anxiety Difficulty Somewhat difficult Not difficult at all Somewhat difficult Not difficult at all           Assessment & Plan:  Marland KitchenMarland KitchenJacenta was seen today for follow-up.  Diagnoses and all orders for this visit:  GAD (generalized anxiety disorder) -     busPIRone (BUSPAR) 5 MG tablet; Take 1 tablet (5 mg total) by mouth 3 (three) times daily.  Morbidly obese (HCC)  Elevated fasting glucose -     Hemoglobin A1c  Medication management -     COMPLETE METABOLIC PANEL WITH GFR  Mixed hyperlipidemia -     Lipid Panel w/reflex Direct LDL  Vulvodynia -     DULoxetine (CYMBALTA) 20 MG capsule; Take 2 capsules (40 mg total) by mouth daily.  Mild episode of recurrent major depressive disorder (HCC) -     DULoxetine (CYMBALTA) 20 MG capsule; Take 2  capsules (40 mg total) by mouth daily.  Migraine without aura and without status migrainosus, not intractable -     SUMAtriptan (IMITREX) 50 MG tablet; TAKE 1 TABLET BY MOUTH EVERY 2 HOURS AS NEEDED FOR MIGRAINE. MAY REPEAT IN 2 HOURS IF HEADACHE PERSISTS OR RECURS   Needs fasting labs.  Refilled medication.  Continue cymbalta.  Added buspar for anxiety. Can use up to 3 times a day start with twice a day.  Discussed counseling, exercise, meditation.   Marland Kitchen.Discussed low carb diet with 1500 calories and 80g of protein.  Exercising at least 150 minutes a week.  [employer] could be a Microbiologist.  Discussed IF 16:8. HO given.   Follow up in 3 months.

## 2019-07-17 ENCOUNTER — Encounter: Payer: Self-pay | Admitting: Obstetrics & Gynecology

## 2019-07-17 ENCOUNTER — Ambulatory Visit (INDEPENDENT_AMBULATORY_CARE_PROVIDER_SITE_OTHER): Payer: No Typology Code available for payment source | Admitting: Obstetrics & Gynecology

## 2019-07-17 ENCOUNTER — Other Ambulatory Visit: Payer: Self-pay

## 2019-07-17 VITALS — BP 124/84 | HR 83 | Temp 98.3°F | Resp 16 | Ht 66.0 in | Wt 266.0 lb

## 2019-07-17 DIAGNOSIS — Z9889 Other specified postprocedural states: Secondary | ICD-10-CM

## 2019-07-17 DIAGNOSIS — N870 Mild cervical dysplasia: Secondary | ICD-10-CM | POA: Insufficient documentation

## 2019-07-17 MED ORDER — METRONIDAZOLE 500 MG PO TABS: 500.0000 mg | ORAL_TABLET | Freq: Two times a day (BID) | ORAL | 3 refills | Status: DC

## 2019-07-17 NOTE — Progress Notes (Signed)
   Subjective:    Patient ID: Janet Clayton, female    DOB: 07-27-1985, 34 y.o.   MRN: [id number]  HPI 34 yo single G0 here for a post op visit. She had a bilateral salpingectomy for unwanted fertility. She has no complaints. She only had to take a single IBU 800mg  post operatively. She has not had sex since surgery and has had a normal period.   Review of Systems     Objective:   Physical Exam Breathing, conversing, and ambulating normally Well nourished, well hydrated White female, no apparent distress Abd- benign Incisions- healed well     Assessment & Plan:  Post op state- doing well H/o CIN 1 on colpo pathology 6/20- plan for repeat pap 7/21

## 2019-11-06 ENCOUNTER — Telehealth: Payer: Self-pay | Admitting: *Deleted

## 2019-11-06 NOTE — Telephone Encounter (Signed)
Returned call from 11:00 AM. Left patient a message with appointment information and to call and reschedule if needed.

## 2019-11-13 ENCOUNTER — Telehealth: Payer: Self-pay | Admitting: *Deleted

## 2019-11-13 NOTE — Telephone Encounter (Signed)
Returned call from 1:56 PM. Left urgent message for patient to call and reschedule appointment on 12/04/2019. Patient wants to see Dr. Gala Romney only.

## 2019-11-13 NOTE — Telephone Encounter (Signed)
Left patient an urgent message that provider has changed for appointment on 12/04/2019 at 8:15 AM with an 8:00 AM arrival time.

## 2019-11-29 ENCOUNTER — Emergency Department (INDEPENDENT_AMBULATORY_CARE_PROVIDER_SITE_OTHER)
Admission: RE | Admit: 2019-11-29 | Discharge: 2019-11-29 | Disposition: A | Discharge status: Home / Self Care | Payer: 59 | Source: Ambulatory Visit | Attending: Family Medicine | Admitting: Family Medicine

## 2019-11-29 ENCOUNTER — Other Ambulatory Visit: Payer: Self-pay

## 2019-11-29 VITALS — BP 137/86 | HR 90 | Temp 98.7°F | Resp 16 | Ht 66.0 in | Wt 255.0 lb

## 2019-11-29 DIAGNOSIS — R059 Cough, unspecified: Secondary | ICD-10-CM

## 2019-11-29 DIAGNOSIS — R05 Cough: Secondary | ICD-10-CM | POA: Diagnosis not present

## 2019-11-29 DIAGNOSIS — J069 Acute upper respiratory infection, unspecified: Secondary | ICD-10-CM

## 2019-11-29 NOTE — Discharge Instructions (Addendum)
Take plain guaifenesin (1200mg  extended release tabs such as Mucinex) twice daily, with plenty of water, for cough and congestion.  May add Pseudoephedrine (30mg , one or two every 4 to 6 hours) for sinus congestion.  Get adequate rest.   May use Afrin nasal spray (or generic oxymetazoline) each morning for about 5 days and then discontinue.  Also recommend using saline nasal spray several times daily and saline nasal irrigation (AYR is a common brand).  Use Flonase nasal spray each morning after using Afrin nasal spray and saline nasal irrigation. Try warm salt water gargles for sore throat.  Stop all antihistamines for now, and other non-prescription cough/cold preparations. May take Delsym Cough Suppressant ("12 Hour Cough Relief") at bedtime for nighttime cough.   Isolate yourself until COVID-19 test result is available.   If your COVID19 test is positive, then you are infected with the novel coronavirus and could give the virus to others.  Please continue isolation at home for at least 10 days since the start of your symptoms.  Once you complete your 10 day quarantine, you may return to normal activities as long as you've not had a fever for over 24 hours (without taking fever reducing medicine) and your symptoms are improving. Please continue good preventive care measures, including:  frequent hand-washing, avoid touching your face, cover coughs/sneezes, stay out of crowds and keep a 6 foot distance from others.  Go to the nearest hospital emergency room if fever/cough/breathlessness are severe or illness seems like a threat to life.

## 2019-11-29 NOTE — ED Provider Notes (Signed)
[name]    CSN: [id number] Arrival date & time: 11/29/19  1050      History   Chief Complaint Chief Complaint  Patient presents with  . Cough    appt     HPI Janet Clayton is a 34 y.o. female.   Five days ago patient developed fatigue, headache, and mild myalgias.  During the past two days she has developed nasal congestion and sneezing.  Last night she developed a cough.   She denies chest tightness, shortness of breath, and changes in taste/smell.  She has not had a fever.    The history is provided by the patient.    Past Medical History:  Diagnosis Date  . Anxiety   . Depression   . Vaginal Pap smear, abnormal     Patient Active Problem List   Diagnosis Date Noted  . CIN I (cervical intraepithelial neoplasia I) 07/17/2019  . Mild episode of recurrent major depressive disorder ([place]) 07/05/2019  . Anxiety and depression 07/04/2019  . Elevated blood pressure reading 11/16/2018  . Morbidly obese ([place]) 11/16/2018  . 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 HSV-2 (herpes simplex virus 2) infection 05/30/2018  . Dysplasia of cervix, low grade (CIN 1) 05/25/2018  . Genital warts 05/24/2018  . Inattention 02/18/2018  . Stress at work 02/18/2018  . Migraine 12/28/2017  . Vertigo 12/28/2017  . GAD (generalized anxiety disorder) 01/25/2017  . Adjustment reaction with anxiety and depression 12/15/2016  . Sleep disturbances 12/15/2016  . Family history of Alzheimer's disease 12/15/2016  . Hyperlipidemia 06/22/2014  . Dermatofibroma 06/19/2014  . Epidermal cyst 06/18/2014  . Obesity 06/18/2014  . Routine physical examination 06/18/2014    Past Surgical History:  Procedure Laterality Date  . LAPAROSCOPIC BILATERAL SALPINGECTOMY Bilateral 06/07/2019   Procedure: LAPAROSCOPIC BILATERAL SALPINGECTOMY;  Surgeon: Emily Filbert, MD;  Location: [facility];  Service: Gynecology;  Laterality: Bilateral;    OB History    Gravida  0   Para  0   Term  0    Preterm  0   AB  0   Living  0     SAB  0   TAB  0   Ectopic  0   Multiple  0   Live Births  0            Home Medications    Prior to Admission medications   Medication Sig Start Date End Date Taking? Authorizing Provider  busPIRone (BUSPAR) 5 MG tablet Take 1 tablet (5 mg total) by mouth 3 (three) times daily. 07/04/19  Yes Breeback, Jade L, PA-C  clonazePAM (KLONOPIN) 0.5 MG tablet TAKE 1 TABLET(0.5 MG) BY MOUTH TWICE DAILY 06/26/19  Yes Breeback, Jade L, PA-C  DULoxetine (CYMBALTA) 20 MG capsule Take 2 capsules (40 mg total) by mouth daily. 07/04/19  Yes Breeback, Jade L, PA-C  ibuprofen (ADVIL) 600 MG tablet Take 1 tablet (600 mg total) by mouth every 6 (six) hours as needed. 06/07/19  Yes Dove, Myra C, MD  SUMAtriptan (IMITREX) 50 MG tablet TAKE 1 TABLET BY MOUTH EVERY 2 HOURS AS NEEDED FOR MIGRAINE. MAY REPEAT IN 2 HOURS IF HEADACHE PERSISTS OR RECURS 07/04/19  Yes Breeback, Royetta Car, PA-C    Family History Family History  Problem Relation Age of Onset  . Alzheimer's disease Mother     Social History Social History   Tobacco Use  . Smoking status: Never Smoker  . Smokeless tobacco: Never Used  Vaping Use  . Vaping  Use: Never used  Substance Use Topics  . Alcohol use: Yes    Alcohol/week: 0.0 standard drinks  . Drug use: No     Allergies   Sulfamethoxazole-trimethoprim, Amoxicillin-pot clavulanate, Codeine, Doxycycline, and Sulfa antibiotics   Review of Systems Review of Systems  + sore throat + cough + sneezing No pleuritic pain No wheezing + nasal congestion + post-nasal drainage No sinus pain/pressure No itchy/red eyes No earache No hemoptysis No SOB No fever/chills No nausea No vomiting No abdominal pain No diarrhea No urinary symptoms No skin rash + fatigue + myalgias + headache Used OTC meds (Claritin, Sudafed, etc) without relief    Physical Exam Triage Vital Signs ED Triage Vitals  Enc Vitals Group     BP 11/29/19 1146  137/86     Pulse Rate 11/29/19 1146 90     Resp 11/29/19 1146 16     Temp 11/29/19 1146 98.7 F (37.1 C)     Temp Source 11/29/19 1146 Oral     SpO2 11/29/19 1146 98 %     Weight 11/29/19 1144 255 lb (115.7 kg)     Height 11/29/19 1144 5\' 6"  (1.676 m)     Head Circumference --      Peak Flow --      Pain Score 11/29/19 1144 0     Pain Loc --      Pain Edu? --      Excl. in [place]? --    No data found.  Updated Vital Signs BP 137/86 (BP Location: Right Arm)   Pulse 90   Temp 98.7 F (37.1 C) (Oral)   Resp 16   Ht 5\' 6"  (1.676 m)   Wt 115.7 kg   LMP 11/08/2019   SpO2 98%   BMI 41.16 kg/m   Visual Acuity Right Eye Distance:   Left Eye Distance:   Bilateral Distance:    Right Eye Near:   Left Eye Near:    Bilateral Near:     Physical Exam Nursing notes and Vital Signs reviewed. Appearance:  Patient appears stated age, and in no acute distress Eyes:  Pupils are equal, round, and reactive to light and accomodation.  Extraocular movement is intact.  Conjunctivae are not inflamed  Ears:  Canals normal.  Tympanic membranes normal.  Nose:  Mildly congested turbinates.  No sinus tenderness.  Pharynx:  Normal Neck:  Supple.  Mildly enlarged lateral nodes are present, tender to palpation on the left.   Lungs:  Clear to auscultation.  Breath sounds are equal.  Moving air well. Heart:  Regular rate and rhythm without murmurs, rubs, or gallops.  Abdomen:  Nontender without masses or hepatosplenomegaly.  Bowel sounds are present.  No CVA or flank tenderness.  Extremities:  No edema.  Skin:  No rash present.   UC Treatments / Results  Labs (all labs ordered are listed, but only abnormal results are displayed) Labs Reviewed  NOVEL CORONAVIRUS, NAA    EKG   Radiology No results found.  Procedures Procedures (including critical care time)  Medications Ordered in UC Medications - No data to display  Initial Impression / Assessment and Plan / UC Course  I have reviewed  the triage vital signs and the nursing notes.  Pertinent labs & imaging results that were available during my care of the patient were reviewed by me and considered in my medical decision making (see chart for details).      Benign exam.  There is no evidence of bacterial  infection today.  Treat symptomatically for now  COVID19 PCR pending. Followup with Family Doctor if not improved in about 10 days.  Final Clinical Impressions(s) / UC Diagnoses   Final diagnoses:  Cough  Viral URI with cough     Discharge Instructions     Take plain guaifenesin (1200mg  extended release tabs such as Mucinex) twice daily, with plenty of water, for cough and congestion.  May add Pseudoephedrine (30mg , one or two every 4 to 6 hours) for sinus congestion.  Get adequate rest.   May use Afrin nasal spray (or generic oxymetazoline) each morning for about 5 days and then discontinue.  Also recommend using saline nasal spray several times daily and saline nasal irrigation (AYR is a common brand).  Use Flonase nasal spray each morning after using Afrin nasal spray and saline nasal irrigation. Try warm salt water gargles for sore throat.  Stop all antihistamines for now, and other non-prescription cough/cold preparations. May take Delsym Cough Suppressant ("12 Hour Cough Relief") at bedtime for nighttime cough.   Isolate yourself until COVID-19 test result is available.   If your COVID19 test is positive, then you are infected with the novel coronavirus and could give the virus to others.  Please continue isolation at home for at least 10 days since the start of your symptoms.  Once you complete your 10 day quarantine, you may return to normal activities as long as you've not had a fever for over 24 hours (without taking fever reducing medicine) and your symptoms are improving. Please continue good preventive care measures, including:  frequent hand-washing, avoid touching your face, cover coughs/sneezes, stay out  of crowds and keep a 6 foot distance from others.  Go to the nearest hospital emergency room if fever/cough/breathlessness are severe or illness seems like a threat to life.       ED Prescriptions    None        Kandra Nicolas, MD 12/06/19 1643

## 2019-11-29 NOTE — ED Triage Notes (Signed)
Pt c/o fatigue x Friday with sore throat and cough x 1 day. Denies fever. Covid outbreak at work, but not in her dept. Covid vaccine last dose 07/27/19.

## 2019-11-30 LAB — NOVEL CORONAVIRUS, NAA: SARS-CoV-2, NAA: NOT DETECTED

## 2019-11-30 LAB — SARS-COV-2, NAA 2 DAY TAT

## 2019-12-04 ENCOUNTER — Ambulatory Visit: Payer: No Typology Code available for payment source | Admitting: Obstetrics and Gynecology

## 2020-01-01 ENCOUNTER — Other Ambulatory Visit (HOSPITAL_COMMUNITY)
Admission: RE | Admit: 2020-01-01 | Discharge: 2020-01-01 | Disposition: A | Discharge status: Home / Self Care | Payer: 59 | Source: Ambulatory Visit | Attending: Obstetrics & Gynecology | Admitting: Obstetrics & Gynecology

## 2020-01-01 ENCOUNTER — Ambulatory Visit (INDEPENDENT_AMBULATORY_CARE_PROVIDER_SITE_OTHER): Payer: 59 | Admitting: Obstetrics & Gynecology

## 2020-01-01 ENCOUNTER — Other Ambulatory Visit: Payer: Self-pay

## 2020-01-01 ENCOUNTER — Encounter: Payer: Self-pay | Admitting: Obstetrics & Gynecology

## 2020-01-01 VITALS — BP 133/90 | HR 110 | Ht 66.0 in | Wt 272.0 lb

## 2020-01-01 DIAGNOSIS — L739 Follicular disorder, unspecified: Secondary | ICD-10-CM

## 2020-01-01 DIAGNOSIS — Z01419 Encounter for gynecological examination (general) (routine) without abnormal findings: Secondary | ICD-10-CM | POA: Insufficient documentation

## 2020-01-01 NOTE — Progress Notes (Signed)
Subjective:     Janet Clayton is a 34 y.o. female here for a routine exam.  Current complaints: rash on posterior thighs + increased folliculitis on mons pubis and areas shaved.  .   Gynecologic History Patient's last menstrual period was 12/18/2019. Contraception: tubal ligation Last Pap: 2020. Results were: CIN 1 on biopsy  Obstetric History OB History  Gravida Para Term Preterm AB Living  0 0 0 0 0 0  SAB TAB Ectopic Multiple Live Births  0 0 0 0 0     The following portions of the patient's history were reviewed and updated as appropriate: allergies, current medications, past family history, past medical history, past social history, past surgical history and problem list.  Review of Systems Pertinent items noted in HPI and remainder of comprehensive ROS otherwise negative.    Objective:     Vitals:   01/01/20 1406  BP: 133/90  Pulse: (!) 110  Weight: 272 lb (123.4 kg)  Height: 5\' 6"  (1.676 m)   Vitals:  WNL General appearance: alert, cooperative and no distress  HEENT: Normocephalic, without obvious abnormality, atraumatic Eyes: negative Throat: lips, mucosa, and tongue normal; teeth and gums normal  Respiratory: Clear to auscultation bilaterally  CV: Regular rate and rhythm  Breasts:  Normal appearance, no masses or tenderness, no nipple retraction or dimpling  GI: Soft, non-tender; bowel sounds normal; no masses,  no organomegaly  GU: External Genitalia:  Tanner V, no lesion Urethra:  No prolapse   Vagina: Pink, normal rugae, no blood or discharge  Cervix: No CMT, no lesion  Uterus:  Normal size and contour, non tender  Adnexa: Normal, no masses, non tender  Musculoskeletal: No edema, redness or tenderness in the calves or thighs  Skin: No lesions or rash  Lymphatic: Axillary adenopathy: none     Psychiatric: Normal mood and behavior      Assessment:    Healthy female exam.   History of CIN 1 on colposcopy Recurrent ingrown hairs on mons pubis and  labia majora   Plan:  1.  Pap with co-testing 2.  Offered flu shot (declined); did take Covid. 3.  OTC hydrocortisone cream for rash and referral to dermatologist for overall skin issues.   4.  Encourage clipping or laser vs waxing ans shaving for recurrent ingrown hairs.

## 2020-01-01 NOTE — Progress Notes (Signed)
First BP 154/103 Repeat BP 133/90

## 2020-01-03 LAB — CYTOLOGY - PAP
Comment: NEGATIVE
High risk HPV: POSITIVE — AB

## 2020-07-01 ENCOUNTER — Ambulatory Visit
Admission: EM | Admit: 2020-07-01 | Discharge: 2020-07-01 | Disposition: A | Discharge status: Home / Self Care | Payer: BC Managed Care – PPO

## 2020-07-01 ENCOUNTER — Ambulatory Visit: Payer: Self-pay

## 2020-07-01 ENCOUNTER — Other Ambulatory Visit: Payer: Self-pay

## 2020-07-01 DIAGNOSIS — H6981 Other specified disorders of Eustachian tube, right ear: Secondary | ICD-10-CM

## 2020-07-01 DIAGNOSIS — H6991 Unspecified Eustachian tube disorder, right ear: Secondary | ICD-10-CM

## 2020-07-01 NOTE — ED Triage Notes (Signed)
Pt c/o right ear pain, pressure for approx 1 week.  Denies sore throat, congestion, runny nose, HA, fever, n/v/d, cough. Last took tylenol and ibuprofen on Saturday.

## 2020-07-01 NOTE — ED Provider Notes (Signed)
Roderic Palau    CSN: [id number] Arrival date & time: 07/01/20  1149      History   Chief Complaint Chief Complaint  Patient presents with  . Otalgia    HPI Libi Corso is a 35 y.o. female.   Pt is a 35 year old female that presents with right ear pain, pressure for approx 1 week. Denies sore throat, congestion, runny nose, HA, fever, n/v/d, cough. Last took tylenol and ibuprofen on Saturday.   Otalgia   Past Medical History:  Diagnosis Date  . Anxiety   . Depression   . Vaginal Pap smear, abnormal     Patient Active Problem List   Diagnosis Date Noted  . CIN I (cervical intraepithelial neoplasia I) 07/17/2019  . Mild episode of recurrent major depressive disorder ([place]) 07/05/2019  . Anxiety and depression 07/04/2019  . Elevated blood pressure reading 11/16/2018  . Morbidly obese ([place]) 11/16/2018  . 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 HSV-2 (herpes simplex virus 2) infection 05/30/2018  . Dysplasia of cervix, low grade (CIN 1) 05/25/2018  . Genital warts 05/24/2018  . Inattention 02/18/2018  . Stress at work 02/18/2018  . Migraine 12/28/2017  . Vertigo 12/28/2017  . GAD (generalized anxiety disorder) 01/25/2017  . Adjustment reaction with anxiety and depression 12/15/2016  . Sleep disturbances 12/15/2016  . Family history of Alzheimer's disease 12/15/2016  . Hyperlipidemia 06/22/2014  . Dermatofibroma 06/19/2014  . Epidermal cyst 06/18/2014  . Obesity 06/18/2014  . Routine physical examination 06/18/2014    Past Surgical History:  Procedure Laterality Date  . LAPAROSCOPIC BILATERAL SALPINGECTOMY Bilateral 06/07/2019   Procedure: LAPAROSCOPIC BILATERAL SALPINGECTOMY;  Surgeon: Emily Filbert, MD;  Location: [facility];  Service: Gynecology;  Laterality: Bilateral;    OB History    Gravida  0   Para  0   Term  0   Preterm  0   AB  0   Living  0     SAB  0   IAB  0   Ectopic  0   Multiple  0   Live Births  0             Home Medications    Prior to Admission medications   Medication Sig Start Date End Date Taking? Authorizing Provider  clonazePAM (KLONOPIN) 0.5 MG tablet TAKE 1 TABLET(0.5 MG) BY MOUTH TWICE DAILY 06/26/19  Yes Breeback, Jade L, PA-C  DULoxetine (CYMBALTA) 20 MG capsule Take 2 capsules (40 mg total) by mouth daily. 07/04/19  Yes Breeback, Jade L, PA-C  ibuprofen (ADVIL) 600 MG tablet Take 1 tablet (600 mg total) by mouth every 6 (six) hours as needed. 06/07/19  Yes Dove, Myra C, MD  SUMAtriptan (IMITREX) 50 MG tablet TAKE 1 TABLET BY MOUTH EVERY 2 HOURS AS NEEDED FOR MIGRAINE. MAY REPEAT IN 2 HOURS IF HEADACHE PERSISTS OR RECURS Patient not taking: Reported on 01/01/2020 07/04/19 07/01/20  Donella Stade, PA-C    Family History Family History  Problem Relation Age of Onset  . Alzheimer's disease Mother     Social History Social History   Tobacco Use  . Smoking status: Never Smoker  . Smokeless tobacco: Never Used  Vaping Use  . Vaping Use: Never used  Substance Use Topics  . Alcohol use: Yes    Alcohol/week: 0.0 standard drinks  . Drug use: No     Allergies   Sulfamethoxazole-trimethoprim, Amoxicillin-pot clavulanate, Codeine, Doxycycline, and Sulfa antibiotics   Review of Systems Review of Systems  HENT: Positive for ear pain.      Physical Exam Triage Vital Signs ED Triage Vitals  Enc Vitals Group     BP 07/01/20 1157 122/83     Pulse Rate 07/01/20 1157 96     Resp 07/01/20 1157 18     Temp 07/01/20 1157 98.7 F (37.1 C)     Temp Source 07/01/20 1157 Oral     SpO2 07/01/20 1157 98 %     Weight --      Height --      Head Circumference --      Peak Flow --      Pain Score 07/01/20 1154 5     Pain Loc --      Pain Edu? --      Excl. in [place]? --    No data found.  Updated Vital Signs BP 122/83 (BP Location: Left Wrist)   Pulse 96   Temp 98.7 F (37.1 C) (Oral)   Resp 18   LMP 06/18/2020   SpO2 98%   Visual Acuity Right Eye Distance:   Left  Eye Distance:   Bilateral Distance:    Right Eye Near:   Left Eye Near:    Bilateral Near:     Physical Exam Vitals and nursing note reviewed.  Constitutional:      General: She is not in acute distress.    Appearance: Normal appearance. She is not ill-appearing, toxic-appearing or diaphoretic.  HENT:     Head: Normocephalic.     Right Ear: Tympanic membrane, ear canal and external ear normal.     Left Ear: Tympanic membrane, ear canal and external ear normal.  Eyes:     Conjunctiva/sclera: Conjunctivae normal.  Pulmonary:     Effort: Pulmonary effort is normal.  Musculoskeletal:        General: Normal range of motion.     Cervical back: Normal range of motion.  Skin:    General: Skin is warm and dry.     Findings: No rash.  Neurological:     Mental Status: She is alert.  Psychiatric:        Mood and Affect: Mood normal.      UC Treatments / Results  Labs (all labs ordered are listed, but only abnormal results are displayed) Labs Reviewed - No data to display  EKG   Radiology No results found.  Procedures Procedures (including critical care time)  Medications Ordered in UC Medications - No data to display  Initial Impression / Assessment and Plan / UC Course  I have reviewed the triage vital signs and the nursing notes.  Pertinent labs & imaging results that were available during my care of the patient were reviewed by me and considered in my medical decision making (see chart for details).    ETD No concern for ear infection on exam Treating with Flonase, aleve.  Follow up as needed for continued or worsening symptoms  Final Clinical Impressions(s) / UC Diagnoses   Final diagnoses:  ETD (Eustachian tube dysfunction), right     Discharge Instructions     Flonase 2 sprays daily Aleve 2 x a day Follow up as needed for continued or worsening symptoms     ED Prescriptions    None     PDMP not reviewed this encounter.   Orvan July,  NP 07/01/20 1422

## 2020-07-01 NOTE — Discharge Instructions (Signed)
Flonase 2 sprays daily Aleve 2 x a day Follow up as needed for continued or worsening symptoms

## 2020-07-26 ENCOUNTER — Emergency Department: Admit: 2020-07-26 | Discharge status: Absent without leave | Payer: BC Managed Care – PPO | Source: Home / Self Care

## 2020-07-26 ENCOUNTER — Emergency Department (INDEPENDENT_AMBULATORY_CARE_PROVIDER_SITE_OTHER)
Admission: EM | Admit: 2020-07-26 | Discharge: 2020-07-26 | Disposition: A | Discharge status: Home / Self Care | Payer: BC Managed Care – PPO | Source: Home / Self Care

## 2020-07-26 ENCOUNTER — Emergency Department (INDEPENDENT_AMBULATORY_CARE_PROVIDER_SITE_OTHER): Payer: BC Managed Care – PPO

## 2020-07-26 DIAGNOSIS — M79631 Pain in right forearm: Secondary | ICD-10-CM | POA: Diagnosis not present

## 2020-07-26 DIAGNOSIS — M79601 Pain in right arm: Secondary | ICD-10-CM | POA: Diagnosis not present

## 2020-07-26 DIAGNOSIS — R112 Nausea with vomiting, unspecified: Secondary | ICD-10-CM

## 2020-07-26 DIAGNOSIS — S59911A Unspecified injury of right forearm, initial encounter: Secondary | ICD-10-CM | POA: Diagnosis not present

## 2020-07-26 DIAGNOSIS — W010XXA Fall on same level from slipping, tripping and stumbling without subsequent striking against object, initial encounter: Secondary | ICD-10-CM

## 2020-07-26 MED ORDER — ONDANSETRON 4 MG PO TBDP: 4.0000 mg | ORAL_TABLET | Freq: Three times a day (TID) | ORAL | 0 refills | Status: DC | PRN

## 2020-07-26 MED ORDER — KETOROLAC TROMETHAMINE 60 MG/2ML IM SOLN
60.0000 mg | Freq: Once | INTRAMUSCULAR | Status: AC
  Administered 2020-07-26: 60 mg via INTRAMUSCULAR

## 2020-07-26 MED ORDER — DICLOFENAC SODIUM 50 MG PO TBEC: 50.0000 mg | DELAYED_RELEASE_TABLET | Freq: Two times a day (BID) | ORAL | 0 refills | Status: DC

## 2020-07-26 MED ORDER — TIZANIDINE HCL 4 MG PO TABS: 4.0000 mg | ORAL_TABLET | Freq: Four times a day (QID) | ORAL | 0 refills | Status: DC | PRN

## 2020-07-26 MED ORDER — ONDANSETRON HCL 4 MG/2ML IJ SOLN
4.0000 mg | Freq: Once | INTRAMUSCULAR | Status: AC
  Administered 2020-07-26: 4 mg via INTRAMUSCULAR

## 2020-07-26 MED ORDER — TRAMADOL HCL 50 MG PO TABS: 50.0000 mg | ORAL_TABLET | Freq: Four times a day (QID) | ORAL | 0 refills | Status: DC | PRN

## 2020-07-26 NOTE — ED Provider Notes (Addendum)
[name]    CSN: [id number] Arrival date & time: 07/26/20  1802      History   Chief Complaint No chief complaint on file.   HPI Janet Clayton is a 35 y.o. female.   HPI  Patient presents today with a right forearm injury after a trip and fall.  She reports pain is mostly localized to her mid forearm however she has maintained normal range of motion.  She denies any numbness or tingling.  Injury occurred today.    Past Medical History:  Diagnosis Date  . Anxiety   . Depression   . Vaginal Pap smear, abnormal     Patient Active Problem List   Diagnosis Date Noted  . CIN I (cervical intraepithelial neoplasia I) 07/17/2019  . Mild episode of recurrent major depressive disorder ([place]) 07/05/2019  . Anxiety and depression 07/04/2019  . Elevated blood pressure reading 11/16/2018  . Morbidly obese ([place]) 11/16/2018  . 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 HSV-2 (herpes simplex virus 2) infection 05/30/2018  . Dysplasia of cervix, low grade (CIN 1) 05/25/2018  . Genital warts 05/24/2018  . Inattention 02/18/2018  . Stress at work 02/18/2018  . Migraine 12/28/2017  . Vertigo 12/28/2017  . GAD (generalized anxiety disorder) 01/25/2017  . Adjustment reaction with anxiety and depression 12/15/2016  . Sleep disturbances 12/15/2016  . Family history of Alzheimer's disease 12/15/2016  . Hyperlipidemia 06/22/2014  . Dermatofibroma 06/19/2014  . Epidermal cyst 06/18/2014  . Obesity 06/18/2014  . Routine physical examination 06/18/2014    Past Surgical History:  Procedure Laterality Date  . LAPAROSCOPIC BILATERAL SALPINGECTOMY Bilateral 06/07/2019   Procedure: LAPAROSCOPIC BILATERAL SALPINGECTOMY;  Surgeon: Emily Filbert, MD;  Location: [facility];  Service: Gynecology;  Laterality: Bilateral;    OB History    Gravida  0   Para  0   Term  0   Preterm  0   AB  0   Living  0     SAB  0   IAB  0   Ectopic  0   Multiple  0   Live Births  0             Home Medications    Prior to Admission medications   Medication Sig Start Date End Date Taking? Authorizing Provider  clonazePAM (KLONOPIN) 0.5 MG tablet TAKE 1 TABLET(0.5 MG) BY MOUTH TWICE DAILY 06/26/19   Breeback, Jade L, PA-C  DULoxetine (CYMBALTA) 20 MG capsule Take 2 capsules (40 mg total) by mouth daily. 07/04/19   Breeback, Jade L, PA-C  ibuprofen (ADVIL) 600 MG tablet Take 1 tablet (600 mg total) by mouth every 6 (six) hours as needed. 06/07/19   Emily Filbert, MD  SUMAtriptan (IMITREX) 50 MG tablet TAKE 1 TABLET BY MOUTH EVERY 2 HOURS AS NEEDED FOR MIGRAINE. MAY REPEAT IN 2 HOURS IF HEADACHE PERSISTS OR RECURS Patient not taking: Reported on 01/01/2020 07/04/19 07/01/20  Donella Stade, PA-C    Family History Family History  Problem Relation Age of Onset  . Alzheimer's disease Mother     Social History Social History   Tobacco Use  . Smoking status: Never Smoker  . Smokeless tobacco: Never Used  Vaping Use  . Vaping Use: Never used  Substance Use Topics  . Alcohol use: Yes    Alcohol/week: 0.0 standard drinks    Comment: rare  . Drug use: No     Allergies   Sulfamethoxazole-trimethoprim, Amoxicillin-pot clavulanate, Codeine, Doxycycline, and Sulfa antibiotics  Review of Systems Review of Systems Pertinent negatives listed in HPI  Physical Exam Triage Vital Signs ED Triage Vitals  Enc Vitals Group     BP 07/26/20 1815 (!) 152/105     Pulse Rate 07/26/20 1815 100     Resp 07/26/20 1815 16     Temp 07/26/20 1815 98 F (36.7 C)     Temp Source 07/26/20 1815 Oral     SpO2 07/26/20 1815 98 %     Weight --      Height --      Head Circumference --      Peak Flow --      Pain Score 07/26/20 1813 9     Pain Loc --      Pain Edu? --      Excl. in [place]? --    No data found.  Updated Vital Signs BP (!) 152/105 (BP Location: Left Arm) Comment: 10/10 pain  Pulse 100   Temp 98 F (36.7 C) (Oral)   Resp 16   SpO2 98%   Visual Acuity Right Eye  Distance:   Left Eye Distance:   Bilateral Distance:    Right Eye Near:   Left Eye Near:    Bilateral Near:     Physical Exam General appearance: alert, well developed, well nourished, cooperative  Head: Normocephalic, without obvious abnormality, atraumatic Respiratory: Respirations even and unlabored, normal respiratory rate Heart: rate and rhythm normal. No gallop or murmurs noted on exam  Abdomen: BS +, no distention, no rebound tenderness, or no mass Extremities: No gross visible deformities. Spasms of forearm noted during exam. Limited RUE ROM Skin: Skin color, texture, turgor normal. No rashes seen  Psych: Appropriate mood and affect. Neurologic:GCS 15,normal coordination, normal gait   UC Treatments / Results  Labs (all labs ordered are listed, but only abnormal results are displayed) Labs Reviewed - No data to display  EKG   Radiology DG Forearm Right  Result Date: 07/26/2020 CLINICAL DATA:  Pain after falling EXAM: RIGHT FOREARM - 2 VIEW COMPARISON:  None. FINDINGS: There is no evidence of fracture or other focal bone lesions. Soft tissues are unremarkable. IMPRESSION: Negative. Electronically Signed   By: Prudencio Pair M.D.   On: 07/26/2020 18:52    Procedures Procedures (including critical care time)  Medications Ordered in UC Medications - No data to display  Initial Impression / Assessment and Plan / UC Course  I have reviewed the triage vital signs and the nursing notes.  Pertinent labs & imaging results that were available during my care of the patient were reviewed by me and considered in my medical decision making (see chart for details).     Right forearm injury negative for fracture per imaging today. Patient likely sustained muscle related pain due to the impact of injury.  Recommend RICE and ibuprofen for management of pain. Follow-up with Dr. Bevelyn Buckles if symptoms do not improve  Final Clinical Impressions(s) / UC Diagnoses   Final diagnoses:   Injury of right forearm, initial encounter   Discharge Instructions   None    ED Prescriptions    Medication Sig Dispense Auth. Provider   traMADol (ULTRAM) 50 MG tablet Take 1-2 tablets (50-100 mg total) by mouth every 6 (six) hours as needed. 15 tablet Scot Jun, FNP   tiZANidine (ZANAFLEX) 4 MG tablet Take 1 tablet (4 mg total) by mouth every 6 (six) hours as needed for muscle spasms. 30 tablet Scot Jun, FNP   diclofenac (  VOLTAREN) 50 MG EC tablet Take 1 tablet (50 mg total) by mouth 2 (two) times daily. 20 tablet Scot Jun, FNP     PDMP not reviewed this encounter.   Scot Jun, FNP 07/26/20 1912    Scot Jun, FNP 07/26/20 1914

## 2020-07-26 NOTE — ED Triage Notes (Signed)
Patient presents to Urgent Care with complaints of right forearm pain since tripping over a parking block and landing on her right arm, ulnar side. Patient reports she can move her elbow, pain is mostly in the mid-forearm, normal ROM in her hand, denies numbness or tingling distal to injury.

## 2020-07-29 ENCOUNTER — Ambulatory Visit (INDEPENDENT_AMBULATORY_CARE_PROVIDER_SITE_OTHER): Payer: BC Managed Care – PPO

## 2020-07-29 ENCOUNTER — Other Ambulatory Visit: Payer: Self-pay

## 2020-07-29 ENCOUNTER — Ambulatory Visit (INDEPENDENT_AMBULATORY_CARE_PROVIDER_SITE_OTHER): Payer: BC Managed Care – PPO | Admitting: Sports Medicine

## 2020-07-29 DIAGNOSIS — W19XXXA Unspecified fall, initial encounter: Secondary | ICD-10-CM | POA: Diagnosis not present

## 2020-07-29 DIAGNOSIS — S4991XA Unspecified injury of right shoulder and upper arm, initial encounter: Secondary | ICD-10-CM | POA: Diagnosis not present

## 2020-07-29 DIAGNOSIS — M25531 Pain in right wrist: Secondary | ICD-10-CM | POA: Diagnosis not present

## 2020-07-29 DIAGNOSIS — S52131A Displaced fracture of neck of right radius, initial encounter for closed fracture: Secondary | ICD-10-CM | POA: Insufficient documentation

## 2020-07-29 DIAGNOSIS — S52134A Nondisplaced fracture of neck of right radius, initial encounter for closed fracture: Secondary | ICD-10-CM | POA: Diagnosis not present

## 2020-07-29 MED ORDER — IBUPROFEN 800 MG PO TABS: 800.0000 mg | ORAL_TABLET | Freq: Three times a day (TID) | ORAL | 2 refills | Status: DC | PRN

## 2020-07-29 MED ORDER — TRAMADOL HCL 50 MG PO TABS: 50.0000 mg | ORAL_TABLET | Freq: Three times a day (TID) | ORAL | 0 refills | Status: DC | PRN

## 2020-07-29 NOTE — Progress Notes (Signed)
    Procedures performed today:    None.  Independent interpretation of notes and tests performed by another provider:   I did personally review her forearm x-rays, I do see a step-off at the radial head consistent with a fracture.  Brief History, Exam, Impression, and Recommendations:    Arm injury, right, initial encounter A very pleasant 35 year old female, a few days ago she tripped over a concrete divider, and impacted her right arm. She was seen in urgent care where x-rays of the forearm were overall unremarkable, she was placed in a thumb spica brace and referred to me for further evaluation and definitive treatment. On my personal review of the imaging she does have a step-off at the radial head consistent with radial head fracture. She has tenderness at the radial head, worsened with pronation and supination, and about 20 degrees of extension lag. In addition she has pain over the radial styloid process and at the anatomical snuffbox. Continue thumb spica brace, adding a sling, she likely has a radial head fracture and a wrist sprain. We are going to get dedicated x-rays of her right elbow, right wrist, add ibuprofen 800 mg 3 times daily per her request as well as tramadol for breakthrough pain. Sling for 2 weeks. Return to see me in 1 month.    ___________________________________________ Gwen Her. Dianah Field, M.D., ABFM., CAQSM. Primary Care and [place] Instructor of [employer] [facility] [employer]

## 2020-07-29 NOTE — Assessment & Plan Note (Addendum)
A very pleasant 35 year old female, a few days ago she tripped over a concrete divider, and impacted her right arm. She was seen in urgent care where x-rays of the forearm were overall unremarkable, she was placed in a thumb spica brace and referred to me for further evaluation and definitive treatment. On my personal review of the imaging she does have a step-off at the radial head consistent with radial head fracture. She has tenderness at the radial head, worsened with pronation and supination, and about 20 degrees of extension lag. In addition she has pain over the radial styloid process and at the anatomical snuffbox. Continue thumb spica brace, adding a sling, she likely has a radial head fracture and a wrist sprain. We are going to get dedicated x-rays of her right elbow, right wrist, add ibuprofen 800 mg 3 times daily per her request as well as tramadol for breakthrough pain. Sling for 2 weeks. Return to see me in 1 month.

## 2020-08-26 ENCOUNTER — Other Ambulatory Visit: Payer: Self-pay

## 2020-08-26 ENCOUNTER — Ambulatory Visit (INDEPENDENT_AMBULATORY_CARE_PROVIDER_SITE_OTHER): Payer: BC Managed Care – PPO

## 2020-08-26 ENCOUNTER — Ambulatory Visit (INDEPENDENT_AMBULATORY_CARE_PROVIDER_SITE_OTHER): Payer: BC Managed Care – PPO | Admitting: Sports Medicine

## 2020-08-26 DIAGNOSIS — S6991XA Unspecified injury of right wrist, hand and finger(s), initial encounter: Secondary | ICD-10-CM | POA: Diagnosis not present

## 2020-08-26 DIAGNOSIS — S52134A Nondisplaced fracture of neck of right radius, initial encounter for closed fracture: Secondary | ICD-10-CM

## 2020-08-26 DIAGNOSIS — M25531 Pain in right wrist: Secondary | ICD-10-CM | POA: Diagnosis not present

## 2020-08-26 DIAGNOSIS — S52121A Displaced fracture of head of right radius, initial encounter for closed fracture: Secondary | ICD-10-CM | POA: Diagnosis not present

## 2020-08-26 DIAGNOSIS — S52131A Displaced fracture of neck of right radius, initial encounter for closed fracture: Secondary | ICD-10-CM | POA: Diagnosis not present

## 2020-08-26 NOTE — Assessment & Plan Note (Signed)
That was approximately a month post fall, she did have some pain at the anatomical snuffbox and still does, she has been in a thumb spica brace. Initial x-rays were negative, repeat wrist x-rays with focus on the scaphoid. Starting formal physical therapy, return to see me in a month, MRI if no better.

## 2020-08-26 NOTE — Progress Notes (Signed)
    Procedures performed today:    None.  Independent interpretation of notes and tests performed by another provider:   None.  Brief History, Exam, Impression, and Recommendations:    Fracture of radial neck, right, closed Janet Clayton returns, she is a pleasant 35 year old female, approximately 4 weeks post nondisplaced right radial neck fracture, she still has some tenderness at the fracture site and approximately 20 degrees of supination lag. We will get some updated x-rays and I would like to start a bit of physical therapy. I would expect at least another 2 to 3 weeks for healing however. Return to see me in 4 to 6 weeks for this.   Right wrist injury That was approximately a month post fall, she did have some pain at the anatomical snuffbox and still does, she has been in a thumb spica brace. Initial x-rays were negative, repeat wrist x-rays with focus on the scaphoid. Starting formal physical therapy, return to see me in a month, MRI if no better.    ___________________________________________ Janet Clayton. Dianah Field, M.D., ABFM., CAQSM. Primary Care and [place] Instructor of [employer] [facility] [employer]

## 2020-08-26 NOTE — Assessment & Plan Note (Signed)
Janet Clayton returns, she is a pleasant 35 year old female, approximately 4 weeks post nondisplaced right radial neck fracture, she still has some tenderness at the fracture site and approximately 20 degrees of supination lag. We will get some updated x-rays and I would like to start a bit of physical therapy. I would expect at least another 2 to 3 weeks for healing however. Return to see me in 4 to 6 weeks for this.

## 2020-08-28 ENCOUNTER — Other Ambulatory Visit: Payer: Self-pay

## 2020-08-28 ENCOUNTER — Ambulatory Visit (INDEPENDENT_AMBULATORY_CARE_PROVIDER_SITE_OTHER): Payer: BC Managed Care – PPO | Admitting: Rehabilitative and Restorative Service Providers"

## 2020-08-28 DIAGNOSIS — M25531 Pain in right wrist: Secondary | ICD-10-CM

## 2020-08-28 DIAGNOSIS — M25521 Pain in right elbow: Secondary | ICD-10-CM

## 2020-08-28 NOTE — Patient Instructions (Signed)
Access Code: [email or web address] URL: [email or web address] Date: 08/28/2020 Prepared by: Rudell Cobb  Exercises Hand AROM Composite Flexion - 2 x daily - 7 x weekly - 1 sets - 10 reps Thumb Opposition - 2 x daily - 7 x weekly - 1 sets - 10 reps Wrist Flexion Extension AROM - Palms Down - 2 x daily - 7 x weekly - 1 sets - 10 reps Wrist Radial Ulnar Deviation AROM - 2 x daily - 7 x weekly - 1 sets - 10 reps Standing Wrist Flexion Stretch - 2 x daily - 7 x weekly - 1 sets - 3 reps - 20-30 seconds hold

## 2020-08-28 NOTE — Therapy (Signed)
[place], [place], [address] Phone: [phone]   Fax:  [phone]  Physical Therapy Evaluation  Patient Details  Name: Janet Clayton MRN: [id number] Date of Birth: 12/15/1985 Referring Provider (PT): Aundria Mems, MD   Encounter Date: 08/28/2020   PT End of Session - 08/28/20 1232    Visit Number 1    Number of Visits 12    Date for PT Re-Evaluation 10/09/20    Authorization Type BCBS-- 30 visits per year    Authorization - Visit Number 1    Authorization - Number of Visits 30    PT Start Time 0715    PT Stop Time 3329    PT Time Calculation (min) 40 min           Past Medical History:  Diagnosis Date  . Anxiety   . Depression   . Vaginal Pap smear, abnormal     Past Surgical History:  Procedure Laterality Date  . LAPAROSCOPIC BILATERAL SALPINGECTOMY Bilateral 06/07/2019   Procedure: LAPAROSCOPIC BILATERAL SALPINGECTOMY;  Surgeon: Emily Filbert, MD;  Location: [place];  Service: Gynecology;  Laterality: Bilateral;    There were no vitals filed for this visit.    Subjective Assessment - 08/28/20 0717    Subjective The patient reports a trip/fall over the parking block in the parking lot where she sustained a radial neck fracture on 07/26/20.  She also sprained her R wrist.  "The wrist has been more painful than my elbow."    Patient Stated Goals return to full use of R UE    Currently in Pain? Yes    Pain Location Arm    Pain Orientation Right    Pain Descriptors / Indicators Spasm   feels tired, like muscles are overworked at rest, thumb spasms at times; hurt with pronation/supination   Pain Type Acute pain    Pain Radiating Towards spasms into the thumb when trying to use hand too much    Pain Onset More than a month ago    Pain Frequency Intermittent    Aggravating Factors  pronation/supination    Pain Relieving Factors rest              [facility] PT  Assessment - 08/28/20 0727      Assessment   Medical Diagnosis Nondisplaced fracture of the right radius, wrist pain    Referring Provider (PT) Aundria Mems, MD    Onset Date/Surgical Date 07/26/20    Hand Dominance Right    Prior Therapy none      Precautions   Precautions Other (comment)    Precaution Comments healing time to 6 weeks for fracture;      Restrictions   Weight Bearing Restrictions No      Balance Screen   Has the patient fallen in the past 6 months Yes    How many times? 1-trip and fall, accidental    Has the patient had a decrease in activity level because of a fear of falling?  No    Is the patient reluctant to leave their home because of a fear of falling?  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   Focus on Therapeutic Outcomes (FOTO)  49%      Sensation   Light Touch Appears Intact      ROM / Strength   AROM / PROM / Strength AROM;Strength      AROM   Overall AROM  Deficits    Overall AROM Comments feels some "stretch" with finger opposition;  **PT educated patient to not push into supination at home--give up until 6 weeks for bone healing time**    AROM Assessment Site Thumb;Wrist;Forearm;Elbow    Right/Left Elbow Right;Left    Right Elbow Flexion 115    Right Elbow Extension -18    Left Elbow Flexion 130    Left Elbow Extension 0    Right/Left Forearm Right;Left    Right Forearm Pronation 65 Degrees    Right Forearm Supination -12 Degrees   from neutral   Right/Left Wrist Left    Right Wrist Extension 52 Degrees    Right Wrist Flexion 40 Degrees    Left Wrist Extension 62 Degrees    Left Wrist Flexion 55 Degrees    Right/Left Thumb Right;Left    Right Thumb Opposition Digit  2;Digit 3;Digit 4;Digit 5   sensation of tightness in web space     Strength   Overall Strength Deficits    Strength Assessment Site Hand;Wrist;Elbow    Right/Left Elbow --   deferred due to bone healing time   Right/Left Wrist --   deferred due to bone healing time   Right/Left hand Right;Left    Right Hand Grip (lbs) 14    Right Hand Lateral Pinch 5 lbs    Left Hand Grip (lbs) 44    Left Hand Lateral Pinch 7 lbs                      Objective measurements completed on examination: See above findings.               PT Education - 08/28/20 0749    Education Details HEP    Person(s) Educated Patient    Methods Explanation;Demonstration;Handout    Comprehension Verbalized understanding;Returned demonstration               PT Long Term Goals - 08/28/20 1233      PT LONG TERM GOAL #1   Title The patient will be indep with HEP for R wrist, elbow and hand.    Time 6    Period Weeks    Target Date 10/09/20      PT LONG TERM GOAL #2   Title The patient will improve functional status score from 49% to > or equal to 65%.    Time 6    Period Weeks    Target Date 10/09/20      PT LONG TERM GOAL #3   Title The patient will improve forearm supination from -12 to 30 degrees supination.    Time 6    Period Weeks    Target Date 10/09/20      PT LONG TERM GOAL #4   Title The patient will return to opening jars, ADLs, meal prep, etc with full use of R UE.    Time 6    Period Weeks    Target Date 10/09/20      PT LONG TERM GOAL #5   Title The patient will improve R elbow extension to full.    Baseline -18 degrees.    Time 6    Period Weeks  Target Date 10/09/20                  Plan - 08/28/20 0758    Clinical Impression Statement The patient is a 35 yo female presenting to OP physical therapy s/p fall with radial neck fx on 07/26/20.  She presents with impairments in wrist ROM, elbow ROM, most limited in forearm supination, tightness in  hand musculature, decreased strength leading to dec'd ability to eat, perform ADLs and work activities.  PT to address deficits to return to prior functional status.    Examination-Activity Limitations Lift;Reach Overhead;Self Feeding;Carry    Examination-Participation Restrictions Occupation;Meal Prep;Laundry    Stability/Clinical Decision Making Stable/Uncomplicated    Clinical Decision Making Low    Rehab Potential Good    PT Frequency 2x / week    PT Duration 6 weeks    PT Treatment/Interventions ADLs/Self Care Home Management;Patient/family education;Therapeutic activities;Therapeutic exercise;Neuromuscular re-education;Taping;Dry needling;Cryotherapy;Iontophoresis 4mg /ml Dexamethasone;Moist Heat;Manual techniques    PT Next Visit Plan *Checking with MD on specific parameters to clarify* AROM elbow, wrist, fingers, isometric wrist strengthening as tolerated.  Progress to LTGs.    PT Home Exercise Plan Access Code: [email or web address]    Consulted and Agree with Plan of Care Patient           Patient will benefit from skilled therapeutic intervention in order to improve the following deficits and impairments:  Pain,Hypomobility,Impaired flexibility,Decreased strength,Decreased range of motion,Decreased activity tolerance  Visit Diagnosis: Pain in right wrist  Pain in right elbow     Problem List Patient Active Problem List   Diagnosis Date Noted  . Right wrist injury 08/26/2020  . Fracture of radial neck, right, closed 07/29/2020  . CIN I (cervical intraepithelial neoplasia I) 07/17/2019  . Mild episode of recurrent major depressive disorder ([place]) 07/05/2019  . Anxiety and depression 07/04/2019  . Elevated blood pressure reading 11/16/2018  . Morbidly obese ([place]) 11/16/2018  . 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 HSV-2 (herpes simplex virus 2) infection 05/30/2018  . Dysplasia of cervix, low grade (CIN 1) 05/25/2018  . Genital warts 05/24/2018  . Inattention 02/18/2018  . Stress at work  02/18/2018  . Migraine 12/28/2017  . Vertigo 12/28/2017  . GAD (generalized anxiety disorder) 01/25/2017  . Adjustment reaction with anxiety and depression 12/15/2016  . Sleep disturbances 12/15/2016  . Family history of Alzheimer's disease 12/15/2016  . Hyperlipidemia 06/22/2014  . Dermatofibroma 06/19/2014  . Epidermal cyst 06/18/2014  . Obesity 06/18/2014  . Routine physical examination 06/18/2014    Jannelle Notaro , PT 08/28/2020, 12:56 PM  [facility] [place], [place], [address] Phone: [phone]   Fax:  [phone]  Name: Janet Clayton MRN: [id number] Date of Birth: 07/30/1985

## 2020-09-04 ENCOUNTER — Encounter: Payer: BC Managed Care – PPO | Admitting: Rehabilitative and Restorative Service Providers"

## 2020-09-06 ENCOUNTER — Other Ambulatory Visit: Payer: Self-pay | Admitting: Physician Assistant

## 2020-09-06 DIAGNOSIS — N94819 Vulvodynia, unspecified: Secondary | ICD-10-CM

## 2020-09-06 DIAGNOSIS — F330 Major depressive disorder, recurrent, mild: Secondary | ICD-10-CM

## 2020-09-09 ENCOUNTER — Encounter: Payer: Self-pay | Admitting: Rehabilitative and Restorative Service Providers"

## 2020-09-09 ENCOUNTER — Ambulatory Visit (INDEPENDENT_AMBULATORY_CARE_PROVIDER_SITE_OTHER): Payer: BC Managed Care – PPO | Admitting: Rehabilitative and Restorative Service Providers"

## 2020-09-09 ENCOUNTER — Other Ambulatory Visit: Payer: Self-pay

## 2020-09-09 DIAGNOSIS — M25521 Pain in right elbow: Secondary | ICD-10-CM | POA: Diagnosis not present

## 2020-09-09 DIAGNOSIS — M6281 Muscle weakness (generalized): Secondary | ICD-10-CM | POA: Diagnosis not present

## 2020-09-09 DIAGNOSIS — M25531 Pain in right wrist: Secondary | ICD-10-CM

## 2020-09-09 NOTE — Patient Instructions (Signed)
Access Code: [email or web address] URL: [email or web address] Date: 09/09/2020 Prepared by: Rudell Cobb  Exercises Standing Wrist Flexion Stretch - 2 x daily - 7 x weekly - 1 sets - 3 reps - 20-30 seconds hold Seated Wrist Supination Stretch - 2 x daily - 7 x weekly - 1 sets - 10 reps Seated Isometric Thumb Extension with Manual Resistance - 2 x daily - 7 x weekly - 1 sets - 5 reps - 5 seconds hold Seated Isometric Thumb Flexion - 2 x daily - 7 x weekly - 1 sets - 5 reps - 5 seconds hold Thumb Strengthening Stabilization CMC - 2 x daily - 7 x weekly - 1 sets - 5 reps - 5 seconds hold Standing Elbow Flexion Extension AROM - 2 x daily - 7 x weekly - 1 sets - 10 reps Standing Anatomical Position with Scapular Retraction and Depression at Wall - 2 x daily - 7 x weekly - 1 sets - 10 reps

## 2020-09-09 NOTE — Therapy (Signed)
[place], [place], [address] Phone: [phone]   Fax:  [phone]  Physical Therapy Treatment  Patient Details  Name: Janet Clayton MRN: [id number] Date of Birth: [birth year] Referring Provider (PT): Aundria Mems, MD   Encounter Date: 09/09/2020   PT End of Session - 09/09/20 0758    Visit Number 2    Number of Visits 12    Date for PT Re-Evaluation 10/09/20    Authorization Type BCBS-- 30 visits per year    Authorization - Visit Number 2    Authorization - Number of Visits 30    PT Start Time 0718    PT Stop Time 0758    PT Time Calculation (min) 40 min    Activity Tolerance Patient tolerated treatment well    Behavior During Therapy [facility] for tasks assessed/performed           Past Medical History:  Diagnosis Date  . Anxiety   . Depression   . Vaginal Pap smear, abnormal     Past Surgical History:  Procedure Laterality Date  . LAPAROSCOPIC BILATERAL SALPINGECTOMY Bilateral 06/07/2019   Procedure: LAPAROSCOPIC BILATERAL SALPINGECTOMY;  Surgeon: Emily Filbert, MD;  Location: [place];  Service: Gynecology;  Laterality: Bilateral;    There were no vitals filed for this visit.   Subjective Assessment - 09/09/20 0717    Subjective The patient reports that she gets pain in the morning or when pushing the seat belt button.  She is working on exercises and getting greater ROM.  The wrist is still more painful than the elbow.    Patient Stated Goals return to full use of R UE    Currently in Pain? Yes    Pain Score 5     Pain Location Arm    Pain Orientation Right    Pain Descriptors / Indicators Sore;Spasm    Pain Type Acute pain    Pain Onset More than a month ago    Pain Frequency Intermittent    Aggravating Factors  pronation/supination    Pain Relieving Factors rest              [facility] PT Assessment - 09/09/20 0720      Assessment   Medical Diagnosis  Nondisplaced fracture of the right radius, wrist pain    Referring Provider (PT) Aundria Mems, MD    Onset Date/Surgical Date 07/26/20    Hand Dominance Right      AROM   Overall AROM  Deficits    Right Forearm Supination 32 Degrees                         [facility] Adult PT Treatment/Exercise - 09/09/20 0721      Exercises   Exercises Wrist;Hand;Elbow      Elbow Exercises   Forearm Supination AROM;Right;10 reps    Forearm Pronation AROM;Right;10 reps    Other elbow exercises standing anatomical position with UEs x 10 reps x 5 second holds      Hand Exercises   MCPJ Flexion Limitations finger web yellow resistance    Thumb Opposition --   to 5th digit   Other Hand Exercises gripper with 2 yellow bands x 10 repetitions    Other Hand Exercises isometric C cup position for strengthening, thumb MCP extension/flexion isometric x 5 reps each      Wrist Exercises   Wrist Flexion Strengthening;Right;10 reps    Wrist Extension  Strengthening;Right;10 reps    Other wrist exercises yellow therabar with wringing out motion    Other wrist exercises reviewed wrist flexion/extension stretches      Manual Therapy   Manual Therapy Soft tissue mobilization    Manual therapy comments to reduce myofascial tightness    Soft tissue mobilization IASTM and STM forearm and wrist/MCP thumb                  PT Education - 09/09/20 0756    Education Details HEP progression    Person(s) Educated Patient    Methods Explanation;Demonstration;Handout    Comprehension Verbalized understanding;Returned demonstration               PT Long Term Goals - 08/28/20 1233      PT LONG TERM GOAL #1   Title The patient will be indep with HEP for R wrist, elbow and hand.    Time 6    Period Weeks    Target Date 10/09/20      PT LONG TERM GOAL #2   Title The patient will improve functional status score from 49% to > or equal to 65%.    Time 6    Period Weeks    Target Date  10/09/20      PT LONG TERM GOAL #3   Title The patient will improve forearm supination from -12 to 30 degrees supination.    Time 6    Period Weeks    Target Date 10/09/20      PT LONG TERM GOAL #4   Title The patient will return to opening jars, ADLs, meal prep, etc with full use of R UE.    Time 6    Period Weeks    Target Date 10/09/20      PT LONG TERM GOAL #5   Title The patient will improve R elbow extension to full.    Baseline -18 degrees.    Time 6    Period Weeks    Target Date 10/09/20                 Plan - 09/09/20 0803    Clinical Impression Statement The patient is progressing with improved supination ROM from -12 to 35 degrees today (improved further after STM).  Plan to continue to progress to patient tolerance.    PT Treatment/Interventions ADLs/Self Care Home Management;Patient/family education;Therapeutic activities;Therapeutic exercise;Neuromuscular re-education;Taping;Dry needling;Cryotherapy;Iontophoresis 4mg /ml Dexamethasone;Moist Heat;Manual techniques    PT Next Visit Plan AROM elbow, wrist, fingers, isometric wrist strengthening as tolerated.  Progress to LTGs.    PT Home Exercise Plan Access Code: [email or web address]    Consulted and Agree with Plan of Care Patient           Patient will benefit from skilled therapeutic intervention in order to improve the following deficits and impairments:     Visit Diagnosis: Pain in right wrist  Pain in right elbow  Muscle weakness (generalized)     Problem List Patient Active Problem List   Diagnosis Date Noted  . Right wrist injury 08/26/2020  . Fracture of radial neck, right, closed 07/29/2020  . CIN I (cervical intraepithelial neoplasia I) 07/17/2019  . Mild episode of recurrent major depressive disorder ([place]) 07/05/2019  . Anxiety and depression 07/04/2019  . Elevated blood pressure reading 11/16/2018  . Morbidly obese ([place]) 11/16/2018  . 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 HSV-2 (herpes simplex virus 2)  infection 05/30/2018  . Dysplasia of cervix, low grade (CIN 1) 05/25/2018  . Genital  warts 05/24/2018  . Inattention 02/18/2018  . Stress at work 02/18/2018  . Migraine 12/28/2017  . Vertigo 12/28/2017  . GAD (generalized anxiety disorder) 01/25/2017  . Adjustment reaction with anxiety and depression 12/15/2016  . Sleep disturbances 12/15/2016  . Family history of Alzheimer's disease 12/15/2016  . Hyperlipidemia 06/22/2014  . Dermatofibroma 06/19/2014  . Epidermal cyst 06/18/2014  . Obesity 06/18/2014  . Routine physical examination 06/18/2014    Raquel Racey, PT 09/09/2020, 11:42 AM  [facility] [place], [place], [address] Phone: [phone]   Fax:  [phone]  Name: Janet Clayton MRN: [id number] Date of Birth: [birth year]

## 2020-09-11 ENCOUNTER — Ambulatory Visit (INDEPENDENT_AMBULATORY_CARE_PROVIDER_SITE_OTHER): Payer: BC Managed Care – PPO | Admitting: Rehabilitative and Restorative Service Providers"

## 2020-09-11 ENCOUNTER — Other Ambulatory Visit: Payer: Self-pay

## 2020-09-11 DIAGNOSIS — M25521 Pain in right elbow: Secondary | ICD-10-CM

## 2020-09-11 DIAGNOSIS — M6281 Muscle weakness (generalized): Secondary | ICD-10-CM

## 2020-09-11 DIAGNOSIS — M25531 Pain in right wrist: Secondary | ICD-10-CM

## 2020-09-11 NOTE — Therapy (Signed)
[place], [place], [address] Phone: [phone]   Fax:  [phone]  Physical Therapy Treatment  Patient Details  Name: Janet Clayton MRN: [id number] Date of Birth: 11-13-1985 Referring Provider (PT): Aundria Mems, MD   Encounter Date: 09/11/2020   PT End of Session - 09/11/20 0720    Visit Number 3    Number of Visits 12    Date for PT Re-Evaluation 10/09/20    Authorization Type BCBS-- 30 visits per year    Authorization - Visit Number 3    Authorization - Number of Visits 30    PT Start Time [phone]    PT Stop Time 0800    PT Time Calculation (min) 42 min    Activity Tolerance Patient tolerated treatment well    Behavior During Therapy [facility] for tasks assessed/performed           Past Medical History:  Diagnosis Date  . Anxiety   . Depression   . Vaginal Pap smear, abnormal     Past Surgical History:  Procedure Laterality Date  . LAPAROSCOPIC BILATERAL SALPINGECTOMY Bilateral 06/07/2019   Procedure: LAPAROSCOPIC BILATERAL SALPINGECTOMY;  Surgeon: Emily Filbert, MD;  Location: [place];  Service: Gynecology;  Laterality: Bilateral;    There were no vitals filed for this visit.   Subjective Assessment - 09/11/20 0719    Subjective The patient is moving and has some swelling after packing boxes.    Patient Stated Goals return to full use of R UE    Currently in Pain? Yes    Pain Score 2     Pain Location Arm    Pain Orientation Right    Pain Descriptors / Indicators Sore;Aching    Pain Type Acute pain    Pain Onset More than a month ago    Pain Frequency Intermittent    Aggravating Factors  pronation/supination    Pain Relieving Factors rest              [facility] PT Assessment - 09/11/20 0721      Assessment   Medical Diagnosis Nondisplaced fracture of the right radius, wrist pain    Referring Provider (PT) Aundria Mems, MD    Onset  Date/Surgical Date 07/26/20      AROM   Overall AROM  Deficits    Right Elbow Extension -12                         [facility] Adult PT Treatment/Exercise - 09/11/20 0721      Exercises   Exercises Wrist;Hand;Elbow      Elbow Exercises   Elbow Flexion AROM;Strengthening;Right;10 reps    Bar Weights/Barbell (Elbow Flexion) 3 lbs    Elbow Extension AAROM;Strengthening;Right;10 reps    Bar Weights/Barbell (Elbow Extension) 3 lbs    Forearm Supination AROM;Strengthening;Right;10 reps    Bar Weights/Barbell (Forearm Supination) 1 lb    Forearm Pronation AROM;Right;10 reps    Bar Weights/Barbell (Forearm Pronation) 1 lb    Other elbow exercises wall push up x 10 reps    Other elbow exercises standing biceps stretch with table *painful; wall stretch biceps      Manual Therapy   Manual Therapy Soft tissue mobilization    Manual therapy comments skilled palpation to assess response to STM and DN    Soft tissue mobilization STM pronator, biceps, brachioradialis            Trigger Point  Dry Needling - 09/11/20 0733    Consent Given? Yes    Education Handout Provided Yes    Muscles Treated Upper Quadrant Biceps    Muscles Treated Wrist/Hand Pronator teres    Other Dry Needling right UE    Biceps Response Palpable increased muscle length    Pronator teres Response Palpable increased muscle length                     PT Long Term Goals - 08/28/20 1233      PT LONG TERM GOAL #1   Title The patient will be indep with HEP for R wrist, elbow and hand.    Time 6    Period Weeks    Target Date 10/09/20      PT LONG TERM GOAL #2   Title The patient will improve functional status score from 49% to > or equal to 65%.    Time 6    Period Weeks    Target Date 10/09/20      PT LONG TERM GOAL #3   Title The patient will improve forearm supination from -12 to 30 degrees supination.    Time 6    Period Weeks    Target Date 10/09/20      PT LONG TERM GOAL #4    Title The patient will return to opening jars, ADLs, meal prep, etc with full use of R UE.    Time 6    Period Weeks    Target Date 10/09/20      PT LONG TERM GOAL #5   Title The patient will improve R elbow extension to full.    Baseline -18 degrees.    Time 6    Period Weeks    Target Date 10/09/20                 Plan - 09/11/20 1219    Clinical Impression Statement The patient continues with elbow extension limitation.  PT continuing to progress ROM, strengthening and return to functional tasks.    PT Treatment/Interventions ADLs/Self Care Home Management;Patient/family education;Therapeutic activities;Therapeutic exercise;Neuromuscular re-education;Taping;Dry needling;Cryotherapy;Iontophoresis 4mg /ml Dexamethasone;Moist Heat;Manual techniques    PT Next Visit Plan AROM elbow, wrist, fingers, isometric wrist strengthening as tolerated.  Progress to LTGs.    PT Home Exercise Plan Access Code: [email or web address]           Patient will benefit from skilled therapeutic intervention in order to improve the following deficits and impairments:     Visit Diagnosis: Pain in right wrist  Pain in right elbow  Muscle weakness (generalized)     Problem List Patient Active Problem List   Diagnosis Date Noted  . Right wrist injury 08/26/2020  . Fracture of radial neck, right, closed 07/29/2020  . CIN I (cervical intraepithelial neoplasia I) 07/17/2019  . Mild episode of recurrent major depressive disorder ([place]) 07/05/2019  . Anxiety and depression 07/04/2019  . Elevated blood pressure reading 11/16/2018  . Morbidly obese ([place]) 11/16/2018  . 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 HSV-2 (herpes simplex virus 2) infection 05/30/2018  . Dysplasia of cervix, low grade (CIN 1) 05/25/2018  . Genital warts 05/24/2018  . Inattention 02/18/2018  . Stress at work 02/18/2018  . Migraine 12/28/2017  . Vertigo 12/28/2017  . GAD (generalized anxiety disorder) 01/25/2017  . Adjustment reaction with  anxiety and depression 12/15/2016  . Sleep disturbances 12/15/2016  . Family history of Alzheimer's disease 12/15/2016  . Hyperlipidemia 06/22/2014  . Dermatofibroma 06/19/2014  . Epidermal  cyst 06/18/2014  . Obesity 06/18/2014  . Routine physical examination 06/18/2014    Perez Dirico, PT 09/11/2020, 12:23 PM  [facility] [place], [place], [address] Phone: [phone]   Fax:  [phone]  Name: Huma Imhoff MRN: [id number] Date of Birth: Aug 22, 1985

## 2020-09-18 ENCOUNTER — Ambulatory Visit (INDEPENDENT_AMBULATORY_CARE_PROVIDER_SITE_OTHER): Payer: BC Managed Care – PPO | Admitting: Rehabilitative and Restorative Service Providers"

## 2020-09-18 ENCOUNTER — Other Ambulatory Visit: Payer: Self-pay

## 2020-09-18 DIAGNOSIS — M25531 Pain in right wrist: Secondary | ICD-10-CM | POA: Diagnosis not present

## 2020-09-18 DIAGNOSIS — M6281 Muscle weakness (generalized): Secondary | ICD-10-CM | POA: Diagnosis not present

## 2020-09-18 DIAGNOSIS — M25521 Pain in right elbow: Secondary | ICD-10-CM

## 2020-09-18 NOTE — Therapy (Signed)
[place], [place], [address] Phone: [phone]   Fax:  [phone]  Physical Therapy Treatment  Patient Details  Name: Janet Clayton MRN: [id number] Date of Birth: 1986-04-02 Referring Provider (PT): Aundria Mems, MD   Encounter Date: 09/18/2020   PT End of Session - 09/18/20 0718    Visit Number 4    Number of Visits 12    Date for PT Re-Evaluation 10/09/20    Authorization Type BCBS-- 30 visits per year    Authorization - Visit Number 4    Authorization - Number of Visits 30    PT Start Time 0715    PT Stop Time 5885    PT Time Calculation (min) 40 min    Activity Tolerance Patient tolerated treatment well    Behavior During Therapy [facility] for tasks assessed/performed           Past Medical History:  Diagnosis Date  . Anxiety   . Depression   . Vaginal Pap smear, abnormal     Past Surgical History:  Procedure Laterality Date  . LAPAROSCOPIC BILATERAL SALPINGECTOMY Bilateral 06/07/2019   Procedure: LAPAROSCOPIC BILATERAL SALPINGECTOMY;  Surgeon: Emily Filbert, MD;  Location: [place];  Service: Gynecology;  Laterality: Bilateral;    There were no vitals filed for this visit.   Subjective Assessment - 09/18/20 0717    Subjective The patient was able to move over the weekend.  She was not sure if she was sore from dry needling or her moving.  She still cannot grip well to open a jar.    Patient Stated Goals return to full use of R UE    Currently in Pain? No/denies              [facility] PT Assessment - 09/18/20 0719      Assessment   Medical Diagnosis Nondisplaced fracture of the right radius, wrist pain    Referring Provider (PT) Aundria Mems, MD    Onset Date/Surgical Date 07/26/20    Hand Dominance Right                         [facility] Adult PT Treatment/Exercise - 09/18/20 0720      Exercises   Exercises Wrist;Hand;Elbow       Elbow Exercises   Elbow Flexion PROM;AROM;Strengthening;Right;10 reps    Bar Weights/Barbell (Elbow Flexion) 3 lbs    Elbow Flexion Limitations supine    Elbow Extension PROM;AROM;Right;10 reps    Bar Weights/Barbell (Elbow Extension) 3 lbs    Forearm Supination AROM;PROM;Strengthening    Bar Weights/Barbell (Forearm Supination) --   with a hammer to overpressure into supination   Forearm Supination Limitations 5 reps x 10 seconds    Other elbow exercises UBE x 2 minutes forward/ 1 minute backward    Other elbow exercises supine shoulder protraction x 10 reps with 3 lbs; standing elbow flexion with 3 lbs      Wrist Exercises   Wrist Flexion AROM;Right;5 reps    Wrist Extension AROM;Right;5 reps    Other wrist exercises standing weight bearing/weight shifting laterally with weight through wrists    Other wrist exercises wall push ups x 10 reps      Manual Therapy   Manual Therapy Soft tissue mobilization;Joint mobilization    Manual therapy comments to reduce myofascial tightness    Joint Mobilization elbow flexion + distraction grade II, pronatin/supination mobs grade I-II  Soft tissue mobilization sTM and IASTM for R biceps, pronators, flexor digitorum                  PT Education - 09/18/20 0756    Education Details HEP    Person(s) Educated Patient    Methods Explanation;Demonstration;Handout    Comprehension Verbalized understanding;Returned demonstration               PT Long Term Goals - 08/28/20 1233      PT LONG TERM GOAL #1   Title The patient will be indep with HEP for R wrist, elbow and hand.    Time 6    Period Weeks    Target Date 10/09/20      PT LONG TERM GOAL #2   Title The patient will improve functional status score from 49% to > or equal to 65%.    Time 6    Period Weeks    Target Date 10/09/20      PT LONG TERM GOAL #3   Title The patient will improve forearm supination from -12 to 30 degrees supination.    Time 6    Period Weeks     Target Date 10/09/20      PT LONG TERM GOAL #4   Title The patient will return to opening jars, ADLs, meal prep, etc with full use of R UE.    Time 6    Period Weeks    Target Date 10/09/20      PT LONG TERM GOAL #5   Title The patient will improve R elbow extension to full.    Baseline -18 degrees.    Time 6    Period Weeks    Target Date 10/09/20                 Plan - 09/18/20 1204    Clinical Impression Statement The patient continues to demonstrate improving ROM for supination and elbow extension.  PT continuing to progress loading activities and strengthening for return to full work and home activities. Pain is decreasing and patient is tolerating ther ex well.    PT Treatment/Interventions ADLs/Self Care Home Management;Patient/family education;Therapeutic activities;Therapeutic exercise;Neuromuscular re-education;Taping;Dry needling;Cryotherapy;Iontophoresis 4mg /ml Dexamethasone;Moist Heat;Manual techniques    PT Next Visit Plan AROM elbow and pronation/supination; UE/wrist strengthening as tolerated.  Progress to LTGs.    PT Home Exercise Plan Access Code: [email or web address]    Consulted and Agree with Plan of Care Patient           Patient will benefit from skilled therapeutic intervention in order to improve the following deficits and impairments:  Pain,Hypomobility,Impaired flexibility,Decreased strength,Decreased range of motion,Decreased activity tolerance  Visit Diagnosis: Pain in right wrist  Pain in right elbow  Muscle weakness (generalized)     Problem List Patient Active Problem List   Diagnosis Date Noted  . Right wrist injury 08/26/2020  . Fracture of radial neck, right, closed 07/29/2020  . CIN I (cervical intraepithelial neoplasia I) 07/17/2019  . Mild episode of recurrent major depressive disorder ([place]) 07/05/2019  . Anxiety and depression 07/04/2019  . Elevated blood pressure reading 11/16/2018  . Morbidly obese ([place]) 11/16/2018  . Vulvodynia  11/14/2018  . HSV-2 (herpes simplex virus 2) infection 05/30/2018  . Dysplasia of cervix, low grade (CIN 1) 05/25/2018  . Genital warts 05/24/2018  . Inattention 02/18/2018  . Stress at work 02/18/2018  . Migraine 12/28/2017  . Vertigo 12/28/2017  . GAD (generalized anxiety disorder) 01/25/2017  . Adjustment reaction with anxiety  and depression 12/15/2016  . Sleep disturbances 12/15/2016  . Family history of Alzheimer's disease 12/15/2016  . Hyperlipidemia 06/22/2014  . Dermatofibroma 06/19/2014  . Epidermal cyst 06/18/2014  . Obesity 06/18/2014  . Routine physical examination 06/18/2014    [place], PT 09/18/2020, 12:14 PM  [facility] [place], [place], [address] Phone: [phone]   Fax:  [phone]  Name: Janet Clayton MRN: [id number] Date of Birth: 05-15-1985

## 2020-09-18 NOTE — Patient Instructions (Signed)
Access Code: [email or web address] URL: [email or web address] Date: 09/18/2020 Prepared by: Rudell Cobb  Exercises Thumb Strengthening Stabilization CMC - 2 x daily - 7 x weekly - 1 sets - 5 reps - 5 seconds hold Seated Forearm Pronation and Supination with Hammer - 2 x daily - 7 x weekly - 1 sets - 5 reps - 10-15 seconds hold Standing Single Arm Elbow Flexion with Resistance - 2 x daily - 7 x weekly - 1 sets - 10 reps Standing Elbow Extension with Self-Anchored Resistance - 2 x daily - 7 x weekly - 1 sets - 10 reps Standing Wrist Flexion Stretch - 2 x daily - 7 x weekly - 1 sets - 3 reps - 20-30 seconds hold Standing Anatomical Position with Scapular Retraction and Depression at Wall - 2 x daily - 7 x weekly - 1 sets - 10 reps Standing Bicep Stretch at Wall - 2 x daily - 7 x weekly - 1 sets - 10 reps Ulnar Nerve Flossing - 2 x daily - 7 x weekly - 1 sets - 10 reps Supine Elbow Extension Stretch in Supination - 2 x daily - 7 x weekly - 1 sets - 1 reps - 2 minutes hold

## 2020-09-23 ENCOUNTER — Encounter: Payer: Self-pay | Admitting: Physical Therapy

## 2020-09-23 ENCOUNTER — Ambulatory Visit (INDEPENDENT_AMBULATORY_CARE_PROVIDER_SITE_OTHER): Payer: BC Managed Care – PPO | Admitting: Physical Therapy

## 2020-09-23 ENCOUNTER — Other Ambulatory Visit: Payer: Self-pay

## 2020-09-23 ENCOUNTER — Ambulatory Visit (INDEPENDENT_AMBULATORY_CARE_PROVIDER_SITE_OTHER): Payer: BC Managed Care – PPO | Admitting: Sports Medicine

## 2020-09-23 DIAGNOSIS — M6281 Muscle weakness (generalized): Secondary | ICD-10-CM | POA: Diagnosis not present

## 2020-09-23 DIAGNOSIS — S6991XD Unspecified injury of right wrist, hand and finger(s), subsequent encounter: Secondary | ICD-10-CM | POA: Diagnosis not present

## 2020-09-23 DIAGNOSIS — S52134D Nondisplaced fracture of neck of right radius, subsequent encounter for closed fracture with routine healing: Secondary | ICD-10-CM | POA: Diagnosis not present

## 2020-09-23 DIAGNOSIS — M25521 Pain in right elbow: Secondary | ICD-10-CM

## 2020-09-23 DIAGNOSIS — M25531 Pain in right wrist: Secondary | ICD-10-CM

## 2020-09-23 NOTE — Progress Notes (Signed)
    Procedures performed today:    None.  Independent interpretation of notes and tests performed by another provider:   None.  Brief History, Exam, Impression, and Recommendations:    Fracture of radial neck, right, closed Janet Clayton returns, she is a pleasant 35 year old female, she is now approximately 8 weeks post nondisplaced transverse right radial neck fracture, today she does not have any tenderness over the fracture, passively I can get her through full range of motion for extension, flexion, pronation, and supination though she reports some active supination and extension lag. She is continuing with physical therapy, I think we can go ahead and tentatively leave an open-ended follow-up from a MD perspective, and physical therapy can graduate her to an HEP. Return to see me as needed for this.  Right wrist injury Wrist is doing well, no pain in the snuffbox, x-rays were unremarkable, PT continues to work on this, good motion, good strength, no areas of tenderness to palpation, return as needed.    ___________________________________________ Gwen Her. Dianah Field, M.D., ABFM., CAQSM. Primary Care and [place] Instructor of [place] of [facility] of Medicine

## 2020-09-23 NOTE — Assessment & Plan Note (Signed)
Wrist is doing well, no pain in the snuffbox, x-rays were unremarkable, PT continues to work on this, good motion, good strength, no areas of tenderness to palpation, return as needed.

## 2020-09-23 NOTE — Assessment & Plan Note (Signed)
Janet Clayton returns, she is a pleasant 35 year old female, she is now approximately 8 weeks post nondisplaced transverse right radial neck fracture, today she does not have any tenderness over the fracture, passively I can get her through full range of motion for extension, flexion, pronation, and supination though she reports some active supination and extension lag. She is continuing with physical therapy, I think we can go ahead and tentatively leave an open-ended follow-up from a MD perspective, and physical therapy can graduate her to an HEP. Return to see me as needed for this.

## 2020-09-23 NOTE — Therapy (Signed)
Janet Clayton, Janet Clayton, Janet Clayton Phone: [phone]   Fax:  [phone]  Physical Therapy Treatment  Patient Details  Name: Janet Clayton MRN: [id number] Date of Birth: 08-14-1985 Referring Provider (PT): Aundria Mems, MD   Encounter Date: 09/23/2020   PT End of Session - 09/23/20 0848    Visit Number 5    Number of Visits 12    Date for PT Re-Evaluation 10/09/20    Authorization Type BCBS-- 30 visits per year    Authorization - Visit Number 5    Authorization - Number of Visits 30    PT Start Time 0848    PT Stop Time 0926    PT Time Calculation (min) 38 min    Activity Tolerance Patient tolerated treatment well    Behavior During Therapy [facility] for tasks assessed/performed           Past Medical History:  Diagnosis Date  . Anxiety   . Depression   . Vaginal Pap smear, abnormal     Past Surgical History:  Procedure Laterality Date  . LAPAROSCOPIC BILATERAL SALPINGECTOMY Bilateral 06/07/2019   Procedure: LAPAROSCOPIC BILATERAL SALPINGECTOMY;  Surgeon: Emily Filbert, MD;  Location: [place];  Service: Gynecology;  Laterality: Bilateral;    There were no vitals filed for this visit.   Subjective Assessment - 09/23/20 0858    Subjective Pt reports she is doing her exercises 1x/day. AROM during day at work.   She is able to open bottles easier, but jars are still difficult.   Biggest complaint is that she is lacking rotation (supination)    Patient Stated Goals return to full use of R UE    Currently in Pain? No/denies              [facility] PT Assessment - 09/23/20 0001      Assessment   Medical Diagnosis Nondisplaced fracture of the right radius, wrist pain    Referring Provider (PT) Aundria Mems, MD    Onset Date/Surgical Date 07/26/20    Hand Dominance Right      AROM   Right Elbow Flexion 130    Right Elbow Extension -10    Right Forearm Pronation 82  Degrees    Right Forearm Supination 68 Degrees    Right Wrist Extension 75 Degrees    Right Wrist Flexion 67 Degrees      Strength   Right Hand Grip (lbs) 44    Right Hand Lateral Pinch 6 lbs    Left Hand Grip (lbs) 44    Left Hand Lateral Pinch 7.5 lbs               [facility] Adult PT Treatment/Exercise - 09/23/20 0001      Elbow Exercises   Elbow Flexion Strengthening;Right;10 reps   into supination, return to neutral   Bar Weights/Barbell (Elbow Flexion) 3 lbs    Forearm Supination Limitations 2#, 5 sec hold x 10 reps    Other elbow exercises UBE x 2 minutes forward/ 1 minute backward L2.    Other elbow exercises Rt radial deviation x 10 with 2#      Shoulder Exercises: Stretch   Other Shoulder Stretches wall Rt pec stretch x 2 reps of 30 sec,  Rt bicep brachii stretch at counter and doorway x 3 reps, 15 sec each.      Wrist Exercises   Other wrist exercises yellow flex bar bends x 10, then wringing x 10  sec x 10.      Manual Therapy   Soft tissue mobilization IASTM to Rt wrist and elbow flexors to decrease fascial restrictions.                       PT Long Term Goals - 09/23/20 0914      PT LONG TERM GOAL #1   Title The patient will be indep with HEP for R wrist, elbow and hand.    Time 6    Period Weeks    Status On-going      PT LONG TERM GOAL #2   Title The patient will improve functional status score from 49% to > or equal to 65%.    Time 6    Period Weeks    Status On-going      PT LONG TERM GOAL #3   Title The patient will improve forearm supination from -12 to 30 degrees supination.    Time 6    Period Weeks    Status Achieved      PT LONG TERM GOAL #4   Title The patient will return to opening jars, ADLs, meal prep, etc with full use of R UE.    Time 6    Period Weeks    Status Partially Met      PT LONG TERM GOAL #5   Title The patient will improve R elbow extension to full.    Baseline -10 degrees.    Time 6    Period Weeks     Status On-going                 Plan - 09/23/20 0912    Clinical Impression Statement Pt has had good improvement in Rt grip strength and Rt wrist/elbow ROM.  Continue ROM limitation in supination and elbow extension.  Pt has some discomfort with end range stretches, but no longer the pain she initially experienced. Pt has partally met her goals.    PT Treatment/Interventions ADLs/Self Care Home Management;Patient/family education;Therapeutic activities;Therapeutic exercise;Neuromuscular re-education;Taping;Dry needling;Cryotherapy;Iontophoresis 57m/ml Dexamethasone;Moist Heat;Manual techniques    PT Next Visit Plan AROM elbow and pronation/supination; UE/wrist strengthening as tolerated.  Progress to LTGs.    PT Home Exercise Plan Access Code: [email or web address]   Consulted and Agree with Plan of Care Patient           Patient will benefit from skilled therapeutic intervention in order to improve the following deficits and impairments:  Pain,Hypomobility,Impaired flexibility,Decreased strength,Decreased range of motion,Decreased activity tolerance  Visit Diagnosis: Pain in right wrist  Pain in right elbow  Muscle weakness (generalized)     Problem List Patient Active Problem List   Diagnosis Date Noted  . Right wrist injury 08/26/2020  . Fracture of radial neck, right, closed 07/29/2020  . CIN I (cervical intraepithelial neoplasia I) 07/17/2019  . Mild episode of recurrent major depressive disorder ([place] 07/05/2019  . Anxiety and depression 07/04/2019  . Elevated blood pressure reading 11/16/2018  . Morbidly obese ([place] 11/16/2018  . 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 HSV-2 (herpes simplex virus 2) infection 05/30/2018  . Dysplasia of cervix, low grade (CIN 1) 05/25/2018  . Genital warts 05/24/2018  . Inattention 02/18/2018  . Stress at work 02/18/2018  . Migraine 12/28/2017  . Vertigo 12/28/2017  . GAD (generalized anxiety disorder) 01/25/2017  . Adjustment reaction with  anxiety and depression 12/15/2016  . Sleep disturbances 12/15/2016  . Family history of Alzheimer's disease 12/15/2016  . Hyperlipidemia 06/22/2014  .  Dermatofibroma 06/19/2014  . Epidermal cyst 06/18/2014  . Obesity 06/18/2014  . Routine physical examination 06/18/2014   Kerin Perna, PTA 09/23/20 9:29 AM  [facility] [place], [place], [address] Phone: [phone]   Fax:  [phone]  Name: Cire Deyarmin MRN: [id number] Date of Birth: Sep 28, 1985

## 2020-09-25 ENCOUNTER — Encounter: Payer: Self-pay | Admitting: Rehabilitative and Restorative Service Providers"

## 2020-09-25 ENCOUNTER — Other Ambulatory Visit: Payer: Self-pay

## 2020-09-25 ENCOUNTER — Ambulatory Visit (INDEPENDENT_AMBULATORY_CARE_PROVIDER_SITE_OTHER): Payer: BC Managed Care – PPO | Admitting: Rehabilitative and Restorative Service Providers"

## 2020-09-25 DIAGNOSIS — M6281 Muscle weakness (generalized): Secondary | ICD-10-CM

## 2020-09-25 DIAGNOSIS — M25531 Pain in right wrist: Secondary | ICD-10-CM

## 2020-09-25 DIAGNOSIS — M25521 Pain in right elbow: Secondary | ICD-10-CM

## 2020-09-25 NOTE — Patient Instructions (Signed)
Access Code: [email or web address] URL: [email or web address] Date: 09/25/2020 Prepared by: Rudell Cobb  Program Notes elbow extension:  Sitting with arm propped on arm rest of sofa or chair, hold a weighted object (grocery bag, tool) and let arm rest in extended position with palm up.  Hold for 20 minutes while watching a show and rest when needed.   Exercises Seated Forearm Pronation and Supination with Hammer - 2 x daily - 7 x weekly - 1 sets - 5 reps - 10-15 seconds hold Standing Single Arm Elbow Flexion with Resistance - 2 x daily - 7 x weekly - 1 sets - 10 reps Standing Elbow Extension with Self-Anchored Resistance - 2 x daily - 7 x weekly - 1 sets - 10 reps Standing Anatomical Position with Scapular Retraction and Depression at Wall - 2 x daily - 7 x weekly - 1 sets - 10 reps Standing Bicep Stretch at Wall - 2 x daily - 7 x weekly - 1 sets - 10 reps Ulnar Nerve Flossing - 2 x daily - 7 x weekly - 1 sets - 10 reps

## 2020-09-25 NOTE — Therapy (Signed)
[place], [place], [address] Phone: [phone]   Fax:  [phone]  Physical Therapy Treatment  Patient Details  Name: Janet Clayton MRN: [id number] Date of Birth: 07-19-1985 Referring Provider (PT): Aundria Mems, MD   Encounter Date: 09/25/2020   PT End of Session - 09/25/20 0721    Visit Number 6    Number of Visits 12    Date for PT Re-Evaluation 10/09/20    Authorization Type BCBS-- 30 visits per year    Authorization - Visit Number 6    Authorization - Number of Visits 30    PT Start Time 0718    PT Stop Time 0758    PT Time Calculation (min) 40 min    Activity Tolerance Patient tolerated treatment well    Behavior During Therapy [facility] for tasks assessed/performed           Past Medical History:  Diagnosis Date  . Anxiety   . Depression   . Vaginal Pap smear, abnormal     Past Surgical History:  Procedure Laterality Date  . LAPAROSCOPIC BILATERAL SALPINGECTOMY Bilateral 06/07/2019   Procedure: LAPAROSCOPIC BILATERAL SALPINGECTOMY;  Surgeon: Emily Filbert, MD;  Location: [place];  Service: Gynecology;  Laterality: Bilateral;    There were no vitals filed for this visit.   Subjective Assessment - 09/25/20 0720    Subjective The patient notes she is not sore anymore.  She is limiting how much she lifts at work.    Patient Stated Goals return to full use of R UE    Currently in Pain? No/denies              [facility] PT Assessment - 09/25/20 0723      Assessment   Medical Diagnosis Nondisplaced fracture of the right radius, wrist pain    Referring Provider (PT) Aundria Mems, MD    Onset Date/Surgical Date 07/26/20                         [facility] Adult PT Treatment/Exercise - 09/25/20 0723      Exercises   Exercises Wrist;Hand;Elbow      Elbow Exercises   Elbow Flexion Strengthening;Both   12 reps   Bar Weights/Barbell (Elbow  Flexion) 3 lbs    Elbow Flexion Limitations standing x 2 sets    Elbow Extension Strengthening;Both   12 reps   Bar Weights/Barbell (Elbow Extension) 3 lbs    Forearm Supination PROM;AROM;Strengthening;Right;10 reps    Bar Weights/Barbell (Forearm Supination) 2 lbs    Forearm Pronation Strengthening;Right;10 reps   2 sets   Bar Weights/Barbell (Forearm Pronation) 2 lbs    Other elbow exercises UBE x 2.5 minutes (1 forward, 1.5 back)    Other elbow exercises theraband red bar with U bends x 12 reps      Shoulder Exercises: Stretch   Other Shoulder Stretches wall Rt pec stretch x 2 reps of 30 sec,  Rt bicep brachii stretch at counter and doorway x 3 reps, 15 sec each.      Manual Therapy   Manual Therapy Joint mobilization;Soft tissue mobilization    Manual therapy comments to reduce myofascial tightness    Joint Mobilization end range joint mobs radius/ulna with gait belt supporting elbow;    Soft tissue mobilization passive overpressure and STM/IASTM for soft tissue release  PT Education - 09/25/20 0800    Education Details HEP progression    Person(s) Educated Patient    Methods Explanation;Demonstration;Handout    Comprehension Verbalized understanding;Returned demonstration               PT Long Term Goals - 09/23/20 0914      PT LONG TERM GOAL #1   Title The patient will be indep with HEP for R wrist, elbow and hand.    Time 6    Period Weeks    Status On-going      PT LONG TERM GOAL #2   Title The patient will improve functional status score from 49% to > or equal to 65%.    Time 6    Period Weeks    Status On-going      PT LONG TERM GOAL #3   Title The patient will improve forearm supination from -12 to 30 degrees supination.    Time 6    Period Weeks    Status Achieved      PT LONG TERM GOAL #4   Title The patient will return to opening jars, ADLs, meal prep, etc with full use of R UE.    Time 6    Period Weeks    Status  Partially Met      PT LONG TERM GOAL #5   Title The patient will improve R elbow extension to full.    Baseline -10 degrees.    Time 6    Period Weeks    Status On-going                 Plan - 09/25/20 6606    Clinical Impression Statement The patient reports 80% improvement in R UE use.  PT is progressing joint mobilization, STM and strengthening to return to full use during work and home activities.    PT Treatment/Interventions ADLs/Self Care Home Management;Patient/family education;Therapeutic activities;Therapeutic exercise;Neuromuscular re-education;Taping;Dry needling;Cryotherapy;Iontophoresis 78m/ml Dexamethasone;Moist Heat;Manual techniques    PT Next Visit Plan joint mobilization radius/ulna, STM, isometric end range motor contraction for muscle re-ed, strengthening UE and elbow extension static stretches.    PT Home Exercise Plan Access Code: [email or web address]   Consulted and Agree with Plan of Care Patient           Patient will benefit from skilled therapeutic intervention in order to improve the following deficits and impairments:  Pain,Hypomobility,Impaired flexibility,Decreased strength,Decreased range of motion,Decreased activity tolerance  Visit Diagnosis: Pain in right wrist  Pain in right elbow  Muscle weakness (generalized)     Problem List Patient Active Problem List   Diagnosis Date Noted  . Right wrist injury 08/26/2020  . Fracture of radial neck, right, closed 07/29/2020  . CIN I (cervical intraepithelial neoplasia I) 07/17/2019  . Mild episode of recurrent major depressive disorder ([place] 07/05/2019  . Anxiety and depression 07/04/2019  . Elevated blood pressure reading 11/16/2018  . Morbidly obese ([place] 11/16/2018  . 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 HSV-2 (herpes simplex virus 2) infection 05/30/2018  . Dysplasia of cervix, low grade (CIN 1) 05/25/2018  . Genital warts 05/24/2018  . Inattention 02/18/2018  . Stress at work 02/18/2018  . Migraine  12/28/2017  . Vertigo 12/28/2017  . GAD (generalized anxiety disorder) 01/25/2017  . Adjustment reaction with anxiety and depression 12/15/2016  . Sleep disturbances 12/15/2016  . Family history of Alzheimer's disease 12/15/2016  . Hyperlipidemia 06/22/2014  . Dermatofibroma 06/19/2014  . Epidermal cyst 06/18/2014  . Obesity 06/18/2014  . Routine  physical examination 06/18/2014    Antonetta Clanton, PT 09/25/2020, 9:19 AM  [facility] [place], [place], [address] Phone: [phone]   Fax:  [phone]  Name: Janet Clayton MRN: [id number] Date of Birth: Nov 18, 1985

## 2020-09-30 ENCOUNTER — Encounter: Payer: BC Managed Care – PPO | Admitting: Physical Therapy

## 2020-10-03 ENCOUNTER — Ambulatory Visit (INDEPENDENT_AMBULATORY_CARE_PROVIDER_SITE_OTHER): Payer: BC Managed Care – PPO | Admitting: Rehabilitative and Restorative Service Providers"

## 2020-10-03 ENCOUNTER — Other Ambulatory Visit: Payer: Self-pay

## 2020-10-03 DIAGNOSIS — M25521 Pain in right elbow: Secondary | ICD-10-CM | POA: Diagnosis not present

## 2020-10-03 DIAGNOSIS — M25531 Pain in right wrist: Secondary | ICD-10-CM | POA: Diagnosis not present

## 2020-10-03 DIAGNOSIS — M6281 Muscle weakness (generalized): Secondary | ICD-10-CM

## 2020-10-03 NOTE — Therapy (Signed)
[place], [place], [address] Phone: [phone]   Fax:  [phone]  Physical Therapy Treatment and Discharge Summary  Patient Details  Name: Janet Clayton MRN: [id number] Date of Birth: [birth year] Referring Provider (PT): Aundria Mems, MD   Encounter Date: 10/03/2020   PHYSICAL THERAPY DISCHARGE SUMMARY  Visits from Start of Care: 7  Current functional level related to goals / functional outcomes: See goals below   Remaining deficits: First MTP flexor weakness --has HEP to address Occasional achiness in R forearm Mild decrease in R forearm supination   Education / Equipment: HEP, post d/c program.   Patient agrees to discharge. Patient goals were met. Patient is being discharged due to meeting rehab goals     PT End of Session - 10/03/20 1712     Visit Number 7    Number of Visits 12    Date for PT Re-Evaluation 10/09/20    Authorization Type [employer]-- 30 visits per year    Authorization - Visit Number 7    Authorization - Number of Visits 30    PT Start Time 7342    PT Stop Time 1744    PT Time Calculation (min) 40 min             Past Medical History:  Diagnosis Date   Anxiety    Depression    Vaginal Pap smear, abnormal     Past Surgical History:  Procedure Laterality Date   LAPAROSCOPIC BILATERAL SALPINGECTOMY Bilateral 06/07/2019   Procedure: LAPAROSCOPIC BILATERAL SALPINGECTOMY;  Surgeon: Emily Filbert, MD;  Location: [place];  Service: Gynecology;  Laterality: Bilateral;    There were no vitals filed for this visit.   Subjective Assessment - 10/03/20 1705     Subjective The patient is doing better with supination ROM.  She is holding the weight to stretch into supination.    Patient Stated Goals return to full use of R UE    Currently in Pain? Yes    Pain Onset More than a month ago    Pain Frequency Intermittent    Aggravating Factors   lifting heavy items    Pain Relieving Factors rest    Effect of Pain on Daily Activities noticing ease with feeding, brushing hair, still careful at work                [facility] PT Assessment - 10/03/20 1714       Assessment   Medical Diagnosis Nondisplaced fracture of the right radius, wrist pain    Referring Provider (PT) Aundria Mems, MD    Onset Date/Surgical Date 07/26/20    Hand Dominance Right      AROM   Right Elbow Extension -8   has equal carrying angle as compared to the left side   Right Forearm Supination 55 Degrees      Strength   Overall Strength Deficits    Right Hand Lateral Pinch 4 lbs    Left Hand Lateral Pinch 8 lbs                           [facility] Adult PT Treatment/Exercise - 10/03/20 1838       Exercises   Exercises Wrist;Hand;Elbow;Other Exercises    Other Exercises  wall push ups x 10 reps, stretcing into supination with overpressure      Elbow Exercises   Forearm Supination Strengthening;Right;10 reps    Theraband  Level (Supination) Level 2 (Red)    Forearm Pronation Strengthening;Right;10 reps    Theraband Level (Pronation) Level 2 (Red)      Hand Exercises   Other Hand Exercises first MTP flexion x 10 reps with rubber band resistance, isometrics x 10 reps and squeezing tennis ball for composite flexion                         PT Long Term Goals - 10/03/20 1736       PT LONG TERM GOAL #1   Title The patient will be indep with HEP for R wrist, elbow and hand.    Time 6    Period Weeks    Status Achieved      PT LONG TERM GOAL #2   Title The patient will improve functional status score from 49% to > or equal to 65%.    Baseline 72%    Time 6    Period Weeks    Status Achieved      PT LONG TERM GOAL #3   Title The patient will improve forearm supination from -12 to 30 degrees supination.    Baseline 55    Time 6    Period Weeks    Status Achieved      PT LONG TERM GOAL #4   Title The  patient will return to opening jars, ADLs, meal prep, etc with full use of R UE.    Time 6    Period Weeks    Status Achieved      PT LONG TERM GOAL #5   Title The patient will improve R elbow extension to full.    Baseline -10 degrees.    Time 6    Period Weeks    Status Achieved                   Plan - 10/03/20 1843     Clinical Impression Statement The patient reports continued improvement in R UE use feeling like it is getting back to normal with use for IADLs, cooking, without pain.  She does still avoid heavy lifting, and can get achiness at times.  She has met LTGs and PT modified HEP to address continued thumb flexion weakness (4 lb pincer grasp R compared to 8 lbs L).  Patient feels she can continue HEP to address remaining deficits.    PT Treatment/Interventions ADLs/Self Care Home Management;Patient/family education;Therapeutic activities;Therapeutic exercise;Neuromuscular re-education;Taping;Dry needling;Cryotherapy;Iontophoresis 80m/ml Dexamethasone;Moist Heat;Manual techniques    PT Next Visit Plan discharge    PT Home Exercise Plan Access Code: [email or web address]    Consulted and Agree with Plan of Care Patient             Patient will benefit from skilled therapeutic intervention in order to improve the following deficits and impairments:     Visit Diagnosis: Pain in right wrist  Pain in right elbow  Muscle weakness (generalized)     Problem List Patient Active Problem List   Diagnosis Date Noted   Right wrist injury 08/26/2020   Fracture of radial neck, right, closed 07/29/2020   CIN I (cervical intraepithelial neoplasia I) 07/17/2019   Mild episode of recurrent major depressive disorder ([place] 07/05/2019   Anxiety and depression 07/04/2019   Elevated blood pressure reading 11/16/2018   Morbidly obese ([place] 11/16/2018   Vulvodynia 11/14/2018   HSV-2 (herpes simplex virus 2) infection 05/30/2018   Dysplasia of cervix, low grade (CIN 1) 05/25/2018  Genital warts 05/24/2018   Inattention 02/18/2018   Stress at work 02/18/2018   Migraine 12/28/2017   Vertigo 12/28/2017   GAD (generalized anxiety disorder) 01/25/2017   Adjustment reaction with anxiety and depression 12/15/2016   Sleep disturbances 12/15/2016   Family history of Alzheimer's disease 12/15/2016   Hyperlipidemia 06/22/2014   Dermatofibroma 06/19/2014   Epidermal cyst 06/18/2014   Obesity 06/18/2014   Routine physical examination 06/18/2014    [place], PT  Davene Jobin 10/03/2020, 6:45 PM  [facility] [place], [place], [address] Phone: [phone]   Fax:  [phone]  Name: Janet Clayton MRN: [id number] Date of Birth: 1986/02/25

## 2020-10-03 NOTE — Patient Instructions (Signed)
Access Code: [email or web address] URL: [email or web address] Date: 10/03/2020 Prepared by: Rudell Cobb  Program Notes elbow extension:  Sitting with arm propped on arm rest of sofa or chair, hold a weighted object (grocery bag, tool) and let arm rest in extended position with palm up.  Hold for 20 minutes while watching a show and rest when needed.   Exercises Forearm Supination with Resistance - 2 x daily - 7 x weekly - 1 sets - 10 reps Standing Anatomical Position with Scapular Retraction and Depression at [place] - 2 x daily - 7 x weekly - 1 sets - 10 reps Standing Bicep Stretch at Wall - 2 x daily - 7 x weekly - 1 sets - 10 reps Thumb Flexion with Resistance - 2 x daily - 7 x weekly - 1 sets - 10 reps Seated Isometric Thumb Flexion - 2 x daily - 7 x weekly - 1 sets - 10 reps - 5 seconds hold Forearm Pronation and Supination with Hammer - 2 x daily - 7 x weekly - 1 sets - 10 reps Putty Squeezes - 2 x daily - 7 x weekly - 1 sets - 10 reps Finger Key Grip with Putty - 2 x daily - 7 x weekly - 1 sets - 10 reps Tip Pinch with Putty - 2 x daily - 7 x weekly - 1 sets - 10 reps

## 2020-10-05 ENCOUNTER — Other Ambulatory Visit: Payer: Self-pay | Admitting: Physician Assistant

## 2020-10-05 DIAGNOSIS — N94819 Vulvodynia, unspecified: Secondary | ICD-10-CM

## 2020-10-05 DIAGNOSIS — F330 Major depressive disorder, recurrent, mild: Secondary | ICD-10-CM

## 2020-10-07 ENCOUNTER — Encounter: Payer: BC Managed Care – PPO | Admitting: Rehabilitative and Restorative Service Providers"

## 2020-10-10 ENCOUNTER — Encounter: Payer: BC Managed Care – PPO | Admitting: Physical Therapy

## 2020-11-11 ENCOUNTER — Other Ambulatory Visit: Payer: Self-pay | Admitting: Physician Assistant

## 2020-11-11 DIAGNOSIS — F330 Major depressive disorder, recurrent, mild: Secondary | ICD-10-CM

## 2020-11-11 DIAGNOSIS — N94819 Vulvodynia, unspecified: Secondary | ICD-10-CM

## 2020-11-12 ENCOUNTER — Other Ambulatory Visit: Payer: Self-pay | Admitting: Physician Assistant

## 2020-11-12 ENCOUNTER — Encounter: Payer: Self-pay | Admitting: Physician Assistant

## 2020-11-12 DIAGNOSIS — N94819 Vulvodynia, unspecified: Secondary | ICD-10-CM

## 2020-11-12 DIAGNOSIS — F330 Major depressive disorder, recurrent, mild: Secondary | ICD-10-CM

## 2020-11-12 MED ORDER — DULOXETINE HCL 20 MG PO CPEP: 40.0000 mg | ORAL_CAPSULE | Freq: Every day | ORAL | 0 refills | Status: DC

## 2020-11-13 ENCOUNTER — Other Ambulatory Visit: Payer: Self-pay | Admitting: Physician Assistant

## 2020-11-13 DIAGNOSIS — N94819 Vulvodynia, unspecified: Secondary | ICD-10-CM

## 2020-11-13 DIAGNOSIS — F330 Major depressive disorder, recurrent, mild: Secondary | ICD-10-CM

## 2020-11-14 NOTE — Telephone Encounter (Signed)
Patient made an appt for August 3rd.

## 2020-11-14 NOTE — Telephone Encounter (Signed)
Patient needs an appt.

## 2020-11-14 NOTE — Telephone Encounter (Signed)
Called patient Janet Clayton to call and make an appt in order to get the Rx. Will try again shortly- tvt

## 2020-11-20 ENCOUNTER — Telehealth (INDEPENDENT_AMBULATORY_CARE_PROVIDER_SITE_OTHER): Payer: BC Managed Care – PPO | Admitting: Physician Assistant

## 2020-11-20 ENCOUNTER — Encounter: Payer: Self-pay | Admitting: Physician Assistant

## 2020-11-20 ENCOUNTER — Other Ambulatory Visit: Payer: Self-pay

## 2020-11-20 DIAGNOSIS — Z131 Encounter for screening for diabetes mellitus: Secondary | ICD-10-CM

## 2020-11-20 DIAGNOSIS — Z13220 Encounter for screening for lipoid disorders: Secondary | ICD-10-CM

## 2020-11-20 DIAGNOSIS — F411 Generalized anxiety disorder: Secondary | ICD-10-CM

## 2020-11-20 DIAGNOSIS — F330 Major depressive disorder, recurrent, mild: Secondary | ICD-10-CM

## 2020-11-20 DIAGNOSIS — N94819 Vulvodynia, unspecified: Secondary | ICD-10-CM

## 2020-11-20 DIAGNOSIS — G43009 Migraine without aura, not intractable, without status migrainosus: Secondary | ICD-10-CM | POA: Diagnosis not present

## 2020-11-20 DIAGNOSIS — Z79899 Other long term (current) drug therapy: Secondary | ICD-10-CM

## 2020-11-20 MED ORDER — DULOXETINE HCL 20 MG PO CPEP: 40.0000 mg | ORAL_CAPSULE | Freq: Every day | ORAL | 3 refills | Status: DC

## 2020-11-20 MED ORDER — RIZATRIPTAN BENZOATE 10 MG PO TABS
10.0000 mg | ORAL_TABLET | ORAL | 5 refills | Status: DC | PRN
Start: 2020-11-20 — End: 2022-01-12

## 2020-11-20 NOTE — Progress Notes (Signed)
..Virtual Visit via Video Note  I connected with Janet Clayton on 11/20/20 at  7:10 AM EDT by a video enabled telemedicine application and verified that I am speaking with the correct person using two identifiers.  Location: Patient: car Provider: clinic  .Marland KitchenParticipating in visit:  Patient: [name] Provider: Iran Planas PA-C   I discussed the limitations of evaluation and management by telemedicine and the availability of in person appointments. The patient expressed understanding and agreed to proceed.  History of Present Illness: Pt is a 35 yo female with GAD, MDD, migraines who presents to the clinic for medication refills.   She is doing really well. She just got a promotion at work. Her anxiety and depression are controlled. Her mother is not doing well but she is able to hold it together with medication. No side effects or concerns except "brain zaps" if she forgets medication. No SI/HC.   Migraines about 2-3 a month. Imitrex does not always help rescue. Usually induced by stress. Needs rescue medication.    .. Active Ambulatory Problems    Diagnosis Date Noted   Epidermal cyst 06/18/2014   Obesity 06/18/2014   Routine physical examination 06/18/2014   Dermatofibroma 06/19/2014   Hyperlipidemia 06/22/2014   Adjustment reaction with anxiety and depression 12/15/2016   Sleep disturbances 12/15/2016   Family history of Alzheimer's disease 12/15/2016   GAD (generalized anxiety disorder) 01/25/2017   Migraine 12/28/2017   Vertigo 12/28/2017   Inattention 02/18/2018   Stress at work 02/18/2018   Genital warts 05/24/2018   Dysplasia of cervix, low grade (CIN 1) 05/25/2018   HSV-2 (herpes simplex virus 2) infection 05/30/2018   Vulvodynia 11/14/2018   Elevated blood pressure reading 11/16/2018   Morbidly obese ([place]) 11/16/2018   Anxiety and depression 07/04/2019   Mild episode of recurrent major depressive disorder ([place]) 07/05/2019   CIN I (cervical intraepithelial  neoplasia I) 07/17/2019   Fracture of radial neck, right, closed 07/29/2020   Right wrist injury 08/26/2020   Resolved Ambulatory Problems    Diagnosis Date Noted   No Resolved Ambulatory Problems   Past Medical History:  Diagnosis Date   Anxiety    Depression    Vaginal Pap smear, abnormal     Observations/Objective: No acute distress Normal mood and appearance Normal breathing.   Marland Kitchen.There were no vitals filed for this visit. There is no height or weight on file to calculate BMI.   .. Depression screen [facility] 2/9 11/20/2020 07/04/2019 01/30/2019 01/30/2019 11/16/2018  Decreased Interest 0 0 0 0 0  Down, Depressed, Hopeless 0 '1 1 1 1  '$ PHQ - 2 Score 0 '1 1 1 1  '$ Altered sleeping 0 0 0 - 0  Tired, decreased energy '1 1 1 '$ - 1  Change in appetite 0 0 0 - 0  Feeling bad or failure about yourself  0 0 0 - 0  Trouble concentrating 0 2 1 - 1  Moving slowly or fidgety/restless 0 1 0 - 0  Suicidal thoughts 0 0 0 - 0  PHQ-9 Score '1 5 3 '$ - 3  Difficult doing work/chores Not difficult at all Somewhat difficult Somewhat difficult - Somewhat difficult   .Marland Kitchen GAD 7 : Generalized Anxiety Score 11/20/2020 07/04/2019 01/30/2019 11/16/2018  Nervous, Anxious, on Edge 0 '2 1 1  '$ Control/stop worrying 1 1 0 0  Worry too much - different things '1 1 1 1  '$ Trouble relaxing 0 0 1 1  Restless 0 0 0 0  Easily annoyed or irritable 0  0 0 0  Afraid - awful might happen 0 0 1 1  Total GAD 7 Score '2 4 4 4  '$ Anxiety Difficulty Not difficult at all Somewhat difficult Not difficult at all Somewhat difficult     Assessment and Plan: Marland KitchenMarland KitchenDiagnoses and all orders for this visit:  GAD (generalized anxiety disorder) -     DULoxetine (CYMBALTA) 20 MG capsule; Take 2 capsules (40 mg total) by mouth daily.  Mild episode of recurrent major depressive disorder (HCC) -     DULoxetine (CYMBALTA) 20 MG capsule; Take 2 capsules (40 mg total) by mouth daily.  Vulvodynia -     DULoxetine (CYMBALTA) 20 MG capsule; Take 2 capsules  (40 mg total) by mouth daily.  Migraine without aura and without status migrainosus, not intractable -     rizatriptan (MAXALT) 10 MG tablet; Take 1 tablet (10 mg total) by mouth as needed for migraine. May repeat in 2 hours if needed  Screening for diabetes mellitus -     COMPLETE METABOLIC PANEL WITH GFR -     Hemoglobin A1c  Medication management -     COMPLETE METABOLIC PANEL WITH GFR  Screening for lipid disorders -     Lipid Panel w/reflex Direct LDL  Needs labs for screening and medication management. Ordered today.   PHQ/GAD looking great. Refilled cymbalta.   Need for migraine rescue. Imitrex does not always work. Trial of maxalt.    Follow Up Instructions:    I discussed the assessment and treatment plan with the patient. The patient was provided an opportunity to ask questions and all were answered. The patient agreed with the plan and demonstrated an understanding of the instructions.   The patient was advised to call back or seek an in-person evaluation if the symptoms worsen or if the condition fails to improve as anticipated.    Iran Planas, PA-C

## 2020-11-27 ENCOUNTER — Telehealth: Payer: Self-pay | Admitting: *Deleted

## 2020-11-27 NOTE — Telephone Encounter (Signed)
Left patient a message to call and schedule annual with Dr. Gala Romney after 12/31/2020.

## 2021-01-08 ENCOUNTER — Ambulatory Visit: Payer: BC Managed Care – PPO | Admitting: Obstetrics & Gynecology

## 2021-02-03 ENCOUNTER — Ambulatory Visit: Payer: BC Managed Care – PPO | Admitting: Obstetrics & Gynecology

## 2021-02-13 ENCOUNTER — Encounter (INDEPENDENT_AMBULATORY_CARE_PROVIDER_SITE_OTHER): Payer: Self-pay

## 2021-02-17 ENCOUNTER — Other Ambulatory Visit (HOSPITAL_COMMUNITY)
Admission: RE | Admit: 2021-02-17 | Discharge: 2021-02-17 | Disposition: A | Discharge status: Home / Self Care | Payer: BC Managed Care – PPO | Source: Ambulatory Visit | Attending: Obstetrics & Gynecology | Admitting: Obstetrics & Gynecology

## 2021-02-17 ENCOUNTER — Other Ambulatory Visit: Payer: Self-pay

## 2021-02-17 ENCOUNTER — Ambulatory Visit (INDEPENDENT_AMBULATORY_CARE_PROVIDER_SITE_OTHER): Payer: BC Managed Care – PPO | Admitting: Obstetrics & Gynecology

## 2021-02-17 ENCOUNTER — Encounter: Payer: Self-pay | Admitting: Obstetrics & Gynecology

## 2021-02-17 VITALS — BP 130/89 | HR 85 | Ht 66.0 in | Wt 294.0 lb

## 2021-02-17 DIAGNOSIS — R87612 Low grade squamous intraepithelial lesion on cytologic smear of cervix (LGSIL): Secondary | ICD-10-CM | POA: Diagnosis not present

## 2021-02-17 DIAGNOSIS — R635 Abnormal weight gain: Secondary | ICD-10-CM | POA: Insufficient documentation

## 2021-02-17 DIAGNOSIS — Z1151 Encounter for screening for human papillomavirus (HPV): Secondary | ICD-10-CM | POA: Diagnosis not present

## 2021-02-17 DIAGNOSIS — Z01419 Encounter for gynecological examination (general) (routine) without abnormal findings: Secondary | ICD-10-CM | POA: Diagnosis not present

## 2021-02-17 DIAGNOSIS — Z79899 Other long term (current) drug therapy: Secondary | ICD-10-CM | POA: Diagnosis not present

## 2021-02-17 DIAGNOSIS — R87810 Cervical high risk human papillomavirus (HPV) DNA test positive: Secondary | ICD-10-CM | POA: Diagnosis not present

## 2021-02-17 DIAGNOSIS — Z131 Encounter for screening for diabetes mellitus: Secondary | ICD-10-CM | POA: Diagnosis not present

## 2021-02-17 DIAGNOSIS — Z13220 Encounter for screening for lipoid disorders: Secondary | ICD-10-CM | POA: Diagnosis not present

## 2021-02-17 LAB — TSH: TSH: 1.81 mIU/L

## 2021-02-17 NOTE — Progress Notes (Signed)
Subjective:     Janet Clayton is a 35 y.o. female here for a routine exam.  Current complaints: weight gain.     Gynecologic History No LMP recorded. Contraception: tubal ligation Last Pap: low grade in 2021 (CIN 1 on biopsy)  Last mammogram: never.   Obstetric History OB History  Gravida Para Term Preterm AB Living  0 0 0 0 0 0  SAB IAB Ectopic Multiple Live Births  0 0 0 0 0     The following portions of the patient's history were reviewed and updated as appropriate: allergies, current medications, past family history, past medical history, past social history, past surgical history, and problem list.  Review of Systems Pertinent items noted in HPI and remainder of comprehensive ROS otherwise negative.    Objective:     Vitals:   02/17/21 1000  BP: 130/89  Pulse: 85  Weight: 294 lb (133.4 kg)  Height: 5\' 6"  (1.676 m)   Vitals:  WNL General appearance: alert, cooperative and no distress  HEENT: Normocephalic, without obvious abnormality, atraumatic Eyes: negative Throat: lips, mucosa, and tongue normal; teeth and gums normal  Respiratory: Clear to auscultation bilaterally  CV: Regular rate and rhythm  Breasts:  Normal appearance, no masses or tenderness, no nipple retraction or dimpling  GI: Soft, non-tender; bowel sounds normal; no masses,  no organomegaly  GU: External Genitalia:  Tanner V, no lesion Urethra:  No prolapse   Vagina: Pink, normal rugae, no blood or discharge  Cervix: No CMT, no lesion  Uterus:  Normal size and contour, non tender, limited by habitus  Adnexa: Normal, no masses, non tender, limited by habitus  Musculoskeletal: No edema, redness or tenderness in the calves or thighs  Skin: Red scaly area peri nostril--sees dermatology   Lymphatic: Axillary adenopathy: none     Psychiatric: Normal mood and behavior        Assessment:    Healthy female exam.    Plan:    1.  Pap with cotesting 2.  Mammogram to begin as early as 28 with  joint decision making  3.  Return to derm--area around nose is not better 4.  Noom app and to f/u with PCP for weight loss; pt can't get appt with the weight loss management group until April or May.  Check TSH today.

## 2021-02-18 LAB — COMPLETE METABOLIC PANEL WITH GFR
AG Ratio: 1.7 (calc) (ref 1.0–2.5)
ALT: 14 U/L (ref 6–29)
AST: 17 U/L (ref 10–30)
Albumin: 4.1 g/dL (ref 3.6–5.1)
Alkaline phosphatase (APISO): 85 U/L (ref 31–125)
BUN: 12 mg/dL (ref 7–25)
CO2: 27 mmol/L (ref 20–32)
Calcium: 8.9 mg/dL (ref 8.6–10.2)
Chloride: 104 mmol/L (ref 98–110)
Creat: 0.73 mg/dL (ref 0.50–0.97)
Globulin: 2.4 g/dL (calc) (ref 1.9–3.7)
Glucose, Bld: 102 mg/dL — ABNORMAL HIGH (ref 65–99)
Potassium: 4.3 mmol/L (ref 3.5–5.3)
Sodium: 138 mmol/L (ref 135–146)
Total Bilirubin: 0.6 mg/dL (ref 0.2–1.2)
Total Protein: 6.5 g/dL (ref 6.1–8.1)
eGFR: 109 mL/min/{1.73_m2} (ref >=60)

## 2021-02-18 LAB — LIPID PANEL W/REFLEX DIRECT LDL
Cholesterol: 219 mg/dL — ABNORMAL HIGH (ref <200)
HDL: 43 mg/dL — ABNORMAL LOW (ref >=50)
LDL Cholesterol (Calc): 153 mg/dL (calc) — ABNORMAL HIGH
Non-HDL Cholesterol (Calc): 176 mg/dL (calc) — ABNORMAL HIGH (ref <130)
Total CHOL/HDL Ratio: 5.1 (calc) — ABNORMAL HIGH (ref <5.0)
Triglycerides: 115 mg/dL (ref <150)

## 2021-02-18 LAB — HEMOGLOBIN A1C
Hgb A1c MFr Bld: 5.2 % of total Hgb (ref <5.7)
Mean Plasma Glucose: 103 mg/dL
eAG (mmol/L): 5.7 mmol/L

## 2021-02-18 NOTE — Progress Notes (Signed)
[place],   5.2 is your A1C. No signs of diabetes but some evidence your fasting glucose is always a little elevated. Continue to keep a healthy low sugar diet and regular exercise.  Kidney and liver look great.  HDL, good cholesterol, low.  LDL, bad cholesterol, elevated.  No real change in cholesterol. I would recommend starting a statin as long as you are not wanting to pregnant. If you decided you wanted too then you would need to stop your statin while pregnant.

## 2021-02-19 ENCOUNTER — Encounter: Payer: Self-pay | Admitting: Physician Assistant

## 2021-02-19 DIAGNOSIS — R7301 Impaired fasting glucose: Secondary | ICD-10-CM

## 2021-02-19 DIAGNOSIS — E782 Mixed hyperlipidemia: Secondary | ICD-10-CM

## 2021-02-19 LAB — CYTOLOGY - PAP
Comment: NEGATIVE
High risk HPV: POSITIVE — AB

## 2021-03-17 ENCOUNTER — Encounter: Payer: Self-pay | Admitting: Obstetrics & Gynecology

## 2021-03-17 ENCOUNTER — Ambulatory Visit (INDEPENDENT_AMBULATORY_CARE_PROVIDER_SITE_OTHER): Payer: BC Managed Care – PPO | Admitting: Obstetrics & Gynecology

## 2021-03-17 ENCOUNTER — Other Ambulatory Visit (HOSPITAL_COMMUNITY)
Admission: RE | Admit: 2021-03-17 | Discharge: 2021-03-17 | Disposition: A | Discharge status: Home / Self Care | Payer: BC Managed Care – PPO | Source: Ambulatory Visit | Attending: Obstetrics & Gynecology | Admitting: Obstetrics & Gynecology

## 2021-03-17 ENCOUNTER — Other Ambulatory Visit: Payer: Self-pay

## 2021-03-17 VITALS — BP 140/92 | HR 87 | Resp 16 | Ht 66.0 in | Wt 291.0 lb

## 2021-03-17 DIAGNOSIS — N72 Inflammatory disease of cervix uteri: Secondary | ICD-10-CM | POA: Diagnosis not present

## 2021-03-17 DIAGNOSIS — R87810 Cervical high risk human papillomavirus (HPV) DNA test positive: Secondary | ICD-10-CM | POA: Diagnosis not present

## 2021-03-17 DIAGNOSIS — R87619 Unspecified abnormal cytological findings in specimens from cervix uteri: Secondary | ICD-10-CM | POA: Insufficient documentation

## 2021-03-17 DIAGNOSIS — N870 Mild cervical dysplasia: Secondary | ICD-10-CM | POA: Diagnosis not present

## 2021-03-17 DIAGNOSIS — R87612 Low grade squamous intraepithelial lesion on cytologic smear of cervix (LGSIL): Secondary | ICD-10-CM

## 2021-03-17 NOTE — Progress Notes (Signed)
Pt refused UPT due to bilateral salpingectomy

## 2021-03-17 NOTE — Progress Notes (Signed)
Colposcopy Procedure Note  Indications: Pap smear 1 months ago showed: low-grade squamous intraepithelial neoplasia (LGSIL - encompassing HPV,mild dysplasia,CIN I). The prior pap (2021)showed low-grade squamous intraepithelial neoplasia (LGSIL - encompassing HPV,mild dysplasia,CIN I) and in 202, ASCUS, HPV+  Prior cervical/vaginal disease: CIN 1. Prior cervical treatment: no treatment.  Procedure Details  The risks and benefits of the procedure and Written informed consent obtained.  Speculum placed in vagina and excellent visualization of cervix achieved, cervix swabbed x 3 with acetic acid solution.  Findings: Cervix: acetowhite lesion(s) noted at 12 and 6 o'clock o'clock; cervix swabbed with Lugol's solution, endocervical curettage performed, cervical biopsies taken at 12 and 6 o'clock, specimen labelled and sent to pathology, hemostasis achieved with Monsel's solution, and hemostasis achieved with silver nitrate. Vaginal inspection: vaginal colposcopy not performed. Vulvar colposcopy: vulvar colposcopy not performed.  Specimens: cervix biopsies at 12 and 6 and ECC  Complications: none.  Plan: Specimens labelled and sent to Pathology. Will base further treatment on Pathology findings. Post biopsy instructions given to patient.

## 2021-03-19 LAB — SURGICAL PATHOLOGY

## 2021-03-26 ENCOUNTER — Encounter: Payer: Self-pay | Admitting: Obstetrics & Gynecology

## 2021-03-27 ENCOUNTER — Other Ambulatory Visit: Payer: Self-pay | Admitting: Obstetrics & Gynecology

## 2021-03-27 MED ORDER — ALPRAZOLAM 0.5 MG PO TABS: ORAL_TABLET | ORAL | 0 refills | Status: DC

## 2021-04-07 ENCOUNTER — Other Ambulatory Visit: Payer: Self-pay

## 2021-04-07 ENCOUNTER — Other Ambulatory Visit (HOSPITAL_COMMUNITY)
Admission: RE | Admit: 2021-04-07 | Discharge: 2021-04-07 | Disposition: A | Discharge status: Home / Self Care | Payer: BC Managed Care – PPO | Source: Ambulatory Visit | Attending: Obstetrics & Gynecology | Admitting: Obstetrics & Gynecology

## 2021-04-07 ENCOUNTER — Ambulatory Visit (INDEPENDENT_AMBULATORY_CARE_PROVIDER_SITE_OTHER): Payer: BC Managed Care – PPO | Admitting: Obstetrics & Gynecology

## 2021-04-07 ENCOUNTER — Encounter: Payer: Self-pay | Admitting: Obstetrics & Gynecology

## 2021-04-07 VITALS — BP 136/92 | HR 92 | Ht 66.0 in | Wt 290.0 lb

## 2021-04-07 DIAGNOSIS — N870 Mild cervical dysplasia: Secondary | ICD-10-CM | POA: Diagnosis not present

## 2021-04-07 DIAGNOSIS — Z01812 Encounter for preprocedural laboratory examination: Secondary | ICD-10-CM | POA: Diagnosis not present

## 2021-04-07 DIAGNOSIS — N879 Dysplasia of cervix uteri, unspecified: Secondary | ICD-10-CM | POA: Diagnosis not present

## 2021-04-07 LAB — POCT URINE PREGNANCY: Preg Test, Ur: NEGATIVE

## 2021-04-07 NOTE — Addendum Note (Signed)
Addended by: Asencion Islam on: 04/07/2021 09:07 AM   Modules accepted: Orders

## 2021-04-07 NOTE — Progress Notes (Signed)
LEEP PROCEDURE NOTE Pap smear and colposcopy reviewed.   Persistent CIN 1 for >2 years  Risks, benefits, alternatives, and limitations of procedure explained to patient, including pain, bleeding, infection, failure to remove abnormal tissue and failure to cure dysplasia, need for repeat procedures, damage to pelvic organs, cervical incompetence.  Role of HPV,cervical dysplasia and need for close followup was empasized. Informed written consent was obtained. All questions were answered. Time out performed.  ??Procedure: The patient was placed in lithotomy position and the bivalved coated speculum was placed in the patient's vagina. A grounding pad placed on the patient. Lugol's solution was applied to the cervix and areas of decreased uptake were noted 6 & 12 o'clock.   Local anesthesia was administered via an intracervical block using 10cc of 2% Lidocaine with epinephrine. The suction was turned on and the Small 1X Fisher Cone Biopsy Excisor on 6 Watts of cutting current was used to excise the area of decreased uptake and excise the entire transformation zone. Excellent hemostasis was achieved using roller ball coagulation set at 50 Watts coagulation current. Monsel's solution was then applied and the speculum was removed from the vagina. Specimens were sent to pathology. ?The patient tolerated the procedure well. Post-operative instructions given to patient, including instruction to seek medical attention for persistent bright red bleeding, fever, abdominal/pelvic pain, dysuria, nausea or vomiting. She was also told about the possibility of having copious yellow to black tinged discharge. She was counseled to avoid anything in the vagina (sex/douching/tampons) for 4 weeks. She has a  3 week post-operative check to review results and assess wound healing.

## 2021-04-10 LAB — SURGICAL PATHOLOGY

## 2021-04-28 ENCOUNTER — Other Ambulatory Visit: Payer: Self-pay

## 2021-04-28 ENCOUNTER — Ambulatory Visit (INDEPENDENT_AMBULATORY_CARE_PROVIDER_SITE_OTHER): Payer: BC Managed Care – PPO | Admitting: Obstetrics & Gynecology

## 2021-04-28 ENCOUNTER — Encounter: Payer: Self-pay | Admitting: Obstetrics & Gynecology

## 2021-04-28 VITALS — BP 144/105 | HR 85 | Ht 66.0 in | Wt 292.0 lb

## 2021-04-28 DIAGNOSIS — R030 Elevated blood-pressure reading, without diagnosis of hypertension: Secondary | ICD-10-CM | POA: Diagnosis not present

## 2021-04-28 DIAGNOSIS — N870 Mild cervical dysplasia: Secondary | ICD-10-CM | POA: Diagnosis not present

## 2021-04-28 NOTE — Progress Notes (Signed)
° °  Subjective:    Patient ID: Janet Clayton, female    DOB: January 02, 1986, 36 y.o.   MRN: [id number]  HPI  36 year old female presents status post LEEP.  Patient has done well after the procedure.  She had a little bit of discharge with with is expected.  It went away after her last menstrual cycle.  Pathology was reviewed today.  Review of Systems  Constitutional: Negative.   Respiratory: Negative.    Cardiovascular: Negative.   Gastrointestinal: Negative.   Genitourinary: Negative.       Objective:   Physical Exam Vitals reviewed.  Constitutional:      General: She is not in acute distress.    Appearance: She is well-developed.  HENT:     Head: Normocephalic and atraumatic.  Eyes:     Conjunctiva/sclera: Conjunctivae normal.  Cardiovascular:     Rate and Rhythm: Normal rate.  Pulmonary:     Effort: Pulmonary effort is normal.  Skin:    General: Skin is warm and dry.  Neurological:     Mental Status: She is alert and oriented to person, place, and time.  Psychiatric:        Mood and Affect: Mood normal.   Vitals:   04/28/21 0819  BP: (!) 144/105  Pulse: 85  Weight: 292 lb (132.5 kg)  Height: 5\' 6"  (1.676 m)      Assessment & Plan:  36 year old female with CIN-1 on pathology and whitecoat hypertension  Patient is blood pressure is normal when she goes to her primary care provider.  Pelvic exams are painful for her which causes her blood pressure to be high in anticipation. CIN-1 on LEEP specimen with negative margins.  Repeat Pap smear 1 year.

## 2021-08-07 DIAGNOSIS — S4352XA Sprain of left acromioclavicular joint, initial encounter: Secondary | ICD-10-CM | POA: Diagnosis not present

## 2021-08-07 DIAGNOSIS — M4722 Other spondylosis with radiculopathy, cervical region: Secondary | ICD-10-CM | POA: Diagnosis not present

## 2021-08-07 DIAGNOSIS — M47816 Spondylosis without myelopathy or radiculopathy, lumbar region: Secondary | ICD-10-CM | POA: Diagnosis not present

## 2021-08-07 DIAGNOSIS — M47818 Spondylosis without myelopathy or radiculopathy, sacral and sacrococcygeal region: Secondary | ICD-10-CM | POA: Diagnosis not present

## 2021-08-20 DIAGNOSIS — D224 Melanocytic nevi of scalp and neck: Secondary | ICD-10-CM | POA: Diagnosis not present

## 2021-08-20 DIAGNOSIS — L820 Inflamed seborrheic keratosis: Secondary | ICD-10-CM | POA: Diagnosis not present

## 2021-08-23 ENCOUNTER — Other Ambulatory Visit: Payer: Self-pay | Admitting: Physician Assistant

## 2021-08-23 DIAGNOSIS — F330 Major depressive disorder, recurrent, mild: Secondary | ICD-10-CM

## 2021-08-23 DIAGNOSIS — F411 Generalized anxiety disorder: Secondary | ICD-10-CM

## 2021-08-23 DIAGNOSIS — N94819 Vulvodynia, unspecified: Secondary | ICD-10-CM

## 2021-09-27 ENCOUNTER — Other Ambulatory Visit: Payer: Self-pay | Admitting: Physician Assistant

## 2021-09-27 DIAGNOSIS — N94819 Vulvodynia, unspecified: Secondary | ICD-10-CM

## 2021-09-27 DIAGNOSIS — F411 Generalized anxiety disorder: Secondary | ICD-10-CM

## 2021-09-27 DIAGNOSIS — F330 Major depressive disorder, recurrent, mild: Secondary | ICD-10-CM

## 2021-11-26 ENCOUNTER — Encounter (INDEPENDENT_AMBULATORY_CARE_PROVIDER_SITE_OTHER): Payer: Self-pay

## 2022-01-10 ENCOUNTER — Other Ambulatory Visit: Payer: Self-pay | Admitting: Physician Assistant

## 2022-01-10 DIAGNOSIS — F411 Generalized anxiety disorder: Secondary | ICD-10-CM

## 2022-01-10 DIAGNOSIS — F330 Major depressive disorder, recurrent, mild: Secondary | ICD-10-CM

## 2022-01-10 DIAGNOSIS — N94819 Vulvodynia, unspecified: Secondary | ICD-10-CM

## 2022-01-12 ENCOUNTER — Other Ambulatory Visit: Payer: Self-pay | Admitting: Physician Assistant

## 2022-01-12 DIAGNOSIS — G43009 Migraine without aura, not intractable, without status migrainosus: Secondary | ICD-10-CM

## 2022-02-07 ENCOUNTER — Other Ambulatory Visit: Payer: Self-pay | Admitting: Physician Assistant

## 2022-02-07 DIAGNOSIS — F330 Major depressive disorder, recurrent, mild: Secondary | ICD-10-CM

## 2022-02-07 DIAGNOSIS — N94819 Vulvodynia, unspecified: Secondary | ICD-10-CM

## 2022-02-07 DIAGNOSIS — F411 Generalized anxiety disorder: Secondary | ICD-10-CM

## 2022-02-09 ENCOUNTER — Other Ambulatory Visit: Payer: Self-pay | Admitting: Physician Assistant

## 2022-02-09 DIAGNOSIS — F411 Generalized anxiety disorder: Secondary | ICD-10-CM

## 2022-02-09 DIAGNOSIS — N94819 Vulvodynia, unspecified: Secondary | ICD-10-CM

## 2022-02-09 DIAGNOSIS — F330 Major depressive disorder, recurrent, mild: Secondary | ICD-10-CM

## 2022-02-12 ENCOUNTER — Other Ambulatory Visit: Payer: Self-pay | Admitting: Physician Assistant

## 2022-02-12 DIAGNOSIS — G43009 Migraine without aura, not intractable, without status migrainosus: Secondary | ICD-10-CM

## 2022-02-13 ENCOUNTER — Telehealth (INDEPENDENT_AMBULATORY_CARE_PROVIDER_SITE_OTHER): Payer: BC Managed Care – PPO | Admitting: Physician Assistant

## 2022-02-13 ENCOUNTER — Encounter: Payer: Self-pay | Admitting: Physician Assistant

## 2022-02-13 DIAGNOSIS — Z131 Encounter for screening for diabetes mellitus: Secondary | ICD-10-CM

## 2022-02-13 DIAGNOSIS — F411 Generalized anxiety disorder: Secondary | ICD-10-CM | POA: Diagnosis not present

## 2022-02-13 DIAGNOSIS — Z1329 Encounter for screening for other suspected endocrine disorder: Secondary | ICD-10-CM

## 2022-02-13 DIAGNOSIS — F330 Major depressive disorder, recurrent, mild: Secondary | ICD-10-CM

## 2022-02-13 DIAGNOSIS — E782 Mixed hyperlipidemia: Secondary | ICD-10-CM

## 2022-02-13 DIAGNOSIS — N94819 Vulvodynia, unspecified: Secondary | ICD-10-CM

## 2022-02-13 DIAGNOSIS — G43009 Migraine without aura, not intractable, without status migrainosus: Secondary | ICD-10-CM

## 2022-02-13 MED ORDER — RIZATRIPTAN BENZOATE 10 MG PO TABS: 10.0000 mg | ORAL_TABLET | Freq: Every day | ORAL | 5 refills | Status: DC | PRN

## 2022-02-13 MED ORDER — DULOXETINE HCL 20 MG PO CPEP: 40.0000 mg | ORAL_CAPSULE | Freq: Every day | ORAL | 3 refills | Status: DC

## 2022-02-13 NOTE — Progress Notes (Signed)
..Virtual Visit via Video Note  I connected with Janet Clayton on 02/13/22 at  3:00 PM EDT by a video enabled telemedicine application and verified that I am speaking with the correct person using two identifiers.  Location: Patient: home Provider: clinic  .Marland KitchenParticipating in visit:  Patient: Janet Clayton Provider: Iran Planas PA-C  I discussed the limitations of evaluation and management by telemedicine and the availability of in person appointments. The patient expressed understanding and agreed to proceed.  History of Present Illness: Pt is a 36 yo female with MDD, GAD, vulvodyna who needs medications refilled.   Her mother has alzheimer's and putting a lot of extra stress on the family taking care of her. Overall she feels like she is doing well. No concerns. No SI/HC. Work is going Engineer, manufacturing. Migraines a a few a month. Maxalt helps for rescue.   .. Active Ambulatory Problems    Diagnosis Date Noted   Epidermal cyst 06/18/2014   Dermatofibroma 06/19/2014   Hyperlipidemia 06/22/2014   Adjustment reaction with anxiety and depression 12/15/2016   Sleep disturbances 12/15/2016   Family history of Alzheimer's disease 12/15/2016   GAD (generalized anxiety disorder) 01/25/2017   Migraine 12/28/2017   Vertigo 12/28/2017   Inattention 02/18/2018   Stress at work 02/18/2018   Dysplasia of cervix, low grade (CIN 1) 05/25/2018   HSV-2 (herpes simplex virus 2) infection 05/30/2018   Vulvodynia 11/14/2018   Elevated blood pressure reading 11/16/2018   Morbidly obese ([place]) 11/16/2018   Anxiety and depression 07/04/2019   Mild episode of recurrent major depressive disorder ([place]) 07/05/2019   CIN I (cervical intraepithelial neoplasia I) 07/17/2019   Right wrist injury 08/26/2020   Resolved Ambulatory Problems    Diagnosis Date Noted   Obesity 06/18/2014   Routine physical examination 06/18/2014   Genital warts 05/24/2018   Fracture of radial neck, right, closed 07/29/2020   Past  Medical History:  Diagnosis Date   Anxiety    Depression    Vaginal Pap smear, abnormal      Observations/Objective:  No acute distress Normal mood and appearance Normal breathing  Will call with BP reading.    ..    02/13/2022    2:26 PM 11/20/2020    7:30 AM 07/04/2019    8:33 AM 01/30/2019    3:21 PM 01/30/2019    3:18 PM  Depression screen PHQ 2/9  Decreased Interest 1 0 0 0 0  Down, Depressed, Hopeless 1 0 '1 1 1  '$ PHQ - 2 Score 2 0 '1 1 1  '$ Altered sleeping 2 0 0 0   Tired, decreased energy '1 1 1 1   '$ Change in appetite 1 0 0 0   Feeling bad or failure about yourself  1 0 0 0   Trouble concentrating 1 0 2 1   Moving slowly or fidgety/restless 1 0 1 0   Suicidal thoughts 0 0 0 0   PHQ-9 Score '9 1 5 3   '$ Difficult doing work/chores Not difficult at all Not difficult at all Somewhat difficult Somewhat difficult    ..    02/13/2022    2:25 PM 11/20/2020    7:29 AM 07/04/2019    8:33 AM 01/30/2019    3:20 PM  GAD 7 : Generalized Anxiety Score  Nervous, Anxious, on Edge 3 0 2 1  Control/stop worrying '2 1 1 '$ 0  Worry too much - different things '2 1 1 1  '$ Trouble relaxing 1 0 0 1  Restless 0 0 0  0  Easily annoyed or irritable 0 0 0 0  Afraid - awful might happen 1 0 0 1  Total GAD 7 Score '9 2 4 4  '$ Anxiety Difficulty Not difficult at all Not difficult at all Somewhat difficult Not difficult at all    Assessment and Plan: Marland KitchenMarland KitchenDiagnoses and all orders for this visit:  Mild episode of recurrent major depressive disorder (HCC) -     DULoxetine (CYMBALTA) 20 MG capsule; Take 2 capsules (40 mg total) by mouth daily.  Migraine without aura and without status migrainosus, not intractable -     rizatriptan (MAXALT) 10 MG tablet; Take 1 tablet (10 mg total) by mouth daily as needed for migraine (may repeat 2 hrs PRN, no more than 2 in 24 hrs). -     CBC w/Diff/Platelet  Vulvodynia -     DULoxetine (CYMBALTA) 20 MG capsule; Take 2 capsules (40 mg total) by mouth daily.  GAD  (generalized anxiety disorder) -     DULoxetine (CYMBALTA) 20 MG capsule; Take 2 capsules (40 mg total) by mouth daily.  Mixed hyperlipidemia -     Lipid Panel w/reflex Direct LDL -     CBC w/Diff/Platelet  Screening for diabetes mellitus -     COMPLETE METABOLIC PANEL WITH GFR  Thyroid disorder screening -     TSH  Morbidly obese (HCC)   GAD/PHQ no concerns Refilled cymbalta Migraines controlled Refilled maxalt for rescue Fasting labs ordered Pap-followed by GYN. Follow up in 1 year.    Follow Up Instructions:    I discussed the assessment and treatment plan with the patient. The patient was provided an opportunity to ask questions and all were answered. The patient agreed with the plan and demonstrated an understanding of the instructions.   The patient was advised to call back or seek an in-person evaluation if the symptoms worsen or if the condition fails to improve as anticipated.  Iran Planas, PA-C

## 2022-02-23 ENCOUNTER — Encounter: Payer: Self-pay | Admitting: Obstetrics & Gynecology

## 2022-02-23 ENCOUNTER — Ambulatory Visit (INDEPENDENT_AMBULATORY_CARE_PROVIDER_SITE_OTHER): Payer: BC Managed Care – PPO | Admitting: Obstetrics & Gynecology

## 2022-02-23 ENCOUNTER — Other Ambulatory Visit (HOSPITAL_COMMUNITY)
Admission: RE | Admit: 2022-02-23 | Discharge: 2022-02-23 | Disposition: A | Discharge status: Home / Self Care | Payer: BC Managed Care – PPO | Source: Ambulatory Visit | Attending: Obstetrics & Gynecology | Admitting: Obstetrics & Gynecology

## 2022-02-23 VITALS — BP 139/90 | HR 90 | Resp 16 | Ht 66.0 in | Wt 293.0 lb

## 2022-02-23 DIAGNOSIS — Z01419 Encounter for gynecological examination (general) (routine) without abnormal findings: Secondary | ICD-10-CM

## 2022-02-23 DIAGNOSIS — Z23 Encounter for immunization: Secondary | ICD-10-CM

## 2022-02-23 DIAGNOSIS — Z113 Encounter for screening for infections with a predominantly sexual mode of transmission: Secondary | ICD-10-CM

## 2022-02-23 NOTE — Progress Notes (Signed)
Subjective:     Janet Clayton is a 36 y.o. female here for a routine exam.  Current complaints: less vaginal pain than before.  Is sexually active without pain.  Relationship is complicated and agrees to STD testing.    Gynecologic History Patient's last menstrual period was 02/16/2022. Contraception: tubal ligation Last Mammogram: Never Last Pap Smear:  10/22 Last Colon Screening;  Never Seat Belts:   Yes Sun Screen:   Yes Dental Check Up:  Yes Brush & Floss:  Yes     Obstetric History OB History  Gravida Para Term Preterm AB Living  0 0 0 0 0 0  SAB IAB Ectopic Multiple Live Births  0 0 0 0 0     The following portions of the patient's history were reviewed and updated as appropriate: allergies, current medications, past family history, past medical history, past social history, past surgical history, and problem list.  Review of Systems Pertinent items noted in HPI and remainder of comprehensive ROS otherwise negative.    Objective:     Vitals:   02/23/22 0934  BP: (!) 139/90  Pulse: 90  Resp: 16  Weight: 293 lb (132.9 kg)  Height: '5\' 6"'$  (1.676 m)   Vitals:  WNL General appearance: alert, cooperative and no distress  HEENT: Normocephalic, without obvious abnormality, atraumatic Eyes: negative Throat: lips, mucosa, and tongue normal; teeth and gums normal  Respiratory: Clear to auscultation bilaterally  CV: Regular rate and rhythm  Breasts:  Normal appearance, no masses or tenderness, no nipple retraction or dimpling  GI: Soft, non-tender; bowel sounds normal; no masses,  no organomegaly  GU: External Genitalia:  Tanner V, no lesion Urethra:  No prolapse   Vagina: Pink, normal rugae, no blood or discharge  Cervix: No CMT, no lesion  Uterus:  Normal size and contour, non tender  Adnexa: Normal, no masses, non tender  Musculoskeletal: No edema, redness or tenderness in the calves or thighs  Skin: No lesions or rash  Lymphatic: Axillary adenopathy: none      Psychiatric: Normal mood and behavior        Assessment:    Healthy female exam.    Plan:   Pap smear--Hx of prolonged LSIL s/p LEEP STD testing per our conversation Screening mammogram at 27. BTL for contraception

## 2022-02-23 NOTE — Progress Notes (Signed)
Last Mammogram: Never Last Pap Smear:  10/22 Last Colon Screening;  Never Seat Belts:   Yes Sun Screen:   Yes Dental Check Up:  Yes Brush & Floss:  Yes

## 2022-02-25 LAB — URINE CYTOLOGY ANCILLARY ONLY
Chlamydia: NEGATIVE
Comment: NEGATIVE
Comment: NEGATIVE
Comment: NORMAL
Neisseria Gonorrhea: NEGATIVE
Trichomonas: NEGATIVE

## 2022-02-26 DIAGNOSIS — E782 Mixed hyperlipidemia: Secondary | ICD-10-CM | POA: Diagnosis not present

## 2022-02-26 DIAGNOSIS — Z131 Encounter for screening for diabetes mellitus: Secondary | ICD-10-CM | POA: Diagnosis not present

## 2022-02-26 DIAGNOSIS — G43009 Migraine without aura, not intractable, without status migrainosus: Secondary | ICD-10-CM | POA: Diagnosis not present

## 2022-02-26 DIAGNOSIS — Z01419 Encounter for gynecological examination (general) (routine) without abnormal findings: Secondary | ICD-10-CM | POA: Diagnosis not present

## 2022-02-26 DIAGNOSIS — R7309 Other abnormal glucose: Secondary | ICD-10-CM | POA: Diagnosis not present

## 2022-02-26 LAB — CYTOLOGY - PAP
Adequacy: ABSENT
Comment: NEGATIVE
Diagnosis: NEGATIVE
High risk HPV: NEGATIVE

## 2022-02-27 LAB — HIV ANTIBODY (ROUTINE TESTING W REFLEX): HIV 1&2 Ab, 4th Generation: NONREACTIVE

## 2022-02-27 LAB — HEPATITIS B SURFACE ANTIGEN: Hepatitis B Surface Ag: NONREACTIVE

## 2022-02-27 LAB — HEPATITIS C ANTIBODY: Hepatitis C Ab: NONREACTIVE

## 2022-02-27 LAB — RPR: RPR Ser Ql: NONREACTIVE

## 2022-02-27 NOTE — Progress Notes (Signed)
Janet Clayton,   Glucose is elevated. Will order A1C to better evaluate.  Thyroid looks great.  Kidney and liver look good.  Cholesterol is about the same from years past.  Optimal LDL is under 100 but with low risk under 160. You are just under that. Continue to make diet and lifestyle changes and we continue to make recommendations based on risk.

## 2022-02-28 LAB — LIPID PANEL W/REFLEX DIRECT LDL
Cholesterol: 220 mg/dL — ABNORMAL HIGH (ref <200)
HDL: 49 mg/dL — ABNORMAL LOW (ref >=50)
LDL Cholesterol (Calc): 149 mg/dL (calc) — ABNORMAL HIGH
Non-HDL Cholesterol (Calc): 171 mg/dL (calc) — ABNORMAL HIGH (ref <130)
Total CHOL/HDL Ratio: 4.5 (calc) (ref <5.0)
Triglycerides: 104 mg/dL (ref <150)

## 2022-02-28 LAB — COMPLETE METABOLIC PANEL WITH GFR
AG Ratio: 1.7 (calc) (ref 1.0–2.5)
ALT: 14 U/L (ref 6–29)
AST: 18 U/L (ref 10–30)
Albumin: 3.9 g/dL (ref 3.6–5.1)
Alkaline phosphatase (APISO): 91 U/L (ref 31–125)
BUN: 10 mg/dL (ref 7–25)
CO2: 27 mmol/L (ref 20–32)
Calcium: 9 mg/dL (ref 8.6–10.2)
Chloride: 106 mmol/L (ref 98–110)
Creat: 0.67 mg/dL (ref 0.50–0.97)
Globulin: 2.3 g/dL (calc) (ref 1.9–3.7)
Glucose, Bld: 114 mg/dL — ABNORMAL HIGH (ref 65–99)
Potassium: 4.3 mmol/L (ref 3.5–5.3)
Sodium: 142 mmol/L (ref 135–146)
Total Bilirubin: 0.4 mg/dL (ref 0.2–1.2)
Total Protein: 6.2 g/dL (ref 6.1–8.1)
eGFR: 115 mL/min/{1.73_m2} (ref >=60)

## 2022-02-28 LAB — CBC WITH DIFFERENTIAL/PLATELET
Absolute Monocytes: 350 cells/uL (ref 200–950)
Basophils Absolute: 79 cells/uL (ref 0–200)
Basophils Relative: 1.2 %
Eosinophils Absolute: 218 cells/uL (ref 15–500)
Eosinophils Relative: 3.3 %
HCT: 42.9 % (ref 35.0–45.0)
Hemoglobin: 14.3 g/dL (ref 11.7–15.5)
Lymphs Abs: 1927 cells/uL (ref 850–3900)
MCH: 29 pg (ref 27.0–33.0)
MCHC: 33.3 g/dL (ref 32.0–36.0)
MCV: 87 fL (ref 80.0–100.0)
MPV: 10.6 fL (ref 7.5–12.5)
Monocytes Relative: 5.3 %
Neutro Abs: 4026 cells/uL (ref 1500–7800)
Neutrophils Relative %: 61 %
Platelets: 246 10*3/uL (ref 140–400)
RBC: 4.93 10*6/uL (ref 3.80–5.10)
RDW: 12.2 % (ref 11.0–15.0)
Total Lymphocyte: 29.2 %
WBC: 6.6 10*3/uL (ref 3.8–10.8)

## 2022-02-28 LAB — TSH: TSH: 1.73 mIU/L

## 2022-02-28 LAB — HEMOGLOBIN A1C W/OUT EAG: Hgb A1c MFr Bld: 5.6 % of total Hgb (ref <5.7)

## 2022-03-02 ENCOUNTER — Encounter: Payer: Self-pay | Admitting: Obstetrics & Gynecology

## 2022-03-02 NOTE — Progress Notes (Signed)
A1C is rising some but not to pre-diabetes level yet. Continue to try to get 150 minutes of exercise a week and limit sugars/carbs/processed/fatty foods. Recheck in 1 year.

## 2022-03-03 ENCOUNTER — Other Ambulatory Visit: Payer: Self-pay | Admitting: *Deleted

## 2022-03-03 MED ORDER — METRONIDAZOLE 500 MG PO TABS: 500.0000 mg | ORAL_TABLET | Freq: Two times a day (BID) | ORAL | 0 refills | Status: DC

## 2022-03-14 ENCOUNTER — Other Ambulatory Visit: Payer: Self-pay | Admitting: Physician Assistant

## 2022-03-14 DIAGNOSIS — F330 Major depressive disorder, recurrent, mild: Secondary | ICD-10-CM

## 2022-03-14 DIAGNOSIS — F411 Generalized anxiety disorder: Secondary | ICD-10-CM

## 2022-03-14 DIAGNOSIS — N94819 Vulvodynia, unspecified: Secondary | ICD-10-CM

## 2022-04-25 IMAGING — DX DG WRIST COMPLETE 3+V*R*
4 series · 4 of 4 positions shown · non-contrast
Comparison: None.

CLINICAL DATA: Fall.  Wrist pain

EXAM:
RIGHT WRIST - COMPLETE 3+ VIEW

[wrist pa]
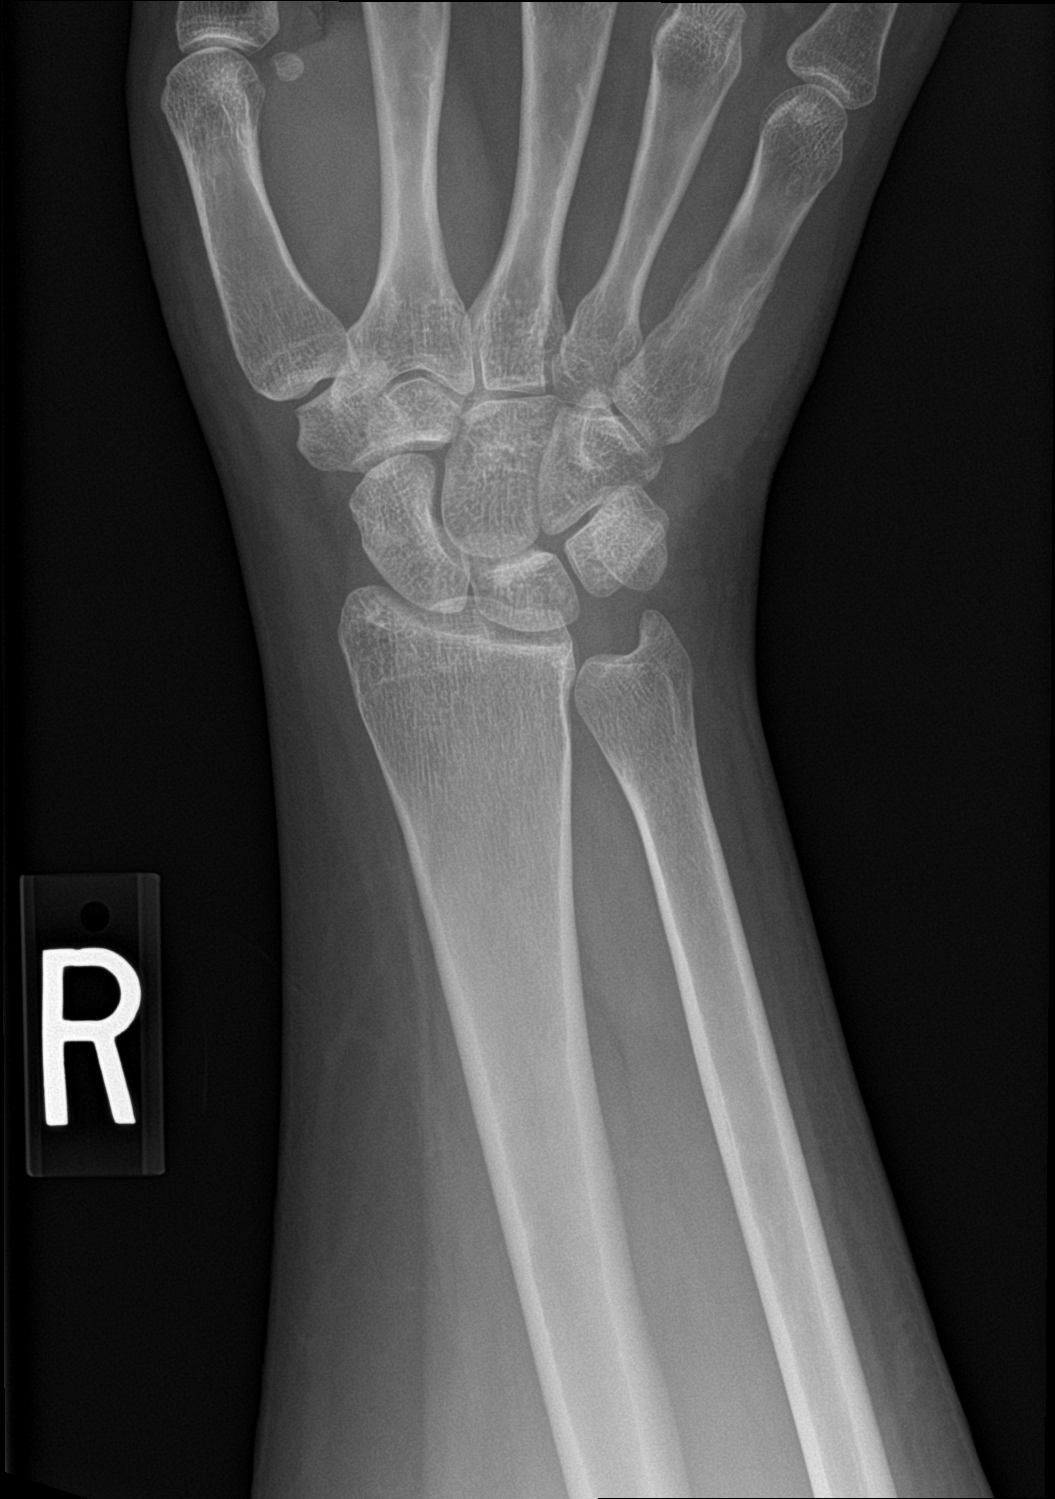

[wrist obl]
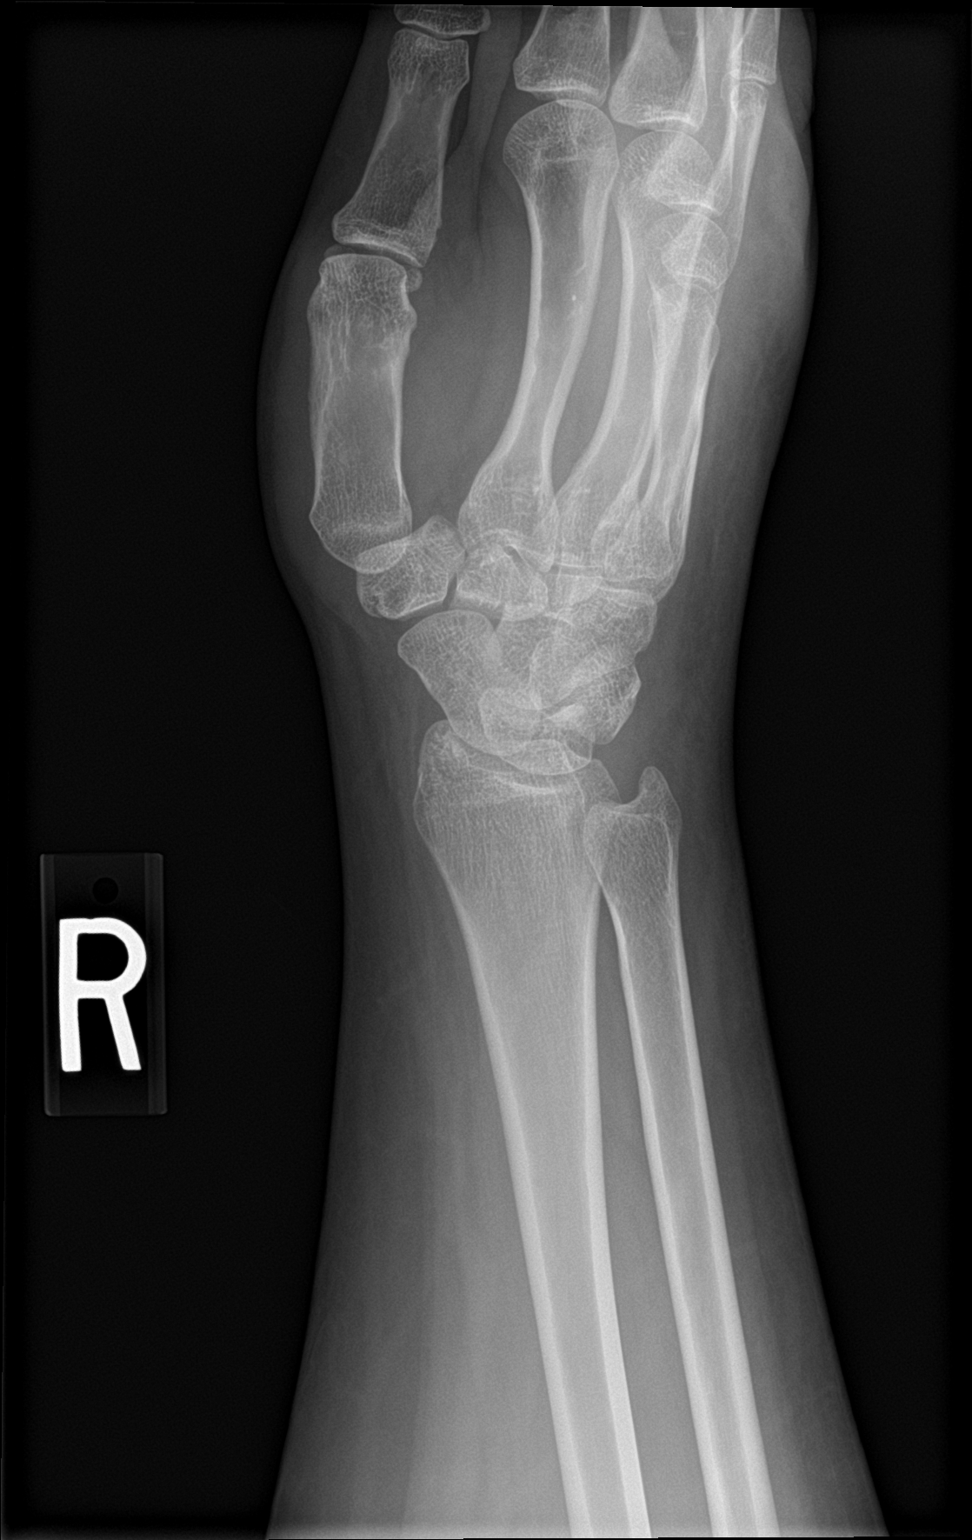

[wrist lat]
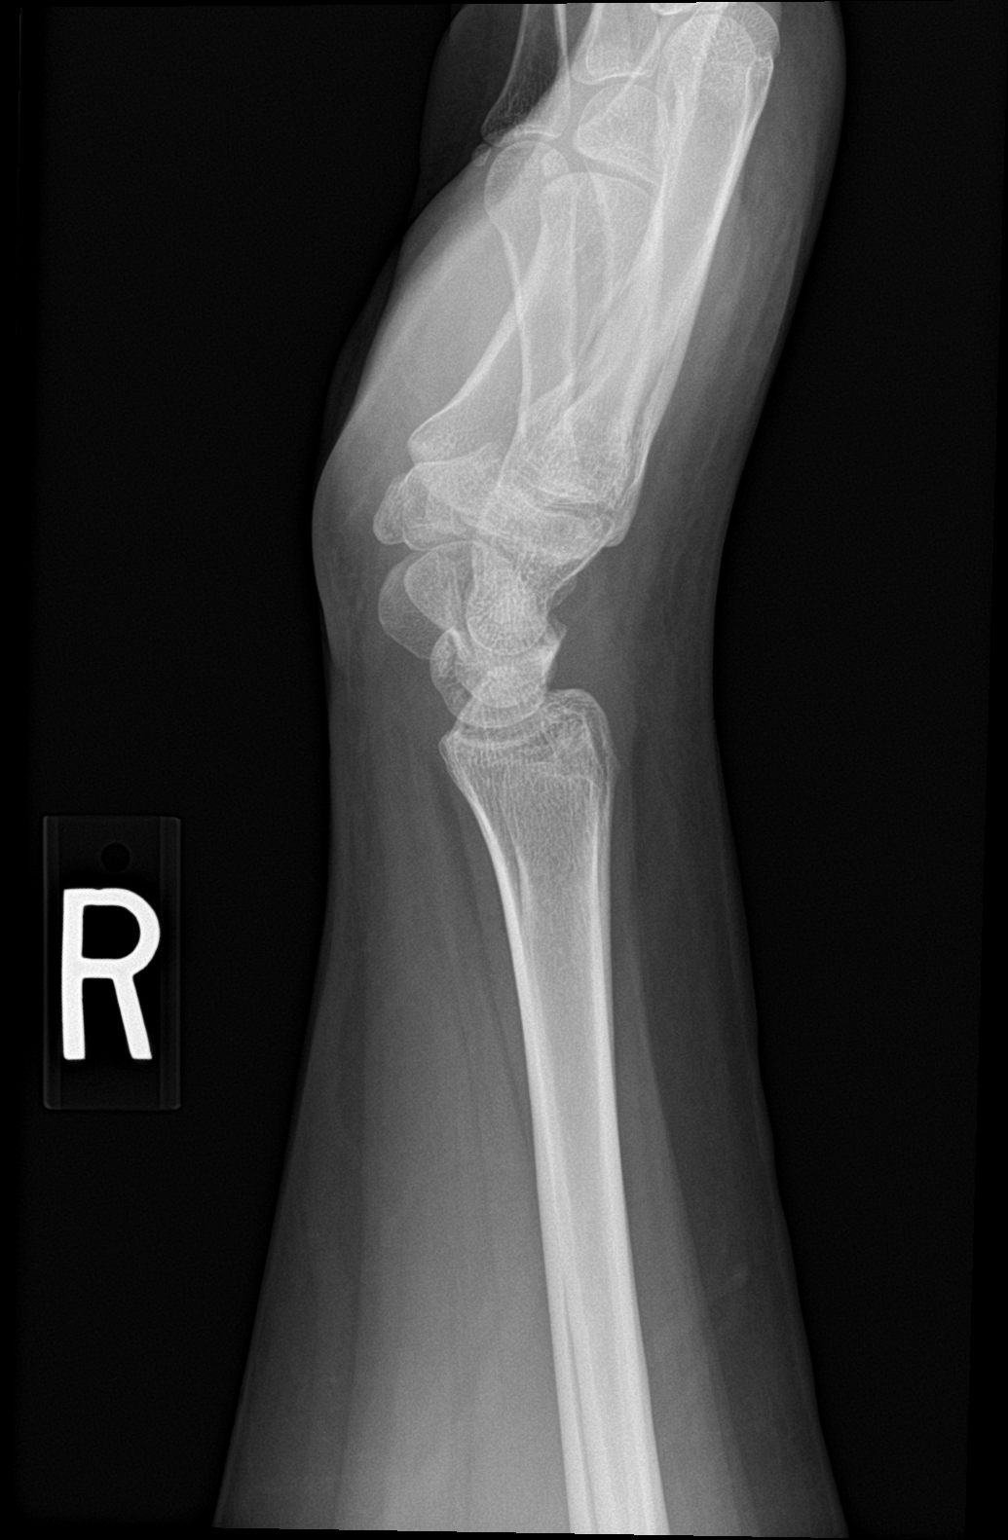

[wrist navicular]
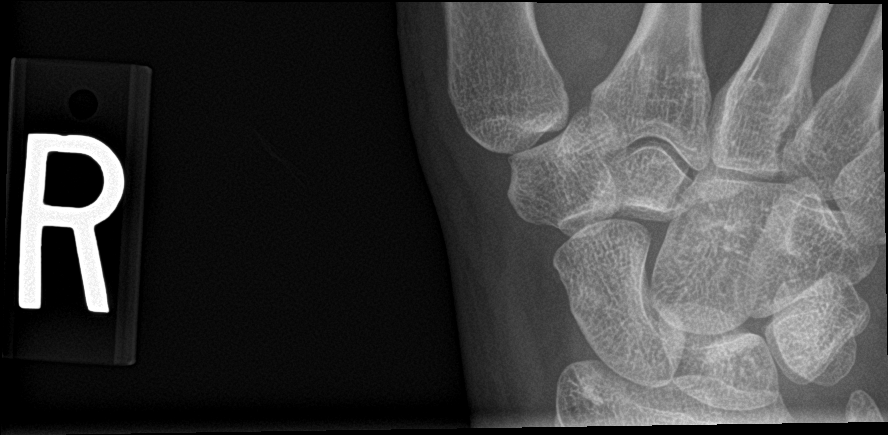

[4 of 4 positions shown; findings below may reference images not displayed]

FINDINGS: No distal radius or ulnar fracture. Radiocarpal joint is intact. No
carpal fracture. No soft tissue abnormality.
IMPRESSION: No fracture or dislocation.

## 2022-05-16 ENCOUNTER — Other Ambulatory Visit: Payer: Self-pay

## 2022-05-16 ENCOUNTER — Ambulatory Visit
Admission: RE | Admit: 2022-05-16 | Discharge: 2022-05-16 | Disposition: A | Discharge status: Home / Self Care | Payer: BC Managed Care – PPO | Source: Ambulatory Visit | Attending: Emergency Medicine | Admitting: Emergency Medicine

## 2022-05-16 VITALS — BP 136/88 | HR 82 | Temp 97.8°F | Resp 16 | Ht 67.0 in | Wt 292.0 lb

## 2022-05-16 DIAGNOSIS — H66001 Acute suppurative otitis media without spontaneous rupture of ear drum, right ear: Secondary | ICD-10-CM

## 2022-05-16 DIAGNOSIS — T3695XA Adverse effect of unspecified systemic antibiotic, initial encounter: Secondary | ICD-10-CM | POA: Diagnosis not present

## 2022-05-16 DIAGNOSIS — B379 Candidiasis, unspecified: Secondary | ICD-10-CM | POA: Diagnosis not present

## 2022-05-16 MED ORDER — CEFDINIR 300 MG PO CAPS: 300.0000 mg | ORAL_CAPSULE | Freq: Two times a day (BID) | ORAL | 0 refills | Status: AC

## 2022-05-16 MED ORDER — FEXOFENADINE HCL 180 MG PO TABS: 180.0000 mg | ORAL_TABLET | Freq: Every day | ORAL | 1 refills | Status: DC

## 2022-05-16 MED ORDER — FLUCONAZOLE 150 MG PO TABS: ORAL_TABLET | ORAL | 0 refills | Status: DC

## 2022-05-16 MED ORDER — PSEUDOEPHEDRINE HCL 30 MG PO TABS: 60.0000 mg | ORAL_TABLET | Freq: Three times a day (TID) | ORAL | 0 refills | Status: AC | PRN

## 2022-05-16 NOTE — ED Provider Notes (Signed)
[name]    CSN: [id number] Arrival date & time: 05/16/22  0845    HISTORY   Chief Complaint  Patient presents with   Dizziness    When I tilt my head to the left, or lay on  my left side I get very dizzy - Entered by patient   HPI Janet Clayton is a pleasant, 37 y.o. female who presents to urgent care today. Patient complains of a 5-day history of right ear pain and dizziness.  Patient states when she tilts her head to the left or lays on her left side she gets very dizzy.  Patient reports a history of allergies in the past.  Patient states she also frequently gets sinus infections and bronchitis.  States she has not had allergy symptoms in "a while" since she started her new job, states her previous job was located in a building that has mold.  The history is provided by the patient.   Past Medical History:  Diagnosis Date   Anxiety    Depression    Vaginal Pap smear, abnormal    Patient Active Problem List   Diagnosis Date Noted   CIN I (cervical intraepithelial neoplasia I) 07/17/2019   Mild episode of recurrent major depressive disorder ([place]) 07/05/2019   Anxiety and depression 07/04/2019   Elevated blood pressure reading 11/16/2018   Morbidly obese ([place]) 11/16/2018   Vulvodynia 11/14/2018   Dysplasia of cervix, low grade (CIN 1) 05/25/2018   Migraine 12/28/2017   GAD (generalized anxiety disorder) 01/25/2017   Hyperlipidemia 06/22/2014   Dermatofibroma 06/19/2014   Past Surgical History:  Procedure Laterality Date   LAPAROSCOPIC BILATERAL SALPINGECTOMY Bilateral 06/07/2019   Procedure: LAPAROSCOPIC BILATERAL SALPINGECTOMY;  Surgeon: Emily Filbert, MD;  Location: [facility];  Service: Gynecology;  Laterality: Bilateral;   LEEP     OB History     Gravida  0   Para  0   Term  0   Preterm  0   AB  0   Living  0      SAB  0   IAB  0   Ectopic  0   Multiple  0   Live Births  0          Home Medications     Prior to Admission medications   Medication Sig Start Date End Date Taking? Authorizing Provider  metroNIDAZOLE (FLAGYL) 500 MG tablet Take 1 tablet (500 mg total) by mouth 2 (two) times daily. 03/03/22   Guss Bunde, MD  DULoxetine (CYMBALTA) 20 MG capsule Take 2 capsules (40 mg total) by mouth daily. 02/13/22   Breeback, Jade L, PA-C  rizatriptan (MAXALT) 10 MG tablet Take 1 tablet (10 mg total) by mouth daily as needed for migraine (may repeat 2 hrs PRN, no more than 2 in 24 hrs). 03/05/22   Breeback, Jade L, PA-C  SUMAtriptan (IMITREX) 50 MG tablet TAKE 1 TABLET BY MOUTH EVERY 2 HOURS AS NEEDED FOR MIGRAINE. MAY REPEAT IN 2 HOURS IF HEADACHE PERSISTS OR RECURS 07/04/19 02/13/22  Donella Stade, PA-C    Family History Family History  Problem Relation Age of Onset   Alzheimer's disease Mother    Social History Social History   Tobacco Use   Smoking status: Never   Smokeless tobacco: Never  Vaping Use   Vaping Use: Never used  Substance Use Topics   Alcohol use: Yes    Alcohol/week: 0.0 standard drinks of alcohol    Comment:  rare   Drug use: No   Allergies   Sulfamethoxazole-trimethoprim, Amoxicillin-pot clavulanate, Codeine, Doxycycline, and Sulfa antibiotics  Review of Systems Review of Systems Pertinent findings revealed after performing a 14 point review of systems has been noted in the history of present illness.  Physical Exam Triage Vital Signs ED Triage Vitals  Enc Vitals Group     BP 02/14/21 0827 (!) 147/82     Pulse Rate 02/14/21 0827 72     Resp 02/14/21 0827 18     Temp 02/14/21 0827 98.3 F (36.8 C)     Temp Source 02/14/21 0827 Oral     SpO2 02/14/21 0827 98 %     Weight --      Height --      Head Circumference --      Peak Flow --      Pain Score 02/14/21 0826 5     Pain Loc --      Pain Edu? --      Excl. in [place]? --   No data found.  Updated Vital Signs BP 136/88 (BP Location: Left Arm)   Pulse 82   Temp 97.8 F (36.6 C) (Oral)    Resp 16   Ht '5\' 7"'$  (1.702 m)   Wt 292 lb (132.5 kg)   SpO2 96%   BMI 45.73 kg/m   Physical Exam Vitals and nursing note reviewed.  Constitutional:      General: She is not in acute distress.    Appearance: Normal appearance. She is not ill-appearing.  HENT:     Head: Normocephalic and atraumatic.     Salivary Glands: Right salivary gland is not diffusely enlarged or tender. Left salivary gland is not diffusely enlarged or tender.     Right Ear: Hearing, ear canal and external ear normal. No drainage. A middle ear effusion is present. There is no impacted cerumen. Tympanic membrane is bulging. Tympanic membrane is not injected or erythematous.     Left Ear: Hearing, ear canal and external ear normal. No drainage.  No middle ear effusion. There is no impacted cerumen. Tympanic membrane is bulging. Tympanic membrane is not injected or erythematous.     Ears:     Comments: Right EAC with erythema    Nose: Rhinorrhea present. No nasal deformity, septal deviation, signs of injury, nasal tenderness, mucosal edema or congestion. Rhinorrhea is clear.     Right Nostril: Occlusion present. No foreign body, epistaxis or septal hematoma.     Left Nostril: Occlusion present. No foreign body, epistaxis or septal hematoma.     Right Turbinates: Enlarged, swollen and pale.     Left Turbinates: Enlarged, swollen and pale.     Right Sinus: No maxillary sinus tenderness or frontal sinus tenderness.     Left Sinus: No maxillary sinus tenderness or frontal sinus tenderness.     Mouth/Throat:     Lips: Pink. No lesions.     Mouth: Mucous membranes are moist. No oral lesions.     Pharynx: Oropharynx is clear. Uvula midline. No posterior oropharyngeal erythema or uvula swelling.     Tonsils: No tonsillar exudate or tonsillar abscesses. 0 on the right. 0 on the left.     Comments: Postnasal drip Eyes:     General: Lids are normal.        Right eye: No discharge.        Left eye: No discharge.      Extraocular Movements: Extraocular movements intact.  Conjunctiva/sclera: Conjunctivae normal.     Right eye: Right conjunctiva is not injected.     Left eye: Left conjunctiva is not injected.  Neck:     Trachea: Trachea and phonation normal.  Cardiovascular:     Rate and Rhythm: Normal rate and regular rhythm.     Pulses: Normal pulses.     Heart sounds: Normal heart sounds. No murmur heard.    No friction rub. No gallop.  Pulmonary:     Effort: Pulmonary effort is normal. No accessory muscle usage, prolonged expiration or respiratory distress.     Breath sounds: Normal breath sounds. No stridor, decreased air movement or transmitted upper airway sounds. No decreased breath sounds, wheezing, rhonchi or rales.  Chest:     Chest wall: No tenderness.  Musculoskeletal:        General: Normal range of motion.     Cervical back: Normal range of motion and neck supple. Normal range of motion.  Lymphadenopathy:     Cervical: No cervical adenopathy.  Skin:    General: Skin is warm and dry.     Findings: No erythema or rash.  Neurological:     General: No focal deficit present.     Mental Status: She is alert and oriented to person, place, and time.  Psychiatric:        Mood and Affect: Mood normal.        Behavior: Behavior normal.     Visual Acuity Right Eye Distance:   Left Eye Distance:   Bilateral Distance:    Right Eye Near:   Left Eye Near:    Bilateral Near:     UC Couse / Diagnostics / Procedures:     Radiology No results found.  Procedures Procedures (including critical care time) EKG  Pending results:  Labs Reviewed - No data to display  Medications Ordered in UC: Medications - No data to display  UC Diagnoses / Final Clinical Impressions(s)   I have reviewed the triage vital signs and the nursing notes.  Pertinent labs & imaging results that were available during my care of the patient were reviewed by me and considered in my medical decision making  (see chart for details).    Final diagnoses:  Acute suppurative otitis media of right ear without spontaneous rupture of tympanic membrane, recurrence not specified  Antibiotic-induced yeast infection   Patient provided with a 10-day course of cefdinir for empiric treatment of presumed otitis media in her right inner ear.  Patient requested Diflucan for inevitable vaginal yeast infection caused by taking 10 days of antibiotics.  Patient provided with allergy medications to address amatory inflammation, ibuprofen 400 mg 3 times daily also advised.  Sudafed recommended to help dry up clear rhinorrhea.  Return precautions advised.  Please see discharge instructions below for details of plan of care as provided to patient. ED Prescriptions     Medication Sig Dispense Auth. Provider   cefdinir (OMNICEF) 300 MG capsule Take 1 capsule (300 mg total) by mouth 2 (two) times daily for 10 days. 20 capsule Lynden Oxford Scales, PA-C   fluconazole (DIFLUCAN) 150 MG tablet Take 1 tablet on day 4 of antibiotics.  Take second tablet 3 days later. 2 tablet Lynden Oxford Scales, PA-C   fexofenadine (ALLEGRA) 180 MG tablet Take 1 tablet (180 mg total) by mouth daily. 90 tablet Lynden Oxford Scales, PA-C   pseudoephedrine (SUDAFED) 30 MG tablet Take 2 tablets (60 mg total) by mouth every 8 (eight) hours as needed  for up to 3 days for congestion. 18 tablet Lynden Oxford Scales, PA-C      PDMP not reviewed this encounter.  Pending results:  Labs Reviewed - No data to display  Discharge Instructions:   Discharge Instructions      Please read below to learn more about the medications, dosages and frequencies that I recommend to help alleviate your symptoms and to get you feeling better soon:   Omnicef (cefdinir):  1 capsule twice daily for 10 days, you can take it with or without food.  This antibiotic can cause upset stomach, this will resolve once antibiotics are complete.  You are welcome to use  a probiotic, eat yogurt, take Imodium while taking this medication.  Please avoid other systemic medications such as Maalox, Pepto-Bismol or milk of magnesia as they can interfere with your body's ability to absorb the antibiotics.       Diflucan (fluconazole): As you may or may not be aware, taking antibiotics can often cause patients to develop a vaginal yeast infection.  For this reason, I have provided you with a prescription for Diflucan, and antifungal medication used to treat vaginal yeast infections.  Please take the first Diflucan tablet on day 3 or 4 of your antibiotic therapy, and take the second Diflucan tablet 3 days later.  You do not need to pick up this prescription or take this medication unless you develop symptoms of vaginal yeast infection including thick, white vaginal discharge and/or vaginal itching.  This prescription has been provided as a Manufacturing engineer and for your convenience.   Allegra (fexofenadine): This is an excellent second-generation antihistamine that helps to reduce respiratory inflammatory response to environmental allergens.  This medication is not known to cause daytime sleepiness so it can be taken in the daytime.  If you find that it does make you sleepy, please feel free to take it at bedtime.   Advil, Motrin (ibuprofen): This is a good anti-inflammatory medication which not only addresses aches, pains but also significantly reduces soft tissue inflammation of the upper airways that causes sinus and nasal congestion as well as inflammation of the lower airways which makes you feel like your breathing is constricted or your cough feel tight.  I recommend that you take 400 mg every 8 hours as needed.      Sudafed (pseudoephedrine): This is a decongestant.  This medication has to be purchased from the pharmacist counter, I recommend taking 2 tablets, 60 mg, 2-3 times a day as needed to relieve runny nose and sinus drainage.  This medication is available over-the-counter  however I have sent a prescription for your convenience.   Please follow-up within the next 5-7 days either with your primary care provider or urgent care if your symptoms do not resolve.  If you do not have a primary care provider, we will assist you in finding one.        Thank you for visiting urgent care today.  We appreciate the opportunity to participate in your care.       Disposition Upon Discharge:  Condition: stable for discharge home  Patient presented with an acute illness with associated systemic symptoms and significant discomfort requiring urgent management. In my opinion, this is a condition that a prudent lay person (someone who possesses an average knowledge of health and medicine) may potentially expect to result in complications if not addressed urgently such as respiratory distress, impairment of bodily function or dysfunction of bodily organs.   Routine symptom specific, illness  specific and/or disease specific instructions were discussed with the patient and/or caregiver at length.   As such, the patient has been evaluated and assessed, work-up was performed and treatment was provided in alignment with urgent care protocols and evidence based medicine.  Patient/parent/caregiver has been advised that the patient may require follow up for further testing and treatment if the symptoms continue in spite of treatment, as clinically indicated and appropriate.  Patient/parent/caregiver has been advised to return to the [facility] or PCP if no better; to PCP or the Emergency Department if new signs and symptoms develop, or if the current signs or symptoms continue to change or worsen for further workup, evaluation and treatment as clinically indicated and appropriate  The patient will follow up with their current PCP if and as advised. If the patient does not currently have a PCP we will assist them in obtaining one.   The patient may need specialty follow up if the symptoms continue,  in spite of conservative treatment and management, for further workup, evaluation, consultation and treatment as clinically indicated and appropriate.  Patient/parent/caregiver verbalized understanding and agreement of plan as discussed.  All questions were addressed during visit.  Please see discharge instructions below for further details of plan.  This office note has been dictated using Museum/gallery curator.  Unfortunately, this method of dictation can sometimes lead to typographical or grammatical errors.  I apologize for your inconvenience in advance if this occurs.  Please do not hesitate to reach out to me if clarification is needed.      Lynden Oxford Scales, [place] 05/16/22 [phone]

## 2022-05-16 NOTE — Discharge Instructions (Addendum)
Please read below to learn more about the medications, dosages and frequencies that I recommend to help alleviate your symptoms and to get you feeling better soon:   Omnicef (cefdinir):  1 capsule twice daily for 10 days, you can take it with or without food.  This antibiotic can cause upset stomach, this will resolve once antibiotics are complete.  You are welcome to use a probiotic, eat yogurt, take Imodium while taking this medication.  Please avoid other systemic medications such as Maalox, Pepto-Bismol or milk of magnesia as they can interfere with your body's ability to absorb the antibiotics.       Diflucan (fluconazole): As you may or may not be aware, taking antibiotics can often cause patients to develop a vaginal yeast infection.  For this reason, I have provided you with a prescription for Diflucan, and antifungal medication used to treat vaginal yeast infections.  Please take the first Diflucan tablet on day 3 or 4 of your antibiotic therapy, and take the second Diflucan tablet 3 days later.  You do not need to pick up this prescription or take this medication unless you develop symptoms of vaginal yeast infection including thick, white vaginal discharge and/or vaginal itching.  This prescription has been provided as a Manufacturing engineer and for your convenience.   Allegra (fexofenadine): This is an excellent second-generation antihistamine that helps to reduce respiratory inflammatory response to environmental allergens.  This medication is not known to cause daytime sleepiness so it can be taken in the daytime.  If you find that it does make you sleepy, please feel free to take it at bedtime.   Advil, Motrin (ibuprofen): This is a good anti-inflammatory medication which not only addresses aches, pains but also significantly reduces soft tissue inflammation of the upper airways that causes sinus and nasal congestion as well as inflammation of the lower airways which makes you feel like your breathing is  constricted or your cough feel tight.  I recommend that you take 400 mg every 8 hours as needed.      Sudafed (pseudoephedrine): This is a decongestant.  This medication has to be purchased from the pharmacist counter, I recommend taking 2 tablets, 60 mg, 2-3 times a day as needed to relieve runny nose and sinus drainage.  This medication is available over-the-counter however I have sent a prescription for your convenience.   Please follow-up within the next 5-7 days either with your primary care provider or urgent care if your symptoms do not resolve.  If you do not have a primary care provider, we will assist you in finding one.        Thank you for visiting urgent care today.  We appreciate the opportunity to participate in your care.

## 2022-05-16 NOTE — ED Triage Notes (Signed)
Right ear pain and dizziness onset for five days

## 2022-05-23 IMAGING — DX DG ELBOW COMPLETE 3+V*R*
4 series · 4 of 4 positions shown · non-contrast
Comparison: 07/29/2020

CLINICAL DATA: Recheck radial neck fracture.

EXAM:
RIGHT ELBOW - COMPLETE 3+ VIEW

[elbow ap]
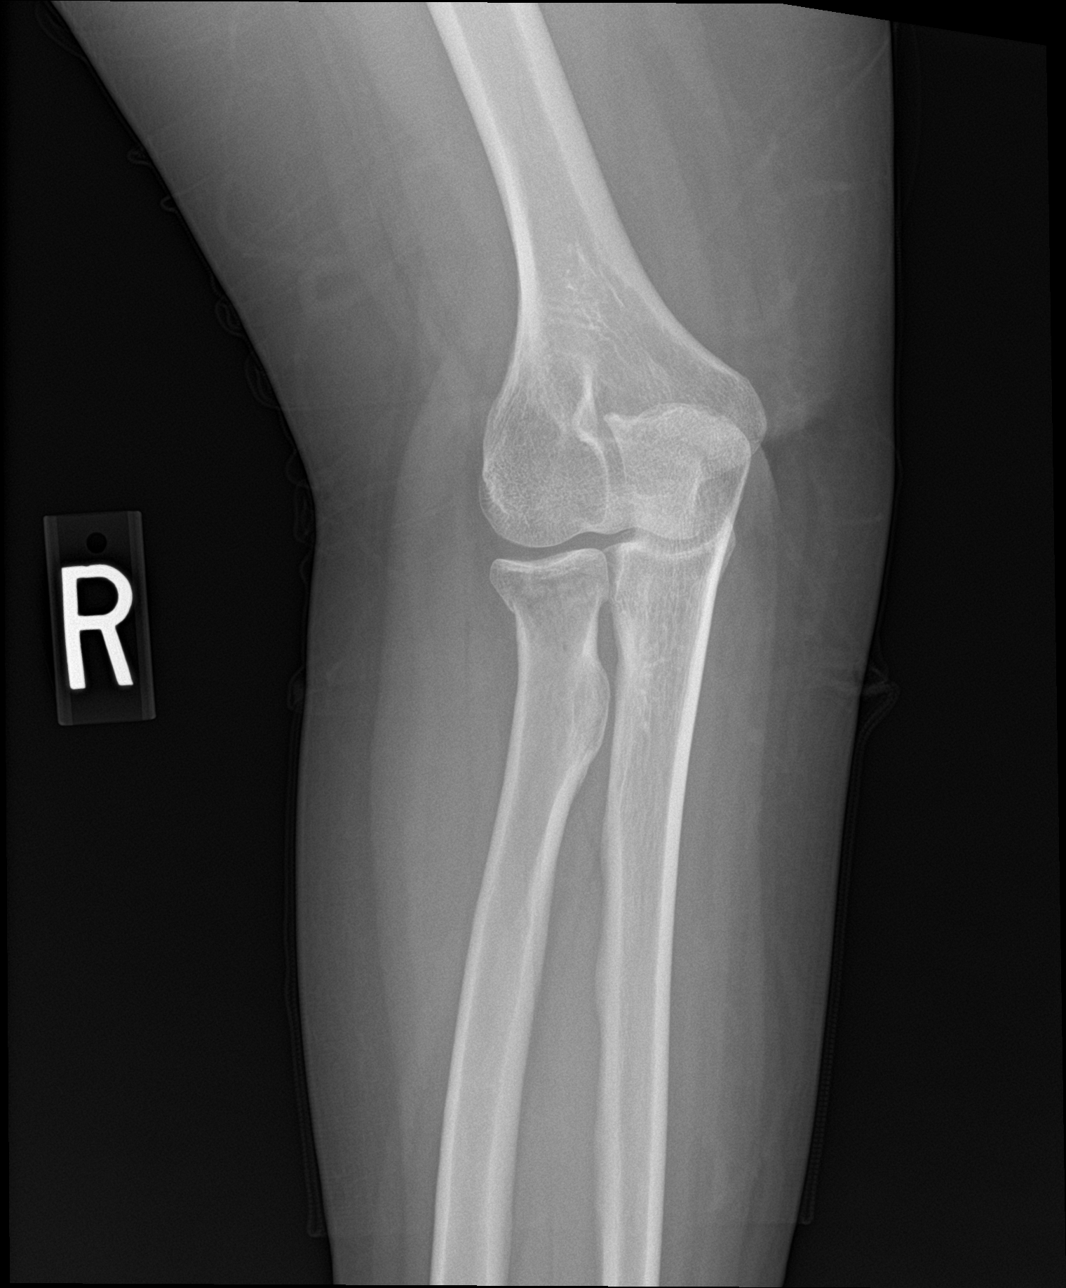

[elbow lat]
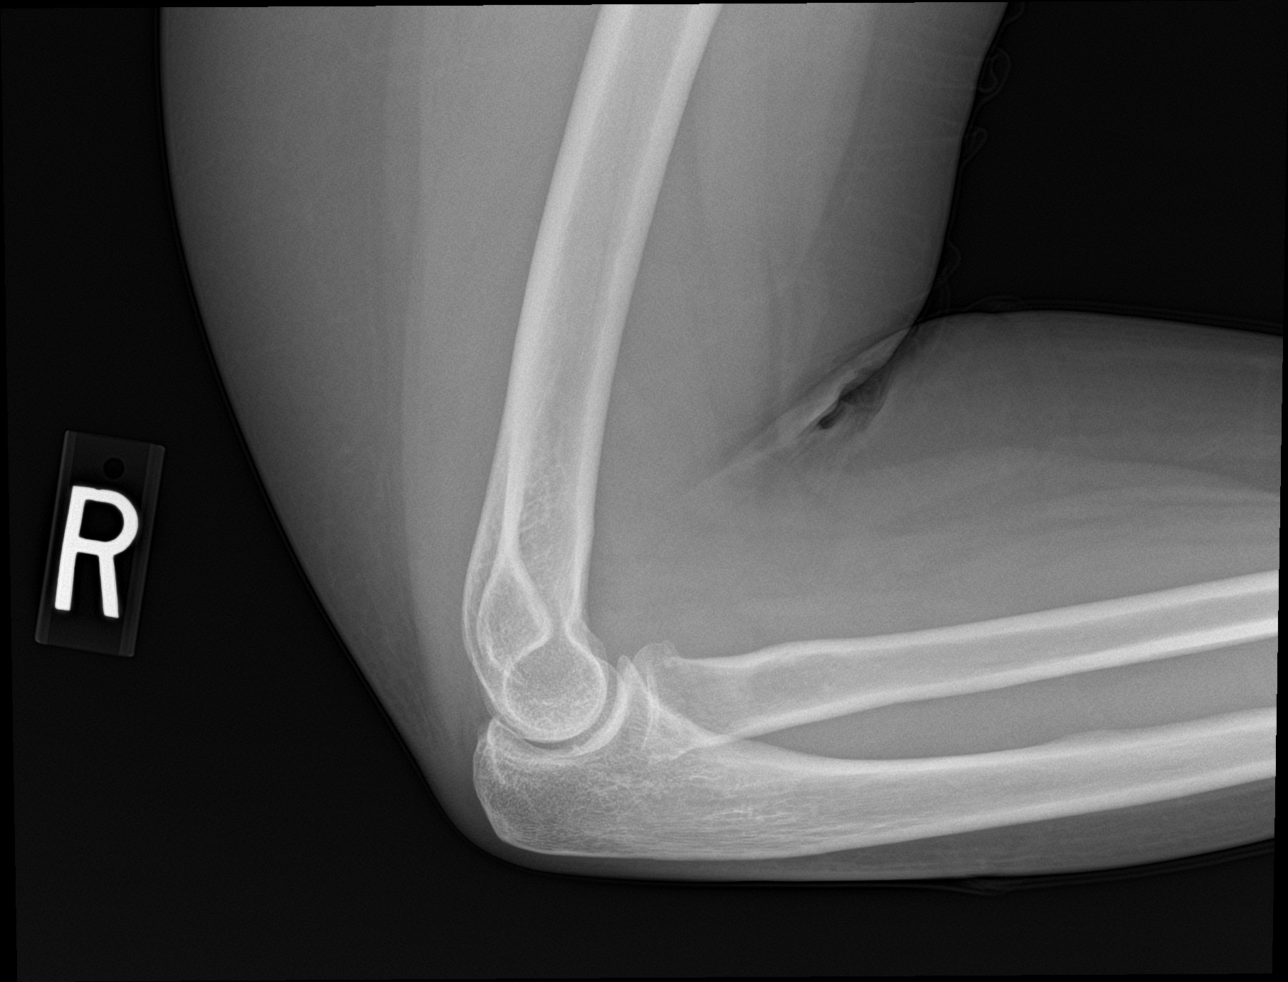

[elbow obl (1 of 2)]
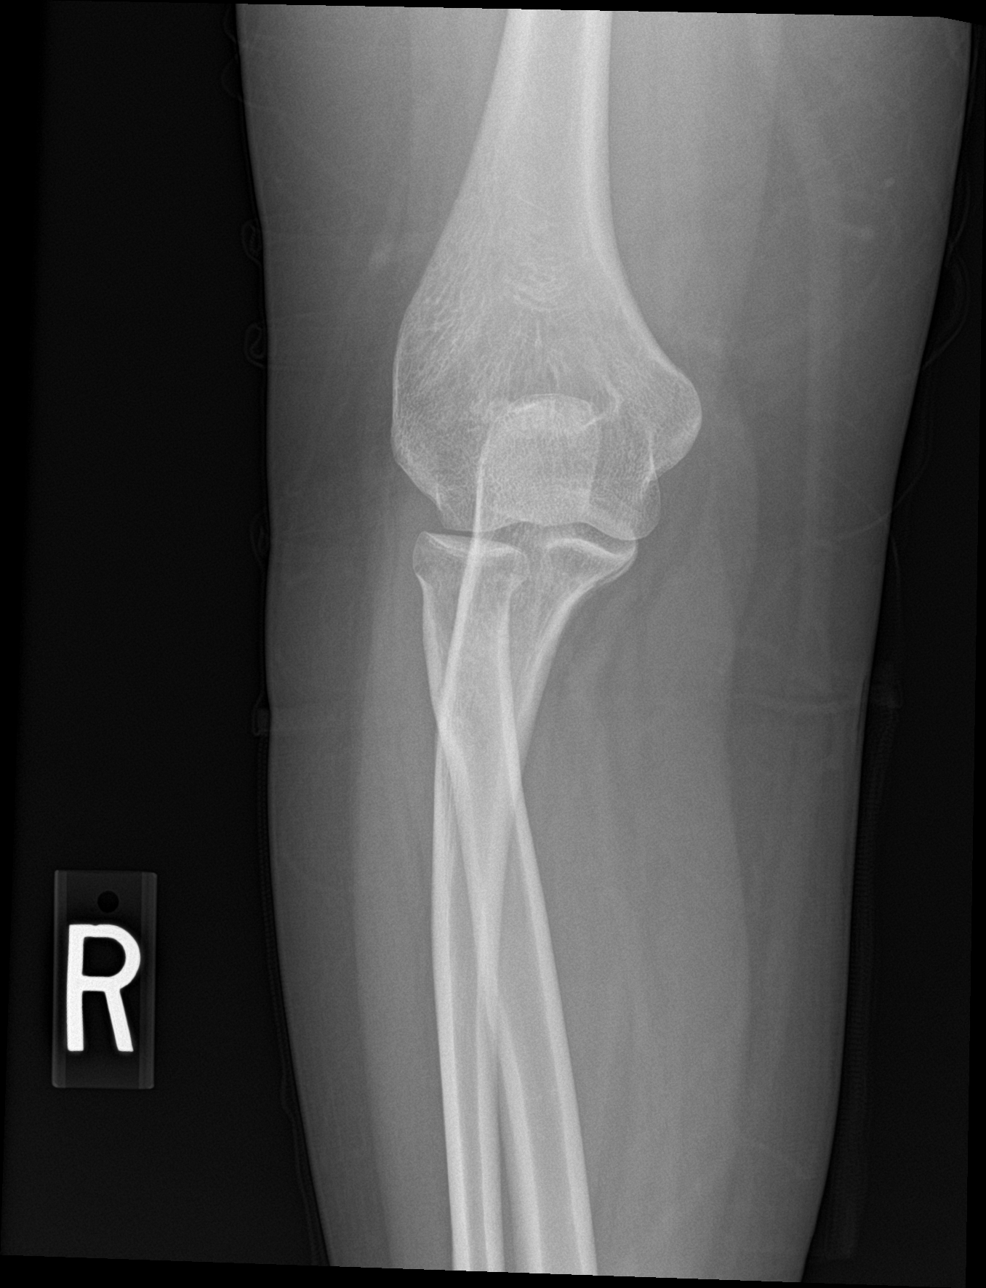

[elbow obl (2 of 2)]
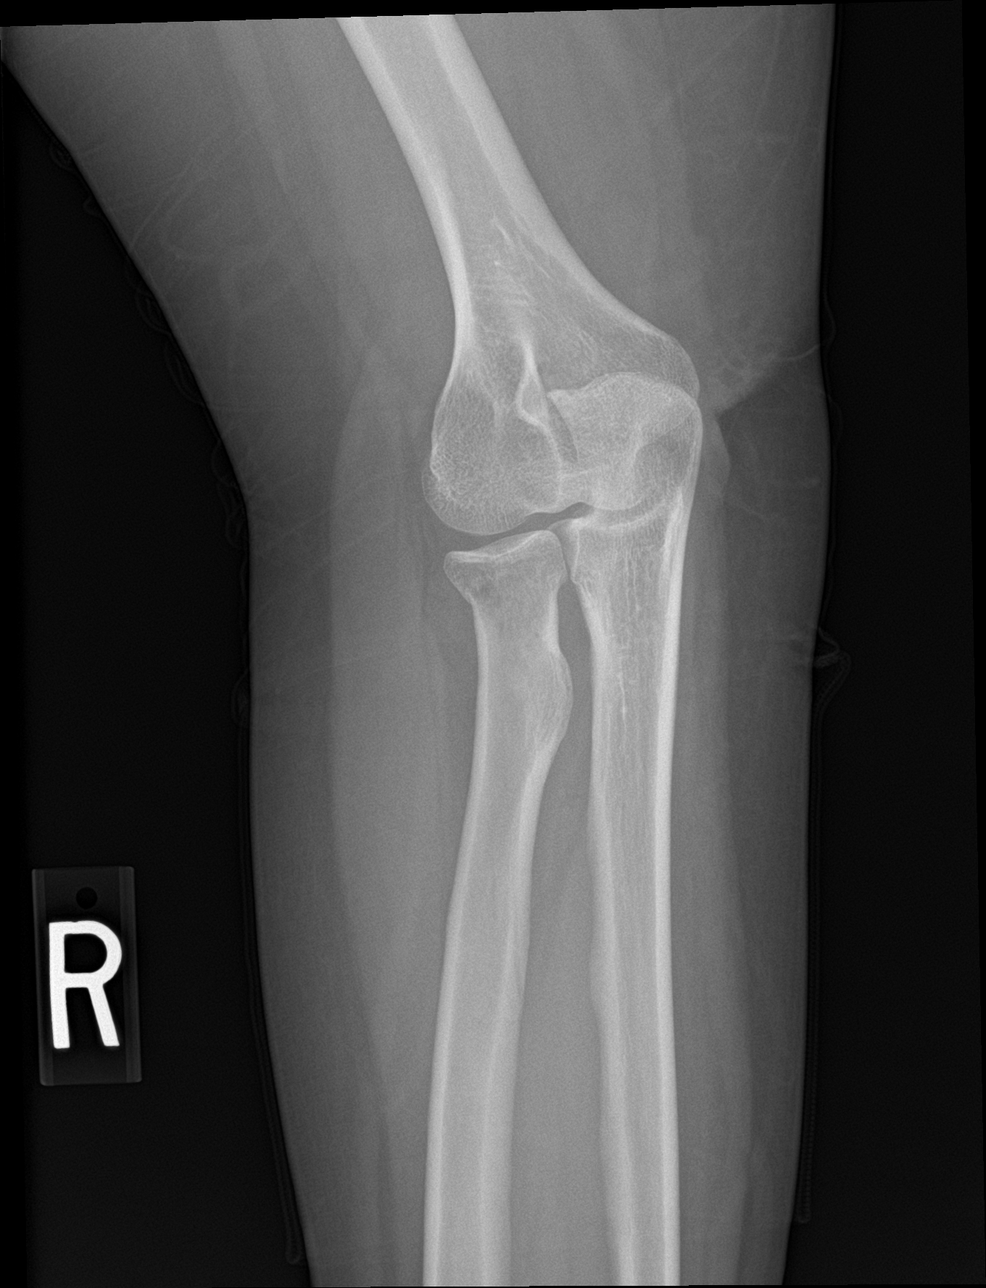

[4 of 4 positions shown; findings below may reference images not displayed]

FINDINGS: Evidence of patient's subacute transverse fracture of the radial
head/neck. Mild interval horizontal sclerosis adjacent the fracture
site. Remainder of the exam is unchanged.
IMPRESSION: Subacute transverse fracture of the radial head/neck.

## 2022-05-29 ENCOUNTER — Ambulatory Visit: Payer: Self-pay

## 2022-07-29 ENCOUNTER — Other Ambulatory Visit: Payer: Self-pay | Admitting: Physician Assistant

## 2022-07-29 DIAGNOSIS — G43009 Migraine without aura, not intractable, without status migrainosus: Secondary | ICD-10-CM

## 2022-10-27 ENCOUNTER — Other Ambulatory Visit: Payer: Self-pay | Admitting: Physician Assistant

## 2022-10-27 DIAGNOSIS — G43009 Migraine without aura, not intractable, without status migrainosus: Secondary | ICD-10-CM

## 2022-11-23 ENCOUNTER — Encounter: Payer: Self-pay | Admitting: Physician Assistant

## 2022-12-11 DIAGNOSIS — L03115 Cellulitis of right lower limb: Secondary | ICD-10-CM | POA: Diagnosis not present

## 2022-12-12 ENCOUNTER — Ambulatory Visit: Payer: Self-pay

## 2022-12-15 DIAGNOSIS — Z4801 Encounter for change or removal of surgical wound dressing: Secondary | ICD-10-CM | POA: Diagnosis not present

## 2022-12-15 DIAGNOSIS — S8991XA Unspecified injury of right lower leg, initial encounter: Secondary | ICD-10-CM | POA: Diagnosis not present

## 2022-12-15 DIAGNOSIS — Z79899 Other long term (current) drug therapy: Secondary | ICD-10-CM | POA: Diagnosis not present

## 2022-12-15 DIAGNOSIS — X58XXXA Exposure to other specified factors, initial encounter: Secondary | ICD-10-CM | POA: Diagnosis not present

## 2022-12-15 DIAGNOSIS — M79661 Pain in right lower leg: Secondary | ICD-10-CM | POA: Diagnosis not present

## 2022-12-23 ENCOUNTER — Telehealth (INDEPENDENT_AMBULATORY_CARE_PROVIDER_SITE_OTHER): Payer: BC Managed Care – PPO | Admitting: Physician Assistant

## 2022-12-23 ENCOUNTER — Encounter: Payer: Self-pay | Admitting: Physician Assistant

## 2022-12-23 VITALS — BP 138/88 | HR 98 | Wt 290.0 lb

## 2022-12-23 DIAGNOSIS — Z0000 Encounter for general adult medical examination without abnormal findings: Secondary | ICD-10-CM

## 2022-12-23 DIAGNOSIS — R7301 Impaired fasting glucose: Secondary | ICD-10-CM | POA: Diagnosis not present

## 2022-12-23 DIAGNOSIS — R030 Elevated blood-pressure reading, without diagnosis of hypertension: Secondary | ICD-10-CM | POA: Diagnosis not present

## 2022-12-23 DIAGNOSIS — F330 Major depressive disorder, recurrent, mild: Secondary | ICD-10-CM

## 2022-12-23 DIAGNOSIS — L03115 Cellulitis of right lower limb: Secondary | ICD-10-CM

## 2022-12-23 DIAGNOSIS — R002 Palpitations: Secondary | ICD-10-CM | POA: Diagnosis not present

## 2022-12-23 DIAGNOSIS — Z23 Encounter for immunization: Secondary | ICD-10-CM

## 2022-12-23 DIAGNOSIS — N94819 Vulvodynia, unspecified: Secondary | ICD-10-CM

## 2022-12-23 DIAGNOSIS — M79661 Pain in right lower leg: Secondary | ICD-10-CM

## 2022-12-23 DIAGNOSIS — F411 Generalized anxiety disorder: Secondary | ICD-10-CM

## 2022-12-23 DIAGNOSIS — G43009 Migraine without aura, not intractable, without status migrainosus: Secondary | ICD-10-CM

## 2022-12-23 MED ORDER — CLINDAMYCIN HCL 300 MG PO CAPS: 300.0000 mg | ORAL_CAPSULE | Freq: Three times a day (TID) | ORAL | 0 refills | Status: DC

## 2022-12-23 MED ORDER — DULOXETINE HCL 20 MG PO CPEP
40.0000 mg | ORAL_CAPSULE | Freq: Every day | ORAL | 3 refills | Status: DC
Start: 2022-12-23 — End: 2023-09-10

## 2022-12-23 MED ORDER — RIZATRIPTAN BENZOATE 10 MG PO TABS
10.0000 mg | ORAL_TABLET | ORAL | 5 refills | Status: DC | PRN
Start: 2022-12-23 — End: 2024-01-26
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
## 2022-12-24 ENCOUNTER — Ambulatory Visit (INDEPENDENT_AMBULATORY_CARE_PROVIDER_SITE_OTHER): Payer: BC Managed Care – PPO

## 2022-12-24 DIAGNOSIS — M79661 Pain in right lower leg: Secondary | ICD-10-CM | POA: Diagnosis not present

## 2022-12-24 DIAGNOSIS — S80811D Abrasion, right lower leg, subsequent encounter: Secondary | ICD-10-CM | POA: Diagnosis not present

## 2022-12-24 DIAGNOSIS — L03115 Cellulitis of right lower limb: Secondary | ICD-10-CM

## 2022-12-24 DIAGNOSIS — M79604 Pain in right leg: Secondary | ICD-10-CM | POA: Diagnosis not present

## 2022-12-24 DIAGNOSIS — R600 Localized edema: Secondary | ICD-10-CM | POA: Diagnosis not present

## 2022-12-24 LAB — LIPID PANEL WITH LDL/HDL RATIO
Cholesterol, Total: 218 mg/dL — ABNORMAL HIGH (ref 100–199)
HDL: 44 mg/dL (ref >39)
LDL Chol Calc (NIH): 155 mg/dL — ABNORMAL HIGH (ref 0–99)
LDL/HDL Ratio: 3.5 ratio — ABNORMAL HIGH (ref 0.0–3.2)
Triglycerides: 106 mg/dL (ref 0–149)
VLDL Cholesterol Cal: 19 mg/dL (ref 5–40)

## 2022-12-24 LAB — COMPREHENSIVE METABOLIC PANEL
ALT: 18 IU/L (ref 0–32)
AST: 25 IU/L (ref 0–40)
Albumin: 4.1 g/dL (ref 3.9–4.9)
Alkaline Phosphatase: 103 IU/L (ref 44–121)
BUN/Creatinine Ratio: 11 (ref 9–23)
BUN: 9 mg/dL (ref 6–20)
Bilirubin Total: 0.7 mg/dL (ref 0.0–1.2)
CO2: 22 mmol/L (ref 20–29)
Calcium: 8.9 mg/dL (ref 8.7–10.2)
Chloride: 103 mmol/L (ref 96–106)
Creatinine, Ser: 0.79 mg/dL (ref 0.57–1.00)
Globulin, Total: 2.3 g/dL (ref 1.5–4.5)
Glucose: 107 mg/dL — ABNORMAL HIGH (ref 70–99)
Potassium: 4.3 mmol/L (ref 3.5–5.2)
Sodium: 139 mmol/L (ref 134–144)
Total Protein: 6.4 g/dL (ref 6.0–8.5)
eGFR: 98 mL/min/{1.73_m2} (ref >59)

## 2022-12-24 LAB — CBC
Hematocrit: 44.2 % (ref 34.0–46.6)
Hemoglobin: 13.8 g/dL (ref 11.1–15.9)
MCH: 27.5 pg (ref 26.6–33.0)
MCHC: 31.2 g/dL — ABNORMAL LOW (ref 31.5–35.7)
MCV: 88 fL (ref 79–97)
Platelets: 297 10*3/uL (ref 150–450)
RBC: 5.01 x10E6/uL (ref 3.77–5.28)
RDW: 12.4 % (ref 11.7–15.4)
WBC: 8.2 10*3/uL (ref 3.4–10.8)

## 2022-12-24 LAB — TSH: TSH: 1.67 u[IU]/mL (ref 0.450–4.500)

## 2022-12-24 LAB — HEMOGLOBIN A1C
Est. average glucose Bld gHb Est-mCnc: 111 mg/dL
Hgb A1c MFr Bld: 5.5 % (ref 4.8–5.6)

## 2022-12-24 NOTE — Progress Notes (Signed)
Normal WBC which is reassuring that there is no systemic infection.  Thyroid looks good.  Normal A1C, trending towards pre-diabetes range but still normal Kidney and liver look good LDL not to goal, weight loss and regular exercise could help this number come down.

## 2022-12-25 NOTE — Progress Notes (Signed)
GREAT news! No DVT. You have a minimal amt of fluid collection in right medial lower leg. Use compression socks for the new week and lets see if we can push the fluid out.

## 2022-12-28 DIAGNOSIS — L03115 Cellulitis of right lower limb: Secondary | ICD-10-CM | POA: Insufficient documentation

## 2022-12-28 DIAGNOSIS — R002 Palpitations: Secondary | ICD-10-CM | POA: Insufficient documentation

## 2023-01-01 ENCOUNTER — Encounter: Payer: Self-pay | Admitting: Physician Assistant

## 2023-01-18 DIAGNOSIS — M79672 Pain in left foot: Secondary | ICD-10-CM | POA: Diagnosis not present

## 2023-01-18 DIAGNOSIS — B351 Tinea unguium: Secondary | ICD-10-CM | POA: Diagnosis not present

## 2023-01-18 DIAGNOSIS — D219 Benign neoplasm of connective and other soft tissue, unspecified: Secondary | ICD-10-CM | POA: Diagnosis not present

## 2023-01-18 DIAGNOSIS — I999 Unspecified disorder of circulatory system: Secondary | ICD-10-CM | POA: Diagnosis not present

## 2023-02-02 ENCOUNTER — Telehealth: Payer: Self-pay | Admitting: *Deleted

## 2023-02-02 NOTE — Telephone Encounter (Signed)
Left patient a message to call and schedule annual that is due after 02/24/2023.

## 2023-02-18 ENCOUNTER — Ambulatory Visit: Payer: BC Managed Care – PPO | Admitting: Bariatrics

## 2023-03-02 DIAGNOSIS — Z0289 Encounter for other administrative examinations: Secondary | ICD-10-CM

## 2023-03-03 ENCOUNTER — Encounter: Payer: Self-pay | Admitting: Bariatrics

## 2023-03-03 ENCOUNTER — Ambulatory Visit (INDEPENDENT_AMBULATORY_CARE_PROVIDER_SITE_OTHER): Payer: BC Managed Care – PPO | Admitting: Bariatrics

## 2023-03-03 VITALS — BP 140/98 | HR 83 | Temp 98.1°F | Ht 66.5 in | Wt 291.0 lb

## 2023-03-03 DIAGNOSIS — Z6841 Body Mass Index (BMI) 40.0 and over, adult: Secondary | ICD-10-CM

## 2023-03-03 DIAGNOSIS — E7800 Pure hypercholesterolemia, unspecified: Secondary | ICD-10-CM

## 2023-03-03 DIAGNOSIS — E65 Localized adiposity: Secondary | ICD-10-CM

## 2023-03-03 NOTE — Progress Notes (Signed)
Office: [phone]  /  Fax: [phone]   Initial Visit  Janet Clayton was seen in clinic today to evaluate for obesity. She is interested in losing weight to improve overall health and reduce the risk of weight related complications. She presents today to review program treatment options, initial physical assessment, and evaluation.     She was referred by: Friend or Family  When asked what else they would like to accomplish? She states: Adopt healthier eating patterns, Improve energy levels and physical activity, Improve quality of life, and Improve self-confidence  When asked how has your weight affected you? She states: Has affected self-esteem, Contributed to medical problems, Having fatigue, and Having poor endurance  Some associated conditions: Hyperlipidemia  Contributing factors: Family history of obesity and Reduced physical activity  Weight promoting medications identified: Contraceptives or hormonal therapy  Current nutrition plan: Journaling  Current level of physical activity: Walking  Current or previous pharmacotherapy: None  Response to medication: Never tried medications Will talk about in the past.    Past medical history includes:   Past Medical History:  Diagnosis Date   Anxiety    Depression    Vaginal Pap smear, abnormal      Objective:   BP (!) 140/98   Pulse 83   Temp 98.1 F (36.7 C)   Ht 5' 6.5" (1.689 m)   Wt 291 lb (132 kg)   SpO2 97%   BMI 46.26 kg/m  She was weighed on the bioimpedance scale: Body mass index is 46.26 kg/m.  Peak Weight:291 lbs , Body Fat%:53.3 %, Visceral Fat Rating:17, Weight trend over the last 12 months: some fluctuating.   General:  Alert, oriented and cooperative. Patient is in no acute distress.  Respiratory: Normal respiratory effort, no problems with respiration noted  Extremities: Normal range of motion.    Mental Status: Normal mood and affect. Normal behavior. Normal judgment and thought content.    DIAGNOSTIC DATA REVIEWED:  BMET    Component Value Date/Time   NA 139 12/23/2022 1155   K 4.3 12/23/2022 1155   CL 103 12/23/2022 1155   CO2 22 12/23/2022 1155   GLUCOSE 107 (H) 12/23/2022 1155   GLUCOSE 114 (H) 02/26/2022 0733   BUN 9 12/23/2022 1155   CREATININE 0.79 12/23/2022 1155   CREATININE 0.67 02/26/2022 0733   CALCIUM 8.9 12/23/2022 1155   GFRNONAA 115 11/16/2018 0833   GFRAA 133 11/16/2018 0833   Lab Results  Component Value Date   HGBA1C 5.5 12/23/2022   HGBA1C 5.2 02/17/2021   No results found for: "INSULIN" CBC    Component Value Date/Time   WBC 8.2 12/23/2022 1155   WBC 6.6 02/26/2022 0733   RBC 5.01 12/23/2022 1155   RBC 4.93 02/26/2022 0733   HGB 13.8 12/23/2022 1155   HCT 44.2 12/23/2022 1155   PLT 297 12/23/2022 1155   MCV 88 12/23/2022 1155   MCH 27.5 12/23/2022 1155   MCH 29.0 02/26/2022 0733   MCHC 31.2 (L) 12/23/2022 1155   MCHC 33.3 02/26/2022 0733   RDW 12.4 12/23/2022 1155   Iron/TIBC/Ferritin/ %Sat No results found for: "IRON", "TIBC", "FERRITIN", "IRONPCTSAT" Lipid Panel     Component Value Date/Time   CHOL 218 (H) 12/23/2022 1155   TRIG 106 12/23/2022 1155   HDL 44 12/23/2022 1155   CHOLHDL 4.5 02/26/2022 0733   VLDL 26 06/14/2015 0952   LDLCALC 155 (H) 12/23/2022 1155   LDLCALC 149 (H) 02/26/2022 0733   Hepatic Function Panel  Component Value Date/Time   PROT 6.4 12/23/2022 1155   ALBUMIN 4.1 12/23/2022 1155   AST 25 12/23/2022 1155   ALT 18 12/23/2022 1155   ALKPHOS 103 12/23/2022 1155   BILITOT 0.7 12/23/2022 1155      Component Value Date/Time   TSH 1.670 12/23/2022 1155     Assessment and Plan:   Elevated cholesterol:  LDL is at goal. Medication(s): none Cardiovascular risk factors: dyslipidemia, obesity (BMI >= 30 kg/m2), and sedentary lifestyle  Lab Results  Component Value Date   CHOL 218 (H) 12/23/2022   HDL 44 12/23/2022   LDLCALC 155 (H) 12/23/2022   TRIG 106 12/23/2022   CHOLHDL 4.5  02/26/2022   Lab Results  Component Value Date   ALT 18 12/23/2022   AST 25 12/23/2022   ALKPHOS 103 12/23/2022   BILITOT 0.7 12/23/2022   The ASCVD Risk score (Arnett DK, et al., 2019) failed to calculate for the following reasons:   The 2019 ASCVD risk score is only valid for ages 37 to 22  Plan:  Information sheet on healthy vs unhealthy fats.  Will avoid all trans fats.  Will read labels Will minimize saturated fats except the following: low fat meats in moderation, diary, and limited dark chocolate.     Visceral Obesity.   She has a visceral fat rating of 17 per the bio-impedence scale.   Plan: The goal is a visceral fat rating of 13 or below.  Will work on the plan and increase exercise/begin exercise.  Will minimize all carbohydrates ( sweets and starches ).     Morbid Obesity: Current BMI 46    Obesity Treatment / Action Plan:  Patient will work on garnering support from family and friends to begin weight loss journey. Will work on eliminating or reducing the presence of highly palatable, calorie dense foods in the home. Will complete provided nutritional and psychosocial assessment questionnaire before the next appointment. Will be scheduled for indirect calorimetry to determine resting energy expenditure in a fasting state.  This will allow [place] to create a reduced calorie, high-protein meal plan to promote loss of fat mass while preserving muscle mass. Counseled on the health benefits of losing 5%-15% of total body weight. Was counseled on nutritional approaches to weight loss and benefits of reducing processed foods and consuming plant-based foods and high quality protein as part of nutritional weight management. Was counseled on pharmacotherapy and role as an adjunct in weight management.   Obesity Education Performed Today:  She was weighed on the bioimpedance scale and results were discussed and documented in the synopsis.  We discussed obesity as a disease  and the importance of a more detailed evaluation of all the factors contributing to the disease.  We discussed the importance of long term lifestyle changes which include nutrition, exercise and behavioral modifications as well as the importance of customizing this to her specific health and social needs.  We discussed the benefits of reaching a healthier weight to alleviate the symptoms of existing conditions and reduce the risks of the biomechanical, metabolic and psychological effects of obesity.  Discussed New Patient/Late Arrival, and Cancellation Policies. Patient voiced understanding and allowed to ask questions.   Laraine Margiotta appears to be in the action stage of change and states they are ready to start intensive lifestyle modifications and behavioral modifications.  30 minutes was spent today on this visit including the above counseling, pre-visit chart review, and post-visit documentation.  Reviewed by clinician on day of visit:  allergies, medications, problem list, medical history, surgical history, family history, social history, and previous encounter notes.    Angelli Baruch A. Lorretta HarpO.

## 2023-03-08 ENCOUNTER — Encounter (INDEPENDENT_AMBULATORY_CARE_PROVIDER_SITE_OTHER): Payer: Self-pay

## 2023-03-09 ENCOUNTER — Ambulatory Visit (INDEPENDENT_AMBULATORY_CARE_PROVIDER_SITE_OTHER): Payer: BC Managed Care – PPO | Admitting: Bariatrics

## 2023-03-09 ENCOUNTER — Encounter: Payer: Self-pay | Admitting: Bariatrics

## 2023-03-09 VITALS — BP 138/88 | HR 95 | Temp 98.1°F | Ht 66.5 in | Wt 292.0 lb

## 2023-03-09 DIAGNOSIS — E66813 Obesity, class 3: Secondary | ICD-10-CM

## 2023-03-09 DIAGNOSIS — Z1331 Encounter for screening for depression: Secondary | ICD-10-CM

## 2023-03-09 DIAGNOSIS — E538 Deficiency of other specified B group vitamins: Secondary | ICD-10-CM | POA: Diagnosis not present

## 2023-03-09 DIAGNOSIS — Z0000 Encounter for general adult medical examination without abnormal findings: Secondary | ICD-10-CM

## 2023-03-09 DIAGNOSIS — R0602 Shortness of breath: Secondary | ICD-10-CM | POA: Diagnosis not present

## 2023-03-09 DIAGNOSIS — R5383 Other fatigue: Secondary | ICD-10-CM | POA: Diagnosis not present

## 2023-03-09 DIAGNOSIS — E559 Vitamin D deficiency, unspecified: Secondary | ICD-10-CM | POA: Diagnosis not present

## 2023-03-09 DIAGNOSIS — E785 Hyperlipidemia, unspecified: Secondary | ICD-10-CM

## 2023-03-09 DIAGNOSIS — Z6841 Body Mass Index (BMI) 40.0 and over, adult: Secondary | ICD-10-CM

## 2023-03-09 DIAGNOSIS — E782 Mixed hyperlipidemia: Secondary | ICD-10-CM

## 2023-03-09 NOTE — Progress Notes (Signed)
At a Glance:  Vitals Temp: 98.1 F (36.7 C) BP: 138/88 Pulse Rate: 95 SpO2: 92 %   Anthropometric Measurements Height: 5' 6.5" (1.689 m) Weight: 292 lb (132.5 kg) BMI (Calculated): 46.43 Starting Weight: 292lb Waist Measurement : 57 inches   Body Composition  Body Fat %: 54.1 % Fat Mass (lbs): 158.2 lbs Muscle Mass (lbs): 127.4 lbs Visceral Fat Rating : 17   Other Clinical Data RMR: 2160 Fasting: yes Labs: yes Today's Visit #: 1 Starting Date: 03/09/23    EKG: Normal sinus rhythm, rate 97. No abnormal EKG's  Indirect Calorimeter:   Resting Metabolic Rate ( RMR):  RMR (actual): 2160 kcal RMR (calculated): 1980 kcal The calculated basal metabolic rate is 1,610 kcal thus her basal metabolic rate is better than expected.  Plan:   Indirect calorimeter completed, interpreted and reviewed with patient today and allowed to ask questions.  Discussed the implications for the chosen plan and exercise based on the RMR reading.  Will consider repeating the RMR in the future based on weight loss.    Chief Complaint:  Obesity   Subjective:  Janet Clayton (MR# [id number]) is a 37 y.o. female who presents for evaluation and treatment of obesity and related comorbidities.   Janet Clayton is currently in the action stage of change and ready to dedicate time achieving and maintaining a healthier weight. Janet Clayton is interested in becoming our patient and working on intensive lifestyle modifications including (but not limited to) diet and exercise for weight loss.  Janet Clayton has been struggling with her weight. She has been unsuccessful in either losing weight, maintaining weight loss, or reaching her healthy weight goal.  Janet Clayton habits were reviewed today and are as follows: Her family eats meals together, she thinks her family will eat healthier with her, she has been heavy most of her life, and she skips meals frequently.   She started gaining weight in childhood.    Current or previous pharmacotherapy: None  Response to medication: Never tried medications  Other Fatigue Janet Clayton admits to daytime somnolence and admits to waking up still tired. Patient has a history of symptoms of none. Janet Clayton generally gets 5 or 6 hours of sleep per night, and states that she has difficulty falling asleep. Snoring is present. Apneic episodes is not present. Epworth Sleepiness Score is 1.   Shortness of Breath Janet Clayton notes increasing shortness of breath with exercising and seems to be worsening over time with weight gain. She notes getting out of breath sooner with activity than she used to. This has not gotten worse recently. Janet Clayton denies shortness of breath at rest or orthopnea.  Depression Screen Janet Clayton's Food and Mood (modified PHQ-9) score was 8. 5-9 mild depression     02/13/2022    2:26 PM  Depression screen PHQ 2/9  Decreased Interest 1  Down, Depressed, Hopeless 1  PHQ - 2 Score 2  Altered sleeping 2  Tired, decreased energy 1  Change in appetite 1  Feeling bad or failure about yourself  1  Trouble concentrating 1  Moving slowly or fidgety/restless 1  Suicidal thoughts 0  PHQ-9 Score 9  Difficult doing work/chores Not difficult at all     Assessment and Plan:   Other Fatigue Janet Clayton does feel that her weight is causing her energy to be lower than it should be. Fatigue may be related to obesity, depression or many other causes. Labs will be ordered, and in the meanwhile, Janet Clayton will focus on self care including making healthy food  choices, increasing physical activity and focusing on stress reduction.  Shortness of Breath Janet Clayton does feel that she gets out of breath more easily that she used to when she exercises. Janet Clayton's shortness of breath appears to be obesity related and exercise induced. She has agreed to work on weight loss and gradually increase exercise to treat her exercise induced shortness of breath. Will continue to  monitor closely.  Health Maintenance:   Obesity   Plan: Will do EKG, indirect calorimetry, and labs.     Vitamin D Deficiency She is at risk for vitamin D deficiency due to obesity.   Plan: Will check for vitamin D deficiency.    B 12 deficiency:   She is at risk for B 12 deficiency from previous history.   Plan:  Check B 12 lab today.   Hyperlipidemia LDL is not at goal. Medication(s): none Cardiovascular risk factors: dyslipidemia, obesity (BMI >= 30 kg/m2), and sedentary lifestyle  Lab Results  Component Value Date   CHOL 218 (H) 12/23/2022   HDL 44 12/23/2022   LDLCALC 155 (H) 12/23/2022   TRIG 106 12/23/2022   CHOLHDL 4.5 02/26/2022   Lab Results  Component Value Date   ALT 18 12/23/2022   AST 25 12/23/2022   ALKPHOS 103 12/23/2022   BILITOT 0.7 12/23/2022   The ASCVD Risk score (Arnett DK, et al., 2019) failed to calculate for the following reasons:   The 2019 ASCVD risk score is only valid for ages 34 to 41  Plan:  Will avoid all trans fats.  Will read labels Will minimize saturated fats except the following: low fat meats in moderation, diary, and limited dark chocolate.        Previous labs reviewed today. Date: 12/23/2022 CMP, Lipids, HgbA1c, and TSH and CBC  Labs done today Insulin, Vit D, and Vit B12   Morbid Obesity: BMI (Calculated): 46.43   Janet Clayton is currently in the action stage of change and her goal is to begin weight loss efforts. I recommend Janet Clayton begin the structured treatment plan as follows:  She has agreed to Category 3 Plan  Exercise goals: All adults should avoid inactivity. Some activity is better than none, and adults who participate in any amount of physical activity, gain some health benefits.  Behavioral modification strategies:increasing lean protein intake, increasing vegetables, increase H2O intake, increase high fiber foods, decreasing eating out, no skipping meals, better snacking choices, and planning for  success  She was informed of the importance of frequent follow-up visits to maximize her success with intensive lifestyle modifications for her multiple health conditions. She was informed we would discuss her lab results at her next visit unless there is a critical issue that needs to be addressed sooner. Janet Clayton agreed to keep her next visit at the agreed upon time to discuss these results.  Objective:  General: Cooperative, alert, well developed, in no acute distress. HEENT: Conjunctivae and lids unremarkable. Cardiovascular: Regular rhythm.  Lungs: Normal work of breathing. Neurologic: No focal deficits.   Lab Results  Component Value Date   CREATININE 0.79 12/23/2022   BUN 9 12/23/2022   NA 139 12/23/2022   K 4.3 12/23/2022   CL 103 12/23/2022   CO2 22 12/23/2022   Lab Results  Component Value Date   ALT 18 12/23/2022   AST 25 12/23/2022   ALKPHOS 103 12/23/2022   BILITOT 0.7 12/23/2022   Lab Results  Component Value Date   HGBA1C 5.5 12/23/2022   HGBA1C 5.6 02/26/2022  HGBA1C 5.2 02/17/2021   No results found for: "INSULIN" Lab Results  Component Value Date   TSH 1.670 12/23/2022   Lab Results  Component Value Date   CHOL 218 (H) 12/23/2022   HDL 44 12/23/2022   LDLCALC 155 (H) 12/23/2022   TRIG 106 12/23/2022   CHOLHDL 4.5 02/26/2022   Lab Results  Component Value Date   WBC 8.2 12/23/2022   HGB 13.8 12/23/2022   HCT 44.2 12/23/2022   MCV 88 12/23/2022   PLT 297 12/23/2022   No results found for: "IRON", "TIBC", "FERRITIN"  Attestation Statements:  Applicable history such as the following:  allergies, medications, problem list, medical history, surgical history, family history, social history, and previous encounter notes reviewed by clinician on day of visit:  This may have been prepared with the assistance of Engineer, civil (consulting).  Occasional wrong-word or sound-a-like substitutions may have occurred due to the inherent limitations of voice  recognition software.    * Time spent on visit including the items listed below was 45 minutes.  -preparing to see the patient (e.g., review of tests, history, previous notes) -obtaining and/or reviewing separately obtained history -counseling and educating the patient/family/caregiver -documenting clinical information in the electronic or other health record -ordering medications, tests, or procedures -independently interpreting results and communicating results to the patient/ family/caregiver -referring and communicating with other health care professionals  -care coordination     Corinna Capra, DO

## 2023-03-11 ENCOUNTER — Encounter: Payer: Self-pay | Admitting: Bariatrics

## 2023-03-11 DIAGNOSIS — E559 Vitamin D deficiency, unspecified: Secondary | ICD-10-CM | POA: Insufficient documentation

## 2023-03-11 DIAGNOSIS — E88819 Insulin resistance, unspecified: Secondary | ICD-10-CM | POA: Insufficient documentation

## 2023-03-11 DIAGNOSIS — E538 Deficiency of other specified B group vitamins: Secondary | ICD-10-CM | POA: Insufficient documentation

## 2023-03-11 LAB — INSULIN, RANDOM: INSULIN: 27.1 u[IU]/mL — ABNORMAL HIGH (ref 2.6–24.9)

## 2023-03-11 LAB — VITAMIN D 25 HYDROXY (VIT D DEFICIENCY, FRACTURES): Vit D, 25-Hydroxy: 12.7 ng/mL — ABNORMAL LOW (ref 30.0–100.0)

## 2023-03-11 LAB — VITAMIN B12: Vitamin B-12: 201 pg/mL — ABNORMAL LOW (ref 232–1245)

## 2023-03-11 NOTE — Progress Notes (Signed)
Spoke w/ pt, she verbalized understanding-CS

## 2023-03-22 ENCOUNTER — Ambulatory Visit: Payer: BC Managed Care – PPO | Admitting: Obstetrics & Gynecology

## 2023-03-23 ENCOUNTER — Ambulatory Visit: Payer: BC Managed Care – PPO | Admitting: Bariatrics

## 2023-03-25 ENCOUNTER — Encounter: Payer: Self-pay | Admitting: Bariatrics

## 2023-03-25 ENCOUNTER — Ambulatory Visit (INDEPENDENT_AMBULATORY_CARE_PROVIDER_SITE_OTHER): Payer: BC Managed Care – PPO | Admitting: Bariatrics

## 2023-03-25 VITALS — BP 136/83 | HR 91 | Temp 97.8°F | Ht 66.5 in | Wt 289.0 lb

## 2023-03-25 DIAGNOSIS — E538 Deficiency of other specified B group vitamins: Secondary | ICD-10-CM | POA: Diagnosis not present

## 2023-03-25 DIAGNOSIS — E559 Vitamin D deficiency, unspecified: Secondary | ICD-10-CM | POA: Diagnosis not present

## 2023-03-25 DIAGNOSIS — Z6841 Body Mass Index (BMI) 40.0 and over, adult: Secondary | ICD-10-CM

## 2023-03-25 DIAGNOSIS — E88819 Insulin resistance, unspecified: Secondary | ICD-10-CM | POA: Diagnosis not present

## 2023-03-25 MED ORDER — VITAMIN D (ERGOCALCIFEROL) 1.25 MG (50000 UNIT) PO CAPS: 50000.0000 [IU] | ORAL_CAPSULE | ORAL | 0 refills | Status: DC

## 2023-03-25 MED ORDER — METFORMIN HCL 500 MG PO TABS: 500.0000 mg | ORAL_TABLET | Freq: Every day | ORAL | 0 refills | Status: DC

## 2023-03-25 NOTE — Progress Notes (Signed)
WEIGHT SUMMARY AND BIOMETRICS  First Follow-up note:   Weight Lost Since Last Visit: 3lb  Weight Gained Since Last Visit: 0   Vitals Temp: 97.8 F (36.6 C) BP: 136/83 Pulse Rate: 91 SpO2: (!) 89 %   Anthropometric Measurements Height: 5' 6.5" (1.689 m) Weight: 289 lb (131.1 kg) BMI (Calculated): 45.95 Weight at Last Visit: 292lb Weight Lost Since Last Visit: 3lb Weight Gained Since Last Visit: 0 Starting Weight: 292lb Total Weight Loss (lbs): 3 lb (1.361 kg)   Body Composition  Body Fat %: 53.7 % Fat Mass (lbs): 155.2 lbs Muscle Mass (lbs): 127 lbs Total Body Water (lbs): 104.6 lbs Visceral Fat Rating : 17   Other Clinical Data Fasting: yes Labs: no Today's Visit #: 2 Starting Date: 03/09/23    OBESITY Emah is here to discuss her progress with her obesity treatment plan along with follow-up of her obesity related diagnoses.    Nutrition Plan: the Category 3 plan - 98% adherence.  Current exercise: walking  Interim History:  She is down 3 lbs since her last visit.  Eating all of the food on the plan., Protein intake is as prescribed, Is skipping meals, Meeting protein goals., and Water intake is inadequate.   Pharmacotherapy: Hunger is moderately controlled.  Cravings are moderately controlled.  Assessment/Plan:   Vitamin D Deficiency Vitamin D is not at goal of 50.  Most recent vitamin D level was 12.7. She is on no vitamins.  Lab Results  Component Value Date   VD25OH 12.7 (L) 03/09/2023    Plan: Begin prescription vitamin D 50,000 IU weekly.    B 12 deficiency:   She has a low B 12 level. She is not taking any B 12 or vitamins.   Plan:  Begin B 12 at 1,000 mcg OTC.  Will recheck at her next lab check.   Insulin Resistance Reyhana has had elevated fasting insulin readings. Goal is HgbA1c < 5.7, fasting insulin at l0  or less, and preferably at 5.  She reports  polyphagia. Medication(s) none at this time  Lab Results  Component Value Date   HGBA1C 5.5 12/23/2022   Lab Results  Component Value Date   INSULIN 27.1 (H) 03/09/2023    Plan Medication(s): Metformin 500 mg once daily breakfast. Discussed Metformin and information sheet given.  Will work on the agreed upon plan. Will minimize refined carbohydrates ( sweets and starches), and focus more on complex carbohydrates.  Increase the micronutrients found in leafy greens, which include magnesium, polyphenols, and vitamin C which have been postulated to help with insulin sensitivity. Minimize "fast food" and cook more meals at home.  Increase fiber to 25 to 30 grams daily.  Information sheet on " Insulin Resistance and Prediabetes".   Increase protein and water ( will get a "water app".  She will be more active ( considering a walking  pad).    Morbid Obesity: Current BMI BMI (Calculated): 45.95   Pharmacotherapy Plan Start  Metformin 500 mg once daily breakfast  Kittie is currently in the action stage of change. As such, her goal is to continue with weight loss efforts.  She has agreed to the Category 3 plan.  Exercise goals: For substantial health benefits, adults should do at least 150 minutes (2 hours and 30 minutes) a week of moderate-intensity, or 75 minutes (1 hour and 15 minutes) a week of vigorous-intensity aerobic physical activity, or an equivalent combination of moderate- and vigorous-intensity aerobic activity. Aerobic activity should be performed in episodes of at least 10 minutes, and preferably, it should be spread throughout the week.  Behavioral modification strategies: increasing lean protein intake, decreasing simple carbohydrates , no meal skipping, decrease eating out, meal planning , increase water intake, better snacking choices, planning for success, increasing vegetables, increasing fiber rich foods, avoiding temptations,  increase frequency of journaling, and mindful eating.  Janais has agreed to follow-up with our clinic in 2 weeks.     Objective:   VITALS: Per patient if applicable, see vitals. GENERAL: Alert and in no acute distress. CARDIOPULMONARY: No increased WOB. Speaking in clear sentences.  PSYCH: Pleasant and cooperative. Speech normal rate and rhythm. Affect is appropriate. Insight and judgement are appropriate. Attention is focused, linear, and appropriate.  NEURO: Oriented as arrived to appointment on time with no prompting.   Attestation Statements:   This was prepared with the assistance of Engineer, civil (consulting).  Occasional wrong-word or sound-a-like substitutions may have occurred due to the inherent limitations of voice recognition   Corinna Capra, DO

## 2023-03-29 ENCOUNTER — Encounter: Payer: Self-pay | Admitting: Obstetrics & Gynecology

## 2023-03-29 ENCOUNTER — Ambulatory Visit: Payer: BC Managed Care – PPO | Admitting: Obstetrics & Gynecology

## 2023-03-29 VITALS — BP 133/85 | HR 87 | Ht 66.5 in | Wt 294.0 lb

## 2023-03-29 DIAGNOSIS — Z01419 Encounter for gynecological examination (general) (routine) without abnormal findings: Secondary | ICD-10-CM

## 2023-03-29 DIAGNOSIS — L680 Hirsutism: Secondary | ICD-10-CM | POA: Diagnosis not present

## 2023-03-29 DIAGNOSIS — E282 Polycystic ovarian syndrome: Secondary | ICD-10-CM

## 2023-03-29 NOTE — Progress Notes (Unsigned)
  Subjective:     Janet Clayton is a 37 y.o. female here for a routine exam.  Current complaints: none.  Happy with choice for  sterilization.     Gynecologic History Patient's last menstrual period was 03/19/2023. Contraception: tubal ligation Last Mammogram: n/a Last Pap Smear:  02/23/22- negative Last Colon Screening;  n/a Seat Belts:   yes Sun Screen:   yes Dental Check Up:  yes Brush & Floss:  yes   Obstetric History OB History  Gravida Para Term Preterm AB Living  0 0 0 0 0 0  SAB IAB Ectopic Multiple Live Births  0 0 0 0 0     The following portions of the patient's history were reviewed and updated as appropriate: allergies, current medications, past family history, past medical history, past social history, past surgical history, and problem list.  Review of Systems Pertinent items noted in HPI and remainder of comprehensive ROS otherwise negative.    Objective:     Vitals:   03/29/23 1334  BP: 133/85  Pulse: 87  Weight: 294 lb (133.4 kg)  Height: 5' 6.5" (1.689 m)   Vitals:  WNL General appearance: alert, cooperative and no distress  HEENT: Normocephalic, without obvious abnormality, atraumatic Eyes: negative Throat: lips, mucosa, and tongue normal; teeth and gums normal  Respiratory: Clear to auscultation bilaterally  CV: Regular rate and rhythm  Breasts:  Normal appearance, no masses or tenderness, no nipple retraction or dimpling  GI: Soft, non-tender; bowel sounds normal; no masses,  no organomegaly  GU: External Genitalia:  Tanner V, no lesion Urethra:  No prolapse   Vagina: Pink, normal rugae, no blood or discharge  Cervix: No CMT, no lesion  Uterus:  Normal size and contour, non tender  Adnexa: Normal, no masses, non tender  Musculoskeletal: No edema, redness or tenderness in the calves or thighs  Skin: No lesions or rash  Lymphatic: Axillary adenopathy: none     Psychiatric: Normal mood and behavior        Assessment:    Healthy  female exam.    Plan:    1.  Pap negative 2023 after 2022 LEEP for low grade 2.  Hirsuit 3.  Bras 4.  Mammogram age 52

## 2023-03-30 DIAGNOSIS — L680 Hirsutism: Secondary | ICD-10-CM | POA: Insufficient documentation

## 2023-04-08 ENCOUNTER — Encounter: Payer: Self-pay | Admitting: Bariatrics

## 2023-04-08 ENCOUNTER — Ambulatory Visit: Payer: BC Managed Care – PPO | Admitting: Bariatrics

## 2023-04-08 VITALS — BP 127/83 | HR 102 | Temp 98.2°F | Ht 66.5 in | Wt 287.0 lb

## 2023-04-08 DIAGNOSIS — E66813 Obesity, class 3: Secondary | ICD-10-CM

## 2023-04-08 DIAGNOSIS — E559 Vitamin D deficiency, unspecified: Secondary | ICD-10-CM | POA: Diagnosis not present

## 2023-04-08 DIAGNOSIS — E88819 Insulin resistance, unspecified: Secondary | ICD-10-CM | POA: Diagnosis not present

## 2023-04-08 DIAGNOSIS — Z6841 Body Mass Index (BMI) 40.0 and over, adult: Secondary | ICD-10-CM

## 2023-04-08 MED ORDER — VITAMIN D (ERGOCALCIFEROL) 1.25 MG (50000 UNIT) PO CAPS: 50000.0000 [IU] | ORAL_CAPSULE | ORAL | 0 refills | Status: DC

## 2023-04-08 NOTE — Progress Notes (Signed)
WEIGHT SUMMARY AND BIOMETRICS  Weight Lost Since Last Visit: 2lb  Weight Gained Since Last Visit: 0   Vitals Temp: 98.2 F (36.8 C) BP: 127/83 Pulse Rate: (!) 102 SpO2: 90 %   Anthropometric Measurements Height: 5' 6.5" (1.689 m) Weight: 287 lb (130.2 kg) BMI (Calculated): 45.63 Weight at Last Visit: 289lb Weight Lost Since Last Visit: 2lb Weight Gained Since Last Visit: 0 Starting Weight: 292lb Total Weight Loss (lbs): 5 lb (2.268 kg)   Body Composition  Body Fat %: 52.4 % Fat Mass (lbs): 150.8 lbs Muscle Mass (lbs): 130 lbs Total Body Water (lbs): 101.6 lbs Visceral Fat Rating : 16   Other Clinical Data Fasting: no Labs: no Today's Visit #: 3 Starting Date: 03/09/23    OBESITY Regis is here to discuss her progress with her obesity treatment plan along with follow-up of her obesity related diagnoses.    Nutrition Plan: the Category 3 plan - 95% adherence.  Current exercise: none  Interim History:  She is down 2 lbs since her last visit.  Protein intake is less than prescribed., Is not skipping meals, and Water intake is adequate. She will download " My Fitness Pal".   Hunger is moderately controlled.  Cravings are moderately controlled.  Assessment/Plan:   Vitamin D Deficiency Vitamin D is not at goal of 50.  Most recent vitamin D level was 12.7.  She is doing better getting up in the morning.  She is on  prescription ergocalciferol 50,000 IU weekly. Lab Results  Component Value Date   VD25OH 12.7 (L) 03/09/2023    Plan: Refill prescription vitamin D 50,000 IU weekly.   Insulin Resistance Rahaf has had elevated fasting insulin readings. Goal is HgbA1c < 5.7, fasting insulin at l0 or less, and preferably at 5.  She denies polyphagia. Medication(s): none Lab Results  Component Value Date   HGBA1C 5.5 12/23/2022   Lab  Results  Component Value Date   INSULIN 27.1 (H) 03/09/2023    Plan Will work on the agreed upon plan. Will minimize refined carbohydrates ( sweets and starches), and focus more on complex carbohydrates.  Increase the micronutrients found in leafy greens, which include magnesium, polyphenols, and vitamin C which have been postulated to help with insulin sensitivity. Minimize "fast food" and cook more meals at home.  Increase fiber to 25 to 30 grams daily.  Will have a protein sheet in the am.     Morbid Obesity: Current BMI BMI (Calculated): 45.63  Denay is currently in the action stage of change. As such, her goal is to continue with weight loss efforts.  She has agreed to the Category 3 plan. She will do more journaling.   Exercise goals: For additional and more extensive health benefits, adults should increase their aerobic physical activity to 300 minutes (5 hours) a week of moderate-intensity, or 150 minutes a week  of vigorous-intensity aerobic physical activity, or an equivalent combination of moderate- and vigorous-intensity activity. Additional health benefits are gained by engaging in physical activity beyond this amount.  She got her walking pad and will start the exercise.   Behavioral modification strategies: increasing lean protein intake, meal planning , increase water intake, better snacking choices, increasing fiber rich foods, avoiding temptations, and mindful eating.  Izzie has agreed to follow-up with our clinic in 3 weeks.      Objective:   VITALS: Per patient if applicable, see vitals. GENERAL: Alert and in no acute distress. CARDIOPULMONARY: No increased WOB. Speaking in clear sentences.  PSYCH: Pleasant and cooperative. Speech normal rate and rhythm. Affect is appropriate. Insight and judgement are appropriate. Attention is focused, linear, and appropriate.  NEURO: Oriented as arrived to appointment on time with no prompting.   Attestation Statements:     This was prepared with the assistance of Engineer, civil (consulting).  Occasional wrong-word or sound-a-like substitutions may have occurred due to the inherent limitations of voice recognition   Corinna Capra, DO

## 2023-04-17 ENCOUNTER — Other Ambulatory Visit: Payer: Self-pay | Admitting: Bariatrics

## 2023-04-19 ENCOUNTER — Ambulatory Visit: Payer: BC Managed Care – PPO

## 2023-04-19 ENCOUNTER — Other Ambulatory Visit: Payer: Self-pay | Admitting: Bariatrics

## 2023-04-19 DIAGNOSIS — L680 Hirsutism: Secondary | ICD-10-CM | POA: Diagnosis not present

## 2023-04-19 DIAGNOSIS — E282 Polycystic ovarian syndrome: Secondary | ICD-10-CM

## 2023-04-19 DIAGNOSIS — N854 Malposition of uterus: Secondary | ICD-10-CM | POA: Diagnosis not present

## 2023-04-27 ENCOUNTER — Telehealth (INDEPENDENT_AMBULATORY_CARE_PROVIDER_SITE_OTHER): Payer: Self-pay | Admitting: Bariatrics

## 2023-04-27 NOTE — Telephone Encounter (Signed)
 1/7 Pt called to cx appt. Pt will no longer be in the program and reports she will going with another program due to financial reasons. AIR

## 2023-04-28 ENCOUNTER — Ambulatory Visit: Payer: BC Managed Care – PPO | Admitting: Bariatrics

## 2023-05-05 ENCOUNTER — Ambulatory Visit: Payer: BC Managed Care – PPO

## 2023-05-05 DIAGNOSIS — Z1231 Encounter for screening mammogram for malignant neoplasm of breast: Secondary | ICD-10-CM | POA: Diagnosis not present

## 2023-05-05 DIAGNOSIS — Z01419 Encounter for gynecological examination (general) (routine) without abnormal findings: Secondary | ICD-10-CM

## 2023-09-10 ENCOUNTER — Other Ambulatory Visit: Payer: Self-pay | Admitting: Physician Assistant

## 2023-09-10 DIAGNOSIS — F411 Generalized anxiety disorder: Secondary | ICD-10-CM

## 2023-09-10 DIAGNOSIS — N94819 Vulvodynia, unspecified: Secondary | ICD-10-CM

## 2023-09-10 DIAGNOSIS — F330 Major depressive disorder, recurrent, mild: Secondary | ICD-10-CM

## 2023-09-10 MED ORDER — DULOXETINE HCL 20 MG PO CPEP: 40.0000 mg | ORAL_CAPSULE | Freq: Every day | ORAL | 0 refills | Status: DC

## 2023-09-30 ENCOUNTER — Encounter: Payer: Self-pay | Admitting: Physician Assistant

## 2023-10-05 ENCOUNTER — Telehealth (INDEPENDENT_AMBULATORY_CARE_PROVIDER_SITE_OTHER): Admitting: Physician Assistant

## 2023-10-05 ENCOUNTER — Encounter: Payer: Self-pay | Admitting: Physician Assistant

## 2023-10-05 VITALS — BP 130/78 | Ht 66.0 in | Wt 285.0 lb

## 2023-10-05 DIAGNOSIS — F411 Generalized anxiety disorder: Secondary | ICD-10-CM

## 2023-10-05 DIAGNOSIS — N94819 Vulvodynia, unspecified: Secondary | ICD-10-CM

## 2023-10-05 DIAGNOSIS — K146 Glossodynia: Secondary | ICD-10-CM | POA: Insufficient documentation

## 2023-10-05 DIAGNOSIS — K140 Glossitis: Secondary | ICD-10-CM | POA: Insufficient documentation

## 2023-10-05 DIAGNOSIS — F330 Major depressive disorder, recurrent, mild: Secondary | ICD-10-CM

## 2023-10-05 MED ORDER — DULOXETINE HCL 20 MG PO CPEP: 40.0000 mg | ORAL_CAPSULE | Freq: Every day | ORAL | 1 refills | Status: DC

## 2023-10-05 MED ORDER — MAGIC MOUTHWASH W/LIDOCAINE: 15.0000 mL | Freq: Four times a day (QID) | ORAL | 1 refills | Status: DC | PRN

## 2023-10-05 NOTE — Progress Notes (Signed)
 ..Virtual Visit via Video Note  I connected with [name] on 10/05/23 at  8:10 AM EDT by a video enabled telemedicine application and verified that I am speaking with the correct person using two identifiers.  Location: Patient: home Provider: clinic  .Aaron AasParticipating in visit:  Patient: Janet Clayton Provider: Sandy Crumb PA-C   I discussed the limitations of evaluation and management by telemedicine and the availability of in person appointments. The patient expressed understanding and agreed to proceed.  History of Present Illness: Pt is a 38 yo female who calls into the clinic to discuss ongoing tongue pain/ulcers/lesions for the last 8 months on the tip of her tongue. She first noticed the pain after eating chips and salsa and thought the food must have been too spicy. The pain grew and continued. She tried eliminating foods that made symptoms worse but really almost everything makes it worse. Saltly/sweet/fried seemed to be the most bothersome. She has been working with her dentist to treat. She tried valtrex  which did not help at all. She stopped using listerine. She never changed her toothpaste but had used for years. She has not started or stopped any medications. Salt water rinses help some. Chlorhexidine has helped some. Lesions do not bleed and they come and go. The tip of her tongue burns intermittently but daily.     Observations/Objective: Tip of her tongue is erythematous but no vesicles/masses/lesions/ulcers today.   She does have pictures in mychart that show macular circular ulcerations on the tip of the tongue and erythema with clear vesicles.     Assessment and Plan: Aaron AasAaron AasRuie was seen today for medical management of chronic issues.  Diagnoses and all orders for this visit:  Tongue inflammation -     VITAMIN D  25 Hydroxy (Vit-D Deficiency, Fractures) -     B12 and Folate Panel -     CBC w/Diff/Platelet -     Fe+TIBC+Fer -     Zinc -     TSH + free T4 -      CMP14+EGFR -     Vitamin B6 -     Vitamin B1 -     magic mouthwash w/lidocaine  SOLN; Take 15 mLs by mouth 4 (four) times daily as needed for mouth pain. Swish, gargle and spit.  Tongue pain -     VITAMIN D  25 Hydroxy (Vit-D Deficiency, Fractures) -     B12 and Folate Panel -     CBC w/Diff/Platelet -     Fe+TIBC+Fer -     Zinc -     TSH + free T4 -     CMP14+EGFR -     Vitamin B6 -     Vitamin B1 -     magic mouthwash w/lidocaine  SOLN; Take 15 mLs by mouth 4 (four) times daily as needed for mouth pain. Swish, gargle and spit.  Tongue ulceration -     VITAMIN D  25 Hydroxy (Vit-D Deficiency, Fractures) -     B12 and Folate Panel -     CBC w/Diff/Platelet -     Fe+TIBC+Fer -     Zinc -     TSH + free T4 -     CMP14+EGFR -     Vitamin B6 -     Vitamin B1 -     magic mouthwash w/lidocaine  SOLN; Take 15 mLs by mouth 4 (four) times daily as needed for mouth pain. Swish, gargle and spit.   Unclear etiology of tongue pain/ulcers/sores will check for vitamin deficiencies  with labs I do not think it is geographical tongue due to appearance and the fact that it is painful.  Magic mouthwash with lidocaine  and benadryl sent to pharmacy to use as needed Continue to avoid certain foods that cause pain to worsen.  Will consider referral to ENT vs oral maxillofacial specialist.     Follow Up Instructions:    I discussed the assessment and treatment plan with the patient. The patient was provided an opportunity to ask questions and all were answered. The patient agreed with the plan and demonstrated an understanding of the instructions.   The patient was advised to call back or seek an in-person evaluation if the symptoms worsen or if the condition fails to improve as anticipated.   Martell Mcfadyen, PA-C

## 2023-10-08 ENCOUNTER — Ambulatory Visit: Payer: Self-pay | Admitting: Physician Assistant

## 2023-10-08 ENCOUNTER — Telehealth: Payer: Self-pay | Admitting: Physician Assistant

## 2023-10-08 ENCOUNTER — Telehealth: Payer: Self-pay

## 2023-10-08 DIAGNOSIS — K146 Glossodynia: Secondary | ICD-10-CM

## 2023-10-08 DIAGNOSIS — K140 Glossitis: Secondary | ICD-10-CM

## 2023-10-08 LAB — CBC WITH DIFFERENTIAL/PLATELET
Eos: 2 %
Hemoglobin: 14.2 g/dL (ref 11.1–15.9)
Immature Grans (Abs): 0 10*3/uL (ref 0.0–0.1)
Monocytes Absolute: 0.5 10*3/uL (ref 0.1–0.9)
RDW: 13 % (ref 11.7–15.4)

## 2023-10-08 LAB — IRON,TIBC AND FERRITIN PANEL: Ferritin: 20 ng/mL (ref 15–150)

## 2023-10-08 LAB — CMP14+EGFR: Bilirubin Total: 0.3 mg/dL (ref 0.0–1.2)

## 2023-10-08 MED ORDER — MAGIC MOUTHWASH W/LIDOCAINE
15.0000 mL | Freq: Four times a day (QID) | ORAL | 1 refills | Status: AC | PRN
Start: 2023-10-08 — End: ?

## 2023-10-08 MED ORDER — VITAMIN D (ERGOCALCIFEROL) 1.25 MG (50000 UNIT) PO CAPS: 50000.0000 [IU] | ORAL_CAPSULE | ORAL | 0 refills | Status: DC

## 2023-10-08 NOTE — Progress Notes (Signed)
 Thyroid  normal.  Hemoglobin normal.  Kidney and liver look good.   Iron still low. Are you taking iron supplement daily?   Vitamin d  improved a little but I am going to add high dose weekly vitamin d  for 3 months and then check labs,.   B12 improved a little but still on low normal lets try b12 shots in office monthly for 3 months and see how tongue inflammation is doing and how you feel.

## 2023-10-08 NOTE — Telephone Encounter (Unsigned)
 Copied from CRM [id number]. Topic: Clinical - Medication Refill >> Oct 08, 2023  8:42 AM Shelby Dessert H wrote: Medication: magic mouthwash w/lidocaine  SOLN  Has the patient contacted their pharmacy? Yes (Agent: If no, request that the patient contact the pharmacy for the refill. If patient does not wish to contact the pharmacy document the reason why and proceed with request.) (Agent: If yes, when and what did the pharmacy advise?)  This is the patient's preferred pharmacy:  [facility] [phone] - [place], [place] - [address]  [place] [address] Phone: [phone] Fax: [phone]  Is this the correct pharmacy for this prescription? Yes If no, delete pharmacy and type the correct one.   Has the prescription been filled recently? Yes  Is the patient out of the medication? Yes  Has the patient been seen for an appointment in the last year OR does the patient have an upcoming appointment? Yes  Can we respond through MyChart? Yes  Agent: Please be advised that Rx refills may take up to 3 business days. We ask that you follow-up with your pharmacy.

## 2023-10-08 NOTE — Telephone Encounter (Signed)
 Janet Clayton with phamacy  Return contact # [phone] Called states they do not have two ingredients requested for the magic mouthwash: Hydrocortisone 100mg   Tetracycline 2mg  Requesting to change ingredients to what is considered regular magic mouthwash combination of  Lidocaine . Bendadryl.  Nystatin  Maalox  Requesting a call back today

## 2023-10-08 NOTE — Telephone Encounter (Signed)
 Pharmacist informed of change

## 2023-10-10 LAB — CBC WITH DIFFERENTIAL/PLATELET
Basophils Absolute: 0.1 10*3/uL (ref 0.0–0.2)
Basos: 1 %
EOS (ABSOLUTE): 0.2 10*3/uL (ref 0.0–0.4)
Hematocrit: 44.3 % (ref 34.0–46.6)
Immature Granulocytes: 0 %
Lymphocytes Absolute: 2.6 10*3/uL (ref 0.7–3.1)
Lymphs: 26 %
MCH: 28.1 pg (ref 26.6–33.0)
MCHC: 32.1 g/dL (ref 31.5–35.7)
MCV: 88 fL (ref 79–97)
Monocytes: 5 %
Neutrophils Absolute: 6.3 10*3/uL (ref 1.4–7.0)
Neutrophils: 66 %
Platelets: 295 10*3/uL (ref 150–450)
RBC: 5.06 x10E6/uL (ref 3.77–5.28)
WBC: 9.7 10*3/uL (ref 3.4–10.8)

## 2023-10-10 LAB — CMP14+EGFR
ALT: 17 IU/L (ref 0–32)
AST: 19 IU/L (ref 0–40)
Albumin: 4.1 g/dL (ref 3.9–4.9)
Alkaline Phosphatase: 112 IU/L (ref 44–121)
BUN/Creatinine Ratio: 14 (ref 9–23)
BUN: 8 mg/dL (ref 6–20)
CO2: 24 mmol/L (ref 20–29)
Calcium: 8.8 mg/dL (ref 8.7–10.2)
Chloride: 102 mmol/L (ref 96–106)
Creatinine, Ser: 0.56 mg/dL — ABNORMAL LOW (ref 0.57–1.00)
Globulin, Total: 2.3 g/dL (ref 1.5–4.5)
Glucose: 101 mg/dL — ABNORMAL HIGH (ref 70–99)
Potassium: 4.4 mmol/L (ref 3.5–5.2)
Sodium: 141 mmol/L (ref 134–144)
Total Protein: 6.4 g/dL (ref 6.0–8.5)
eGFR: 119 mL/min/{1.73_m2} (ref >59)

## 2023-10-10 LAB — TSH+FREE T4
Free T4: 1.03 ng/dL (ref 0.82–1.77)
TSH: 1.82 u[IU]/mL (ref 0.450–4.500)

## 2023-10-10 LAB — IRON,TIBC AND FERRITIN PANEL
Iron Saturation: 10 % — ABNORMAL LOW (ref 15–55)
Iron: 36 ug/dL (ref 27–159)
Total Iron Binding Capacity: 353 ug/dL (ref 250–450)
UIBC: 317 ug/dL (ref 131–425)

## 2023-10-10 LAB — ZINC: Zinc: 56 ug/dL (ref 44–115)

## 2023-10-10 LAB — VITAMIN B6: Vitamin B6: 8.4 ug/L (ref 3.4–65.2)

## 2023-10-10 LAB — VITAMIN B1: Thiamine: 95.8 nmol/L (ref 66.5–200.0)

## 2023-10-10 LAB — B12 AND FOLATE PANEL
Folate: 4.3 ng/mL (ref >3.0)
Vitamin B-12: 258 pg/mL (ref 232–1245)

## 2023-10-10 LAB — VITAMIN D 25 HYDROXY (VIT D DEFICIENCY, FRACTURES): Vit D, 25-Hydroxy: 21.1 ng/mL — ABNORMAL LOW (ref 30.0–100.0)

## 2023-10-27 ENCOUNTER — Ambulatory Visit: Admitting: Physician Assistant

## 2023-10-27 DIAGNOSIS — L853 Xerosis cutis: Secondary | ICD-10-CM | POA: Diagnosis not present

## 2023-10-27 DIAGNOSIS — L820 Inflamed seborrheic keratosis: Secondary | ICD-10-CM | POA: Diagnosis not present

## 2023-11-01 ENCOUNTER — Ambulatory Visit

## 2023-11-01 ENCOUNTER — Encounter: Payer: Self-pay | Admitting: Physician Assistant

## 2023-11-01 VITALS — BP 172/100 | HR 87 | Ht 66.0 in | Wt 285.0 lb

## 2023-11-01 DIAGNOSIS — E538 Deficiency of other specified B group vitamins: Secondary | ICD-10-CM

## 2023-11-01 DIAGNOSIS — R030 Elevated blood-pressure reading, without diagnosis of hypertension: Secondary | ICD-10-CM

## 2023-11-01 MED ORDER — CYANOCOBALAMIN 1000 MCG/ML IJ SOLN
1000.0000 ug | Freq: Once | INTRAMUSCULAR | Status: AC
  Administered 2023-11-01: 1000 ug via INTRAMUSCULAR

## 2023-11-01 NOTE — Progress Notes (Signed)
 Patient is here for a vitamin B12 injection. Denies GI problems or dizziness.   Patient tolerated injection well without complications. Pt is advised to schedule next injection in 30 days.  Location:  RD  Pt's BP was elevated. Per Antoniette, pt to keep BP log at home and return in 2 weeks for NV with records. Pt agrees to treatment plan. States BP may be elevated due to a situation at work that upset her prior to her B12 appt.

## 2023-11-01 NOTE — Patient Instructions (Signed)
 2 week Nurse visit for BP check. Keep log of BP's at home and bring to appt.   1 month return for B12 injection

## 2023-11-15 ENCOUNTER — Ambulatory Visit (INDEPENDENT_AMBULATORY_CARE_PROVIDER_SITE_OTHER)

## 2023-11-15 VITALS — BP 154/99 | HR 84

## 2023-11-15 DIAGNOSIS — R030 Elevated blood-pressure reading, without diagnosis of hypertension: Secondary | ICD-10-CM

## 2023-11-15 MED ORDER — LOSARTAN POTASSIUM 50 MG PO TABS: ORAL_TABLET | ORAL | 0 refills | Status: DC

## 2023-11-15 NOTE — Progress Notes (Signed)
   Established Patient Office Visit  Subjective   Patient ID: Janet Clayton, female    DOB: 05-Mar-1986  Age: 38 y.o. MRN: [id number]  No chief complaint on file.   HPI  Janet Clayton is here for blood pressure check. Denies chest pain, shortness of breath or dizziness. Home readings have been around 150/88.  ROS    Objective:     BP (!) 154/99   Pulse 84   SpO2 99%    Physical Exam   No results found for any visits on 11/15/23.    The ASCVD Risk score (Arnett DK, et al., 2019) failed to calculate for the following reasons:   The 2019 ASCVD risk score is only valid for ages 50 to 52    Assessment & Plan:  Elevated blood pressure - Blood pressure still elevated here and home. Patient advised to start Losartan  50 mg 1/2 tablet for 4 days then a whole tablet daily. Follow up in 2 weeks for blood pressure check on the nurse visit with BMP lab work, per Dr Alvan.   Problem List Items Addressed This Visit       Unprioritized   Elevated blood pressure reading - Primary    Return in about 2 weeks (around 11/29/2023) for nurse visit blood pressure check with BMP lab work. SABRA Pear, Jon Mayor, CMA

## 2023-11-28 ENCOUNTER — Other Ambulatory Visit: Payer: Self-pay | Admitting: Physician Assistant

## 2023-11-28 DIAGNOSIS — G43009 Migraine without aura, not intractable, without status migrainosus: Secondary | ICD-10-CM

## 2023-12-02 ENCOUNTER — Ambulatory Visit (INDEPENDENT_AMBULATORY_CARE_PROVIDER_SITE_OTHER)

## 2023-12-02 VITALS — BP 143/98 | HR 95

## 2023-12-02 DIAGNOSIS — E538 Deficiency of other specified B group vitamins: Secondary | ICD-10-CM

## 2023-12-02 MED ORDER — CYANOCOBALAMIN 1000 MCG/ML IJ SOLN
1000.0000 ug | Freq: Once | INTRAMUSCULAR | Status: AC
  Administered 2023-12-02: 1000 ug via INTRAMUSCULAR

## 2023-12-02 NOTE — Patient Instructions (Signed)
 schedule nurse visit for B12 injection in 30 days.

## 2023-12-02 NOTE — Progress Notes (Signed)
 Patient is here for a B12 injection. Denies muscle cramps, weakness or irregular heart rate. Patient advised to schedule nurse visit for B12 injection in 30 days.Injection was tolerated well by the patient. (See MAR for injection details)       Location: right outer quadrant

## 2023-12-07 ENCOUNTER — Other Ambulatory Visit: Payer: Self-pay | Admitting: Family Medicine

## 2023-12-08 NOTE — Progress Notes (Signed)
 Pt was asymptomatic with BP elevation. Follow up in 2 weeks with BP log to access medication need. Pt was very upset with work situation before coming in today.

## 2023-12-10 NOTE — Telephone Encounter (Signed)
 Requesting rx rf of Losartan  50mg   Last written 11/15/2023 Last OV 11/01/2023 Upcoming appt 01/03/2024

## 2023-12-26 ENCOUNTER — Other Ambulatory Visit: Payer: Self-pay | Admitting: Physician Assistant

## 2024-01-03 ENCOUNTER — Ambulatory Visit (INDEPENDENT_AMBULATORY_CARE_PROVIDER_SITE_OTHER)

## 2024-01-03 VITALS — BP 132/84 | HR 90 | Ht 66.0 in | Wt 302.0 lb

## 2024-01-03 DIAGNOSIS — E538 Deficiency of other specified B group vitamins: Secondary | ICD-10-CM | POA: Diagnosis not present

## 2024-01-03 MED ORDER — CYANOCOBALAMIN 1000 MCG/ML IJ SOLN
1000.0000 ug | Freq: Once | INTRAMUSCULAR | Status: AC
  Administered 2024-01-03: 1000 ug via INTRAMUSCULAR

## 2024-01-03 NOTE — Progress Notes (Signed)
 Patient is in office today for a nurse visit for B12 Injection. Patient Injection was given in the  Left upper quad. gluteus. Patient tolerated injection well.

## 2024-01-26 ENCOUNTER — Encounter: Payer: Self-pay | Admitting: Physician Assistant

## 2024-01-26 DIAGNOSIS — G43009 Migraine without aura, not intractable, without status migrainosus: Secondary | ICD-10-CM

## 2024-01-26 MED ORDER — RIZATRIPTAN BENZOATE 10 MG PO TABS: 10.0000 mg | ORAL_TABLET | ORAL | 5 refills | Status: AC | PRN

## 2024-01-26 NOTE — Telephone Encounter (Signed)
 Requesting rx rf of Rizatriptan  10mg  Last written 12/23/2022 Last OV 10/05/2023 telemedicine - sick visit  Upcoming appt = none

## 2024-02-03 ENCOUNTER — Ambulatory Visit

## 2024-02-03 DIAGNOSIS — E538 Deficiency of other specified B group vitamins: Secondary | ICD-10-CM

## 2024-02-03 MED ORDER — CYANOCOBALAMIN 1000 MCG/ML IJ SOLN
1000.0000 ug | INTRAMUSCULAR | Status: AC
  Administered 2024-02-03 – 2024-05-03 (×4): 1000 ug via INTRAMUSCULAR

## 2024-02-03 NOTE — Progress Notes (Signed)
 Patient is in the office today for her B12 injection. Denies muscle cramps, heart palpitations, or medication changes. Pt comes in every 30 days for her injection.  Pt given B12 injection in her RUOQ. Tolerated well no redness or swelling noted at the site.Pt advised to RTC in 30 days for next injection.

## 2024-02-22 DIAGNOSIS — R059 Cough, unspecified: Secondary | ICD-10-CM | POA: Diagnosis not present

## 2024-02-22 DIAGNOSIS — I10 Essential (primary) hypertension: Secondary | ICD-10-CM | POA: Diagnosis not present

## 2024-02-22 DIAGNOSIS — J029 Acute pharyngitis, unspecified: Secondary | ICD-10-CM | POA: Diagnosis not present

## 2024-02-22 DIAGNOSIS — J069 Acute upper respiratory infection, unspecified: Secondary | ICD-10-CM | POA: Diagnosis not present

## 2024-03-03 NOTE — Progress Notes (Signed)
   Subjective:    Patient ID: Janet Clayton, female    DOB: [birth year], 38 y.o.   MRN: [id number]  HPI  Patient is in the office today for her B12 injection. Denies muscle cramps, heart palpitations, or medication changes. Pt comes in every 30 days for her injection.  Review of Systems     Objective:   Physical Exam        Assessment & Plan:     Pt given B12 injection in her LUOQ. Tolerated well no redness or swelling noted at the site.Pt advised to RTC in 30 days for next injection. (04/05/24)

## 2024-03-06 ENCOUNTER — Ambulatory Visit (INDEPENDENT_AMBULATORY_CARE_PROVIDER_SITE_OTHER)

## 2024-03-06 VITALS — Resp 18 | Ht 66.5 in

## 2024-03-06 DIAGNOSIS — E538 Deficiency of other specified B group vitamins: Secondary | ICD-10-CM | POA: Diagnosis not present

## 2024-03-10 ENCOUNTER — Ambulatory Visit

## 2024-03-10 ENCOUNTER — Telehealth: Payer: Self-pay | Admitting: *Deleted

## 2024-03-10 ENCOUNTER — Ambulatory Visit (INDEPENDENT_AMBULATORY_CARE_PROVIDER_SITE_OTHER): Admitting: Podiatry

## 2024-03-10 ENCOUNTER — Encounter: Payer: Self-pay | Admitting: Podiatry

## 2024-03-10 DIAGNOSIS — R2242 Localized swelling, mass and lump, left lower limb: Secondary | ICD-10-CM | POA: Diagnosis not present

## 2024-03-10 DIAGNOSIS — M722 Plantar fascial fibromatosis: Secondary | ICD-10-CM

## 2024-03-10 DIAGNOSIS — M7989 Other specified soft tissue disorders: Secondary | ICD-10-CM

## 2024-03-10 NOTE — Telephone Encounter (Signed)
 I called and asked the patient to come in a few minutes early because we're going to need xrays of her foot.  She stated she would do her best because she's watching her mother who has Alzheimers until her dad comes back.

## 2024-03-10 NOTE — Progress Notes (Signed)
  Subjective:  Patient ID: Janet Clayton, female    DOB: 19-Dec-1985,   MRN: [id number]  Chief Complaint  Patient presents with   Foot Pain    I've had this spot on my left foot on the bottom for several years.  It gets flared up and I can hardly put weight on it.  It's like a blue bump on the bottom of my foot.    38 y.o. female presents for concern of spot on the bottom of her left foot around her heel that has been present for several years.  She relates mostly it is not a problem but every now and then it will flareup and it will be hard to put weight on.  She relates when it does flare it turns blue on the bottom of her foot.. Denies any other pedal complaints. Denies n/v/f/c.   Past Medical History:  Diagnosis Date   Anxiety    Back pain    Depression    Depression    High blood pressure    Swelling of lower extremity    Vaginal Pap smear, abnormal     Objective:  Physical Exam: Vascular: DP/PT pulses 2/4 bilateral. CFT <3 seconds. Normal hair growth on digits. No edema.  Skin. No lacerations or abrasions bilateral feet.  Soft tissue mass noted to plantar foot just distal to the heel.  The mass is soft and minimally tender.  Mild blue discoloration noted underlying the skin surface. Musculoskeletal: MMT 5/5 bilateral lower extremities in DF, PF, Inversion and Eversion. Deceased ROM in DF of ankle joint.  Neurological: Sensation intact to light touch.   Assessment:   1. Soft tissue mass      Plan:  Patient was evaluated and treated and all questions answered. X-rays reviewed and discussed with patient. Discussed soft tissue massesand treatment options with the patient. Discussed fibromas and ganglion cysts.  Discussed this does not necessarily have the appearance of either 1 of those.  Unclear etiology of what this mass could be.  Discussed best next option is getting an MRI to further evaluate. MRI ordered. Patient to follow-up after MRI    Asberry Failing, DPM

## 2024-03-14 ENCOUNTER — Other Ambulatory Visit: Payer: Self-pay | Admitting: Physician Assistant

## 2024-03-17 ENCOUNTER — Other Ambulatory Visit: Payer: Self-pay | Admitting: Physician Assistant

## 2024-04-04 NOTE — Progress Notes (Unsigned)
° °  Subjective:    Patient ID: Janet Clayton, female    DOB: September 27, 1985, 38 y.o.   MRN: [id number]  HPI  Patient is in the office today for her B12 injection. Denies muscle cramps, heart palpitations, or medication changes. Pt comes in every 30 days for her injection.   Review of Systems     Objective:   Physical Exam        Assessment & Plan:   Pt given B12 injection in her RUOQ. Tolerated well no redness or swelling noted at the site.Pt advised to RTC in 30 days for next injection. (Around 05/06/23) Patients BP was slightly elevated today at 152/89 let sit for 5 mins and took it again is was 150/75. Reported to PCP vermell who would like patient to monitor at home and we will recheck it when she returns for her next B12.

## 2024-04-05 ENCOUNTER — Ambulatory Visit

## 2024-04-05 VITALS — BP 150/75 | HR 70 | Resp 18 | Ht 66.0 in | Wt 285.0 lb

## 2024-04-05 DIAGNOSIS — E538 Deficiency of other specified B group vitamins: Secondary | ICD-10-CM

## 2024-04-07 NOTE — Telephone Encounter (Signed)
 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

## 2024-04-11 ENCOUNTER — Encounter: Payer: Self-pay | Admitting: Podiatry

## 2024-04-17 ENCOUNTER — Other Ambulatory Visit

## 2024-04-17 ENCOUNTER — Telehealth: Payer: Self-pay | Admitting: Podiatry

## 2024-04-17 ENCOUNTER — Other Ambulatory Visit: Payer: Self-pay | Admitting: Podiatry

## 2024-04-17 DIAGNOSIS — M7989 Other specified soft tissue disorders: Secondary | ICD-10-CM

## 2024-04-17 NOTE — Telephone Encounter (Signed)
 Spoke to patient is aware of change.

## 2024-04-17 NOTE — Telephone Encounter (Signed)
 Patient called in about her MRI. She says they were unable to do the MRI because the order was put in for the wrong foot.

## 2024-04-19 ENCOUNTER — Inpatient Hospital Stay: Admission: RE | Admit: 2024-04-19 | Discharge: 2024-04-19 | Attending: Podiatry

## 2024-04-19 DIAGNOSIS — M7989 Other specified soft tissue disorders: Secondary | ICD-10-CM

## 2024-04-23 ENCOUNTER — Other Ambulatory Visit: Payer: Self-pay | Admitting: Physician Assistant

## 2024-04-23 DIAGNOSIS — F411 Generalized anxiety disorder: Secondary | ICD-10-CM

## 2024-04-23 DIAGNOSIS — N94819 Vulvodynia, unspecified: Secondary | ICD-10-CM

## 2024-04-23 DIAGNOSIS — F330 Major depressive disorder, recurrent, mild: Secondary | ICD-10-CM

## 2024-05-03 ENCOUNTER — Ambulatory Visit (INDEPENDENT_AMBULATORY_CARE_PROVIDER_SITE_OTHER)

## 2024-05-03 VITALS — BP 140/94 | HR 80 | Resp 20 | Ht 66.0 in | Wt 301.0 lb

## 2024-05-03 DIAGNOSIS — E538 Deficiency of other specified B group vitamins: Secondary | ICD-10-CM | POA: Diagnosis not present

## 2024-05-03 NOTE — Progress Notes (Signed)
" ° °  Subjective:    Patient ID: Janet Clayton, female    DOB: [birth year], 39 y.o.   MRN: [id number]  HPI  Patient is in the office today for her B12 injection. Denies muscle cramps, heart palpitations, or medication changes. Pt comes in every 30 days for her injection.   Review of Systems     Objective:   Physical Exam        Assessment & Plan:   Pt given B12 injection in her LUOQ. Tolerated well no redness or swelling noted at the site.Pt advised to RTC in 30 days for next injection. (Around 06/03/23)  "

## 2024-06-05 ENCOUNTER — Ambulatory Visit

## 2024-06-06 ENCOUNTER — Ambulatory Visit: Admitting: Obstetrics and Gynecology
